# Patient Record
Sex: Male | Born: 2000 | Hispanic: Yes | Marital: Single | Smoking: Current every day smoker
Health system: Southern US, Community
[De-identification: ages and names within clinical notes are randomized; demographics above are authoritative.]

## PROBLEM LIST (undated history)

## (undated) DIAGNOSIS — F32A Depression, unspecified: Secondary | ICD-10-CM

---
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
## 2021-05-31 ENCOUNTER — Emergency Department
Admission: EM | Admit: 2021-05-31 | Discharge: 2021-05-31 | Disposition: A | Discharge status: Home / Self Care | Payer: Medicaid Other | Attending: Emergency Medicine | Admitting: Emergency Medicine

## 2021-05-31 ENCOUNTER — Other Ambulatory Visit: Payer: Self-pay

## 2021-05-31 DIAGNOSIS — F1910 Other psychoactive substance abuse, uncomplicated: Secondary | ICD-10-CM

## 2021-05-31 DIAGNOSIS — F1994 Other psychoactive substance use, unspecified with psychoactive substance-induced mood disorder: Secondary | ICD-10-CM | POA: Insufficient documentation

## 2021-05-31 DIAGNOSIS — R45851 Suicidal ideations: Secondary | ICD-10-CM | POA: Diagnosis not present

## 2021-05-31 DIAGNOSIS — Z20822 Contact with and (suspected) exposure to covid-19: Secondary | ICD-10-CM | POA: Insufficient documentation

## 2021-05-31 LAB — CBC
HCT: 45.7 % (ref 39.0–52.0)
Hemoglobin: 15.7 g/dL (ref 13.0–17.0)
MCH: 29.9 pg (ref 26.0–34.0)
MCHC: 34.4 g/dL (ref 30.0–36.0)
MCV: 87 fL (ref 80.0–100.0)
Platelets: 194 10*3/uL (ref 150–400)
RBC: 5.25 MIL/uL (ref 4.22–5.81)
RDW: 13.2 % (ref 11.5–15.5)
WBC: 6.7 10*3/uL (ref 4.0–10.5)
nRBC: 0 % (ref 0.0–0.2)

## 2021-05-31 LAB — URINALYSIS, COMPLETE (UACMP) WITH MICROSCOPIC
Bacteria, UA: NONE SEEN
Bilirubin Urine: NEGATIVE
Glucose, UA: NEGATIVE mg/dL
Hgb urine dipstick: NEGATIVE
Ketones, ur: NEGATIVE mg/dL
Leukocytes,Ua: NEGATIVE
Nitrite: NEGATIVE
Protein, ur: 30 mg/dL — AB
Specific Gravity, Urine: 1.019 (ref 1.005–1.030)
Squamous Epithelial / HPF: NONE SEEN (ref 0–5)
pH: 9 — ABNORMAL HIGH (ref 5.0–8.0)

## 2021-05-31 LAB — RESP PANEL BY RT-PCR (FLU A&B, COVID) ARPGX2
Influenza A by PCR: NEGATIVE
Influenza B by PCR: NEGATIVE
SARS Coronavirus 2 by RT PCR: NEGATIVE

## 2021-05-31 LAB — ACETAMINOPHEN LEVEL: Acetaminophen (Tylenol), Serum: <10 ug/mL — ABNORMAL LOW (ref 10–30)

## 2021-05-31 LAB — COMPREHENSIVE METABOLIC PANEL
ALT: 18 U/L (ref 0–44)
AST: 16 U/L (ref 15–41)
Albumin: 4.6 g/dL (ref 3.5–5.0)
Alkaline Phosphatase: 95 U/L (ref 38–126)
Anion gap: 11 (ref 5–15)
BUN: 13 mg/dL (ref 6–20)
CO2: 28 mmol/L (ref 22–32)
Calcium: 9.3 mg/dL (ref 8.9–10.3)
Chloride: 101 mmol/L (ref 98–111)
Creatinine, Ser: 0.83 mg/dL (ref 0.61–1.24)
GFR, Estimated: >60 mL/min (ref >60)
Glucose, Bld: 78 mg/dL (ref 70–99)
Potassium: 4.1 mmol/L (ref 3.5–5.1)
Sodium: 140 mmol/L (ref 135–145)
Total Bilirubin: 0.7 mg/dL (ref 0.3–1.2)
Total Protein: 7.4 g/dL (ref 6.5–8.1)

## 2021-05-31 LAB — ETHANOL: Alcohol, Ethyl (B): <10 mg/dL (ref <10)

## 2021-05-31 LAB — URINE DRUG SCREEN, QUALITATIVE (ARMC ONLY)
Amphetamines, Ur Screen: NOT DETECTED
Barbiturates, Ur Screen: NOT DETECTED
Benzodiazepine, Ur Scrn: NOT DETECTED
Cannabinoid 50 Ng, Ur ~~LOC~~: POSITIVE — AB
Cocaine Metabolite,Ur ~~LOC~~: NOT DETECTED
MDMA (Ecstasy)Ur Screen: NOT DETECTED
Methadone Scn, Ur: NOT DETECTED
Opiate, Ur Screen: NOT DETECTED
Phencyclidine (PCP) Ur S: NOT DETECTED
Tricyclic, Ur Screen: NOT DETECTED

## 2021-05-31 LAB — RAPID HIV SCREEN (HIV 1/2 AB+AG)
HIV 1/2 Antibodies: NONREACTIVE
HIV-1 P24 Antigen - HIV24: NONREACTIVE
Interpretation (HIV Ag Ab): NONREACTIVE

## 2021-05-31 LAB — SALICYLATE LEVEL: Salicylate Lvl: <7 mg/dL — ABNORMAL LOW (ref 7.0–30.0)

## 2021-05-31 NOTE — BH Assessment (Signed)
Comprehensive Clinical Assessment (CCA) Note  05/31/2021 Edward Shelton [id number] Recommendations for Services/Supports/Treatments: Pending psych consult/disposition.   Edward Shelton Edward Shelton is a 21 year old, English speaking, Hispanic male with no known psych hx. Pt presented to [facility] ED voluntarily to get medications for suspected bipolar symptoms. Pt explained that he'd presented to the ED due to worsening thoughts of SI. Pt reported that he'd considered jumping from a building prior to arrival which caused him great concern and catalyzed his decision to seek help. Pt described the thoughts as increasingly intrusive. On assessment, pt. admits to abusing Ketamine, acid, shrooms, molly, and MDMA as a form of self-medicating. Pt reported that he uses 1 gram of Ketamine approximately 1-2x peer week. Pt explained that his last use was 3 days ago 05/28/21, and that he'd been extremely depressed since his last use. Pt reported that he drinks alcohol and cannabis occasionally, explaining that he has a take it or leave it attitude. Pt identified having tremors when he does not use substances. Pt explained that he has a supportive family and girlfriend. Pt has stable housing, explaining that he rents a room from a friend. Pt had good insight into his substance abuse issues and expressed concerns about the ways in which ketamine abuse may have affected his bladder. Pt was cooperative throughout the assessment. Pt had clear and logical speech. Pt was oriented x4. Pt had an anxious mood and affect. The pt. did not appear to be responding to internal/external stimuli. The patient denied current SI, HI or AV/H.   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 Self  Referral name: No data  recorded Referral phone number: No data recorded  Whom do you see for routine medical problems? No data recorded Practice/Facility Name: No data recorded Practice/Facility Phone Number: No data recorded Name of Contact: No data recorded Contact Number: No data recorded Contact Fax Number: No data recorded Prescriber Name: No data recorded Prescriber Address (if known): No data recorded  What Is the Reason for Your Visit/Call Today? Pt states he thinks he is bipolar and is here to get "medication". Pt states he has been suicidal and has been using a "drug to cope that I think is messing up my bladder". Pt states has been taking ketamine.  How Long Has This Been Causing You Problems? > than 6 months  What Do You Feel Would Help You the Most Today? Treatment for Depression or other mood problem; Medication(s)   Have You Recently Been in Any Inpatient Treatment (Hospital/Detox/Crisis Center/28-Day Program)? No data recorded Name/Location of Program/Hospital:No data recorded How Long Were You There? No data recorded When Were You Discharged? No data recorded  Have You Ever Received Services From [facility] Before? No data recorded Who Do You See at [facility]? No data recorded  Have You Recently Had Any Thoughts About Hurting Yourself? Yes  Are You Planning to Commit Suicide/Harm Yourself At This time? No   Have you Recently Had Thoughts About Joice? No  Explanation: No data recorded  Have You Used Any Alcohol or Drugs in the Past 24 Hours? No  How Long Ago Did You Use Drugs or Alcohol? No data recorded What Did You Use and How Much? No data recorded  Do You Currently Have a Therapist/Psychiatrist? No  Name  of Therapist/Psychiatrist: No data recorded  Have You Been Recently Discharged From Any Office Practice or Programs? No  Explanation of Discharge From Practice/Program: No data recorded    CCA Screening Triage Referral Assessment Type of Contact:  Face-to-Face  Is this Initial or Reassessment? No data recorded Date Telepsych consult ordered in [facility]:  No data recorded Time Telepsych consult ordered in [facility]:  No data recorded  Patient Reported Information Reviewed? No data recorded Patient Left Without Being Seen? No data recorded Reason for Not Completing Assessment: No data recorded  Collateral Involvement: None provided   Does Patient Have a [place]? No data recorded Name and Contact of Legal Guardian: No data recorded If Minor and Not Living with Parent(s), Who has Custody? n/a  Is CPS involved or ever been involved? Never  Is APS involved or ever been involved? Never   Patient Determined To Be At Risk for Harm To Self or Others Based on Review of Patient Reported Information or Presenting Complaint? No  Method: No data recorded Availability of Means: No data recorded Intent: No data recorded Notification Required: No data recorded Additional Information for Danger to Others Potential: No data recorded Additional Comments for Danger to Others Potential: No data recorded Are There Guns or Other Weapons in Your Home? No data recorded Types of Guns/Weapons: No data recorded Are These Weapons Safely Secured?                            No data recorded Who Could Verify You Are Able To Have These Secured: No data recorded Do You Have any Outstanding Charges, Pending Court Dates, Parole/Probation? No data recorded Contacted To Inform of Risk of Harm To Self or Others: No data recorded  Location of Assessment: [facility] ED   Does Patient Present under Involuntary Commitment? No  IVC Papers Initial File Date: No data recorded  [place] of Residence: [place]   Patient Currently Receiving the Following Services: Not Receiving Services   Determination of Need: Emergent (2 hours)   Options For Referral: ED Referral; Therapeutic Triage Services     CCA Biopsychosocial Intake/Chief Complaint:  No  data recorded Current Symptoms/Problems: No data recorded  Patient Reported Schizophrenia/Schizoaffective Diagnosis in Past: No   Strengths: Pt has good insight; pt is willing to participate in treatment; pt has stable housing.  Preferences: No data recorded Abilities: No data recorded  Type of Services Patient Feels are Needed: No data recorded  Initial Clinical Notes/Concerns: No data recorded  Mental Health Symptoms Depression:   Tearfulness; Hopelessness; Sleep (too much or little)   Duration of Depressive symptoms:  Less than two weeks   Mania:   N/A   Anxiety:    Worrying; Tension   Psychosis:   None   Duration of Psychotic symptoms: No data recorded  Trauma:   Emotional numbing   Obsessions:   None   Compulsions:   "Driven" to perform behaviors/acts; Repeated behaviors/mental acts; Good insight; Intended to reduce stress or prevent another outcome; Disrupts with routine/functioning   Inattention:   None   Hyperactivity/Impulsivity:   None   Oppositional/Defiant Behaviors:   None   Emotional Irregularity:   Mood lability   Other Mood/Personality Symptoms:  No data recorded   Mental Status Exam Appearance and self-care  Stature:   Small   Weight:   Average weight   Clothing:   Casual   Grooming:   Normal   Cosmetic use:  None   Posture/gait:   Normal   Motor activity:   Not Remarkable   Sensorium  Attention:   Normal   Concentration:   Normal   Orientation:   Situation; Place; Person; Object   Recall/memory:   Normal   Affect and Mood  Affect:   Anxious   Mood:   Dysphoric   Relating  Eye contact:   Normal   Facial expression:   Responsive   Attitude toward examiner:   Cooperative   Thought and Language  Speech flow:  Clear and Coherent   Thought content:   Appropriate to Mood and Circumstances   Preoccupation:   None   Hallucinations:   None   Organization:  No data recorded  [employer] of Knowledge:   Average   Intelligence:   Average   Abstraction:   Normal   Judgement:   Fair   Reality Testing:   Adequate   Insight:   Good   Decision Making:   Impulsive   Social Functioning  Social Maturity:   Impulsive   Social Judgement:   Heedless   Stress  Stressors:   Other (Comment) (Substance abuse)   Coping Ability:   Exhausted   Skill Deficits:   Decision making   Supports:   Family; Support needed     Religion: Religion/Spirituality Are You A Religious Person?:  (Not assessed) How Might This Affect Treatment?: Not assessed  Leisure/Recreation: Leisure / Recreation Do You Have Hobbies?: No  Exercise/Diet: Exercise/Diet Do You Exercise?: No Have You Gained or Lost A Significant Amount of Weight in the Past Six Months?: No Do You Follow a Special Diet?: No Do You Have Any Trouble Sleeping?: No   CCA Employment/Education Employment/Work Situation: Employment / Work Situation Employment Situation: Employed Work Stressors: None Patient's Job has Been Impacted by Current Illness: No Has Patient ever Been in Passenger transport manager?: No  Education: Education Is Patient Currently Attending School?: No Last Grade Completed: 12 Did You Nutritional therapist?: No Did You Have An Individualized Education Program (IIEP): No Did You Have Any Difficulty At [employer]?: No Patient's Education Has Been Impacted by Current Illness: No   CCA Family/Childhood History Family and Relationship History: Family history Marital status: Single Does patient have children?: No  Childhood History:  Childhood History By whom was/is the patient raised?: Both parents Did patient suffer any verbal/emotional/physical/sexual abuse as a child?: No Did patient suffer from severe childhood neglect?: No Has patient ever been sexually abused/assaulted/raped as an adolescent or adult?: No Was the patient ever a victim of a crime or a disaster?: No Witnessed  domestic violence?: No Has patient been affected by domestic violence as an adult?: No  Child/Adolescent Assessment:     CCA Substance Use Alcohol/Drug Use: Alcohol / Drug Use Pain Medications: See MAR Prescriptions: See MAR Over the Counter: See MAR History of alcohol / drug use?: Yes Longest period of sobriety (when/how long): Unknown Negative Consequences of Use: Personal relationships Withdrawal Symptoms: Tremors Substance #1 Name of Substance 1: Ketamine 1 - Age of First Use: 19 1 - Amount (size/oz): 1 gram 1 - Frequency: 1-2x per week 1 - Duration: UTA 1 - Last Use / Amount: 05/28/21 Substance #2 Name of Substance 2: Acid 2 - Age of First Use: 16 1/2 2 - Amount (size/oz): Unknown 2 - Frequency: Depends Substance #3 Name of Substance 3: Molly 3 - Age of First Use: UTA 3 - Amount (size/oz): UTA 3 - Frequency: Unknown 3 -  Duration: Unknown 3 - Last Use / Amount: Unknown Substance #4 Name of Substance 4: MDMA 4 - Age of First Use: Unknown 4 - Amount (size/oz): "I abused the fuck out of those" 4 - Frequency: Unknown 4 - Duration: Unknown 4 - Last Use / Amount: Unknown 4 - Method of Aquiring: Unknown 4 - Route of Substance Use: Unknown                 ASAM's:  Six Dimensions of Multidimensional Assessment  Dimension 1:  Acute Intoxication and/or Withdrawal Potential:   Dimension 1:  Description of individual's past and current experiences of substance use and withdrawal: Pt has a hx of polysubstance abuse  Dimension 2:  Biomedical Conditions and Complications:   Dimension 2:  Description of patient's biomedical conditions and  complications: Pt reported physical complications with his bladder due to Ketamine use  Dimension 3:  Emotional, Behavioral, or Cognitive Conditions and Complications:  Dimension 3:  Description of emotional, behavioral, or cognitive conditions and complications: Pt reported emotional lability due to substance abuse  Dimension 4:   Readiness to Change:     Dimension 5:  Relapse, Continued use, or Continued Problem Potential:     Dimension 6:  Recovery/Living Environment:     ASAM Severity Score: ASAM's Severity Rating Score: 20  ASAM Recommended Level of Treatment: ASAM Recommended Level of Treatment: Level III Residential Treatment   Substance use Disorder (SUD) Substance Use Disorder (SUD)  Checklist Symptoms of Substance Use: Continued use despite persistent or recurrent social, interpersonal problems, caused or exacerbated by use, Continued use despite having a persistent/recurrent physical/psychological problem caused/exacerbated by use, Evidence of tolerance, Evidence of withdrawal (Comment), Large amounts of time spent to obtain, use or recover from the substance(s), Persistent desire or unsuccessful efforts to cut down or control use  Recommendations for Services/Supports/Treatments: Recommendations for Services/Supports/Treatments Recommendations For Services/Supports/Treatments: Inpatient Hospitalization  DSM5 Diagnoses: There are no problems to display for this patient.   [place]

## 2021-05-31 NOTE — ED Notes (Signed)
Lab is contacted at this time for add on orders, states that they can complete all.

## 2021-05-31 NOTE — ED Notes (Signed)
Pt reports that he is having intrusive SI, no plan when asked. Reports today he was walking beside a building and thought of going to top and jumping but was able to stop thought. Pt reports this occurring in past and dx of bipolar. Pt is cooperative and seeking help, reports not taking any meds in past that were prescribed. Pt self medicates with many meds including ketamine and mushrooms.

## 2021-05-31 NOTE — Discharge Instructions (Signed)
Please seek medical attention and help for any thoughts about wanting to harm yourself, harm others, any concerning change in behavior, severe depression, inappropriate drug use or any other new or concerning symptoms. ° °

## 2021-05-31 NOTE — ED Triage Notes (Signed)
Pt states he thinks he is bipolar and is here to get "medication". Pt states he has been suicidal and has been using a "drug to cope that I think is messing up my bladder". Pt states has been taking ketamine.

## 2021-05-31 NOTE — ED Notes (Signed)
Patient transferred from Triage to room 19 H  after dressing out and screening for contraband. Report received from RN including situation, background, assessment and recommendations. Pt oriented to [employer] including Q15 minute rounds as well as Psychologist, counselling for their protection. Patient is alert and oriented, warm and dry in no acute distress. Patient denies HI, and AVH. Pt. Encouraged to let this nurse know if needs arise.

## 2021-05-31 NOTE — ED Notes (Signed)
Pt dressed out the following items placed in one of one labeled bag: black sneakers, phone, grey shorts, wallet, keys, black socks, red bracelet, black jacket, yellow metal chain necklace, black t shirt, black boxers. Ed Veterinary surgeon took belonging to quad with pt.

## 2021-05-31 NOTE — ED Provider Notes (Signed)
[facility] Provider Note    Event Date/Time   First MD Initiated Contact with Patient 05/31/21 2155     (approximate)   History   Psychiatric Evaluation and Suicidal   HPI  Edward Shelton is a 21 y.o. male  who  presents to the emergency department today because of concern for suicidal ideation.  Patient states he has been having suicidal thoughts for the past roughly 3 days.  He has not acted on any of these thoughts.  He states that he was told he had bipolar was supposed to be put on medication roughly a year ago from primary care although he never did that.  York Spaniel he has chosen to self medicate with recreational drugs.  States last used about 5 days ago.  Denies any recent medical complaints.   Physical Exam   Triage Vital Signs: ED Triage Vitals  Enc Vitals Group     BP 05/31/21 2055 129/83     Pulse Rate 05/31/21 2055 78     Resp 05/31/21 2055 16     Temp 05/31/21 2055 99 F (37.2 C)     Temp Source 05/31/21 2055 Oral     SpO2 05/31/21 2055 100 %     Weight 05/31/21 2055 120 lb (54.4 kg)     Height 05/31/21 2055 5\' 4"  (1.626 m)     Head Circumference --      Peak Flow --      Pain Score 05/31/21 2127 0     Pain Loc --      Pain Edu? --      Excl. in GC? --     Most recent vital signs: Vitals:   05/31/21 2055  BP: 129/83  Pulse: 78  Resp: 16  Temp: 99 F (37.2 C)  SpO2: 100%    General: Awake, no distress.  CV:  Good peripheral perfusion.  Resp:  Normal effort.  Abd:  No distention.  Psych:  Calm. Good insight. 3   ED Results / Procedures / Treatments   Labs (all labs ordered are listed, but only abnormal results are displayed) Labs Reviewed  URINE DRUG SCREEN, QUALITATIVE ([facility] ONLY) - Abnormal; Notable for the following components:      Result Value   Cannabinoid 50 Ng, Ur [place] POSITIVE (*)    All other components within normal limits  RESP PANEL BY RT-PCR (FLU A&B, COVID) ARPGX2  COMPREHENSIVE METABOLIC PANEL   CBC  ETHANOL  SALICYLATE LEVEL  ACETAMINOPHEN LEVEL     EKG  None   RADIOLOGY None   PROCEDURES:  Critical Care performed: No  Procedures   MEDICATIONS ORDERED IN ED: Medications - No data to display   IMPRESSION / MDM / ASSESSMENT AND PLAN / ED COURSE  I reviewed the triage vital signs and the nursing notes.                              Differential diagnosis includes, but is not limited to, psychiatric illness, drug induced mood disorder.  Patient presented to the emergency department today because of concerns for suicidal ideation. patient has good insight.  Blood work without any concerning abnormalities.  Will have psych and TTS evaluate.  The patient has been placed in psychiatric observation due to the need to provide a safe environment for the patient while obtaining psychiatric consultation and evaluation, as well as ongoing medical and medication management to treat the  patient's condition.  The patient has not been placed under full IVC at this time.    FINAL CLINICAL IMPRESSION(S) / ED DIAGNOSES   Final diagnoses:  Suicidal thoughts     Note:  This document was prepared using Dragon voice recognition software and may include unintentional dictation errors.    [name], MD 05/31/21 [id number]

## 2021-06-01 LAB — CHLAMYDIA/NGC RT PCR (ARMC ONLY)
Chlamydia Tr: NOT DETECTED
N gonorrhoeae: NOT DETECTED

## 2021-06-03 ENCOUNTER — Other Ambulatory Visit: Payer: Self-pay

## 2021-06-03 ENCOUNTER — Emergency Department (EMERGENCY_DEPARTMENT_HOSPITAL)
Admission: EM | Admit: 2021-06-03 | Discharge: 2021-06-03 | Disposition: A | Discharge status: Other Acute Inpatient Hospital | Payer: Medicaid Other | Source: Home / Self Care | Attending: Emergency Medicine | Admitting: Emergency Medicine

## 2021-06-03 ENCOUNTER — Inpatient Hospital Stay
Admission: AD | Admit: 2021-06-03 | Discharge: 2021-06-05 | DRG: 897 | Disposition: A | Discharge status: Home / Self Care | Payer: Medicaid Other | Source: Intra-hospital | Attending: Psychiatry | Admitting: Psychiatry

## 2021-06-03 DIAGNOSIS — Y900 Blood alcohol level of less than 20 mg/100 ml: Secondary | ICD-10-CM | POA: Insufficient documentation

## 2021-06-03 DIAGNOSIS — F1910 Other psychoactive substance abuse, uncomplicated: Secondary | ICD-10-CM | POA: Diagnosis not present

## 2021-06-03 DIAGNOSIS — F1914 Other psychoactive substance abuse with psychoactive substance-induced mood disorder: Principal | ICD-10-CM | POA: Diagnosis present

## 2021-06-03 DIAGNOSIS — F1210 Cannabis abuse, uncomplicated: Secondary | ICD-10-CM | POA: Diagnosis present

## 2021-06-03 DIAGNOSIS — R45851 Suicidal ideations: Secondary | ICD-10-CM | POA: Diagnosis present

## 2021-06-03 DIAGNOSIS — F331 Major depressive disorder, recurrent, moderate: Secondary | ICD-10-CM | POA: Insufficient documentation

## 2021-06-03 DIAGNOSIS — F1919 Other psychoactive substance abuse with unspecified psychoactive substance-induced disorder: Secondary | ICD-10-CM | POA: Insufficient documentation

## 2021-06-03 DIAGNOSIS — F39 Unspecified mood [affective] disorder: Secondary | ICD-10-CM | POA: Diagnosis present

## 2021-06-03 DIAGNOSIS — Z20822 Contact with and (suspected) exposure to covid-19: Secondary | ICD-10-CM | POA: Diagnosis present

## 2021-06-03 DIAGNOSIS — F1610 Hallucinogen abuse, uncomplicated: Secondary | ICD-10-CM | POA: Diagnosis present

## 2021-06-03 DIAGNOSIS — F32A Depression, unspecified: Secondary | ICD-10-CM | POA: Diagnosis present

## 2021-06-03 DIAGNOSIS — F322 Major depressive disorder, single episode, severe without psychotic features: Secondary | ICD-10-CM | POA: Diagnosis not present

## 2021-06-03 DIAGNOSIS — F1994 Other psychoactive substance use, unspecified with psychoactive substance-induced mood disorder: Secondary | ICD-10-CM

## 2021-06-03 DIAGNOSIS — F419 Anxiety disorder, unspecified: Secondary | ICD-10-CM | POA: Diagnosis present

## 2021-06-03 LAB — RESP PANEL BY RT-PCR (FLU A&B, COVID) ARPGX2
Influenza A by PCR: NEGATIVE
Influenza B by PCR: NEGATIVE
SARS Coronavirus 2 by RT PCR: NEGATIVE

## 2021-06-03 LAB — URINE DRUG SCREEN, QUALITATIVE (ARMC ONLY)
Amphetamines, Ur Screen: NOT DETECTED
Barbiturates, Ur Screen: NOT DETECTED
Benzodiazepine, Ur Scrn: NOT DETECTED
Cannabinoid 50 Ng, Ur ~~LOC~~: POSITIVE — AB
Cocaine Metabolite,Ur ~~LOC~~: NOT DETECTED
MDMA (Ecstasy)Ur Screen: NOT DETECTED
Methadone Scn, Ur: NOT DETECTED
Opiate, Ur Screen: NOT DETECTED
Phencyclidine (PCP) Ur S: NOT DETECTED
Tricyclic, Ur Screen: NOT DETECTED

## 2021-06-03 LAB — COMPREHENSIVE METABOLIC PANEL
ALT: 18 U/L (ref 0–44)
AST: 20 U/L (ref 15–41)
Albumin: 5.1 g/dL — ABNORMAL HIGH (ref 3.5–5.0)
Alkaline Phosphatase: 103 U/L (ref 38–126)
Anion gap: 7 (ref 5–15)
BUN: 10 mg/dL (ref 6–20)
CO2: 25 mmol/L (ref 22–32)
Calcium: 9.6 mg/dL (ref 8.9–10.3)
Chloride: 104 mmol/L (ref 98–111)
Creatinine, Ser: 0.77 mg/dL (ref 0.61–1.24)
GFR, Estimated: >60 mL/min (ref >60)
Glucose, Bld: 79 mg/dL (ref 70–99)
Potassium: 3.7 mmol/L (ref 3.5–5.1)
Sodium: 136 mmol/L (ref 135–145)
Total Bilirubin: 1.5 mg/dL — ABNORMAL HIGH (ref 0.3–1.2)
Total Protein: 8.4 g/dL — ABNORMAL HIGH (ref 6.5–8.1)

## 2021-06-03 LAB — CBC
HCT: 47.3 % (ref 39.0–52.0)
Hemoglobin: 16.7 g/dL (ref 13.0–17.0)
MCH: 30.4 pg (ref 26.0–34.0)
MCHC: 35.3 g/dL (ref 30.0–36.0)
MCV: 86 fL (ref 80.0–100.0)
Platelets: 212 10*3/uL (ref 150–400)
RBC: 5.5 MIL/uL (ref 4.22–5.81)
RDW: 13 % (ref 11.5–15.5)
WBC: 8.6 10*3/uL (ref 4.0–10.5)
nRBC: 0 % (ref 0.0–0.2)

## 2021-06-03 LAB — ACETAMINOPHEN LEVEL: Acetaminophen (Tylenol), Serum: <10 ug/mL — ABNORMAL LOW (ref 10–30)

## 2021-06-03 LAB — ETHANOL: Alcohol, Ethyl (B): <10 mg/dL (ref <10)

## 2021-06-03 LAB — SALICYLATE LEVEL: Salicylate Lvl: <7 mg/dL — ABNORMAL LOW (ref 7.0–30.0)

## 2021-06-03 MED ORDER — MAGNESIUM HYDROXIDE 400 MG/5ML PO SUSP: 30.0000 mL | Freq: Every day | ORAL | Status: DC | PRN

## 2021-06-03 MED ORDER — ALUM & MAG HYDROXIDE-SIMETH 200-200-20 MG/5ML PO SUSP: 30.0000 mL | ORAL | Status: DC | PRN

## 2021-06-03 MED ORDER — ACETAMINOPHEN 325 MG PO TABS
650.0000 mg | ORAL_TABLET | Freq: Four times a day (QID) | ORAL | Status: DC | PRN
Start: 2021-06-03 — End: 2021-06-05

## 2021-06-03 MED ORDER — TRAZODONE HCL 50 MG PO TABS: 50.0000 mg | ORAL_TABLET | Freq: Every evening | ORAL | Status: DC | PRN

## 2021-06-03 NOTE — ED Notes (Addendum)
Pt. Transferred to BHU , room# 1 from Triage .Patient was screened by security before entering the unit. Report to include Situation, Background, Assessment and Recommendations from ED RN . Pt. Oriented to unit including Q15 minute rounds as well as the security cameras for their protection. Patient is alert and oriented, warm and dry in no acute distress.   ?

## 2021-06-03 NOTE — BH Assessment (Signed)
Comprehensive Clinical Assessment (CCA) Note  06/03/2021 Edward Shelton [id number]  Chief Complaint: Patient is a 21 year old male presenting to [facility] ED voluntarily due to depression. Per triage note Pt states he is having suicidal thoughts with no plan, states "I feel like Im bipolar' states he has strange thoughts, "how does that work" another time he had thoughts of bashing his head into something but did not act on it, states he is just have strange thought, states he was just here for same and followed up with [facility] but they couldn't see him until Friday and didn't feel like he could wait that long. During assessment patient appears alert and oriented x4, calm and cooperative, mood appears depressed. Patient reports having suicidal thoughts "It's just a thought I don't want to do it." Patient reports continues substance use and reports that he feels like he doesn't have a problem with his use. 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 Patient also reports continues use of hallucinogens such as Mushrooms and Ketamine. Patient UDS is positive for Cannabinoids. 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 Self  Referral name: No data recorded Referral phone number: No data recorded  Whom do you see for routine medical problems? No data recorded Practice/Facility Name: No data recorded Practice/Facility Phone Number: No data recorded Name of Contact: No data  recorded Contact Number: No data recorded Contact Fax Number: No data recorded Prescriber Name: No data recorded Prescriber Address (if known): No data recorded  What Is the Reason for Your Visit/Call Today? Patient presents voluntarily due to depression  How Long Has This Been Causing You Problems? > than 6 months  What Do You Feel Would Help You the Most Today? Treatment for Depression or other mood problem   Have You Recently Been in Any Inpatient Treatment (Hospital/Detox/Crisis Center/28-Day Program)? No data recorded Name/Location of Program/Hospital:No data recorded How Long Were You There? No data recorded When Were You Discharged? No data recorded  Have You Ever Received Services From [facility] Before? No data recorded Who Do You See at [facility]? No data recorded  Have You Recently Had Any Thoughts About Hurting Yourself? Yes  Are You Planning to Commit Suicide/Harm Yourself At This time? No   Have you Recently Had Thoughts About Hurting Someone [name]? No  Explanation: No data recorded  Have You Used Any Alcohol or Drugs in the Past 24 Hours? Yes  How Long Ago Did You Use Drugs or Alcohol? No data recorded What Did You Use and How Much? "Mushrooms"   Do You Currently Have a Therapist/Psychiatrist? No  Name of Therapist/Psychiatrist: No data recorded  Have You Been Recently Discharged From Any Office Practice or Programs? No  Explanation of Discharge From Practice/Program: No data recorded    CCA Screening Triage Referral Assessment Type of Contact: Face-to-Face  Is this Initial or Reassessment? No data recorded Date Telepsych consult ordered in [facility]:  No data recorded Time Telepsych consult ordered in [facility]:  No  data recorded  Patient Reported Information Reviewed? No data recorded Patient Left Without Being Seen? No data recorded Reason for Not Completing Assessment: No data recorded  Collateral Involvement: None provided   Does Patient Have a  Court Appointed Legal Guardian? No data recorded Name and Contact of Legal Guardian: No data recorded If Minor and Not Living with Parent(s), Who has Custody? n/a  Is CPS involved or ever been involved? Never  Is APS involved or ever been involved? Never   Patient Determined To Be At Risk for Harm To Self or Others Based on Review of Patient Reported Information or Presenting Complaint? No  Method: No data recorded Availability of Means: No data recorded Intent: No data recorded Notification Required: No data recorded Additional Information for Danger to Others Potential: No data recorded Additional Comments for Danger to Others Potential: No data recorded Are There Guns or Other Weapons in Your Home? No data recorded Types of Guns/Weapons: No data recorded Are These Weapons Safely Secured?                            No data recorded Who Could Verify You Are Able To Have These Secured: No data recorded Do You Have any Outstanding Charges, Pending Court Dates, Parole/Probation? No data recorded Contacted To Inform of Risk of Harm To Self or Others: No data recorded  Location of Assessment: [facility] ED   Does Patient Present under Involuntary Commitment? No  IVC Papers Initial File Date: No data recorded  [place] of Residence: [place]   Patient Currently Receiving the Following Services: Not Receiving Services   Determination of Need: Emergent (2 hours)   Options For Referral: ED Referral; Therapeutic Triage Services     CCA Biopsychosocial Intake/Chief Complaint:  No data recorded Current Symptoms/Problems: No data recorded  Patient Reported Schizophrenia/Schizoaffective Diagnosis in Past: No   Strengths: Pt has good insight; pt is willing to participate in treatment; pt has stable housing.  Preferences: No data recorded Abilities: No data recorded  Type of Services Patient Feels are Needed: No data recorded  Initial Clinical Notes/Concerns: No data  recorded  Mental Health Symptoms Depression:   Tearfulness; Hopelessness; Sleep (too much or little); Change in energy/activity   Duration of Depressive symptoms:  Greater than two weeks   Mania:   N/A   Anxiety:    Worrying; Tension   Psychosis:   None   Duration of Psychotic symptoms: No data recorded  Trauma:   Emotional numbing   Obsessions:   None   Compulsions:   Good insight; Intended to reduce stress or prevent another outcome   Inattention:   None   Hyperactivity/Impulsivity:   None   Oppositional/Defiant Behaviors:   None   Emotional Irregularity:   Chronic feelings of emptiness   Other Mood/Personality Symptoms:  No data recorded   Mental Status Exam Appearance and self-care  Stature:   Small   Weight:   Average weight   Clothing:   Casual   Grooming:   Normal   Cosmetic use:   None   Posture/gait:   Normal   Motor activity:   Not Remarkable   Sensorium  Attention:   Normal   Concentration:   Normal   Orientation:   Situation; Place; Person; Object   Recall/memory:   Normal   Affect and Mood  Affect:   Depressed   Mood:   Depressed   Relating  Eye contact:   Normal  Facial expression:   Responsive   Attitude toward examiner:   Cooperative   Thought and Language  Speech flow:  Clear and Coherent   Thought content:   Appropriate to Mood and Circumstances   Preoccupation:   None   Hallucinations:   None   Organization:  No data recorded  [employer] of Knowledge:   Average   Intelligence:   Average   Abstraction:   Normal   Judgement:   Fair   Reality Testing:   Adequate   Insight:   Good   Decision Making:   Normal   Social Functioning  Social Maturity:   Isolates   Social Judgement:   Normal   Stress  Stressors:   Other (Comment) (Substance abuse)   Coping Ability:   Exhausted   Skill Deficits:   None   Supports:   Family; Support needed      Religion: Religion/Spirituality Are You A Religious Person?:  (Not assessed) How Might This Affect Treatment?: Not assessed  Leisure/Recreation: Leisure / Recreation Do You Have Hobbies?: No  Exercise/Diet: Exercise/Diet Do You Exercise?: No Have You Gained or Lost A Significant Amount of Weight in the Past Six Months?: No Do You Follow a Special Diet?: No Do You Have Any Trouble Sleeping?: No   CCA Employment/Education Employment/Work Situation: Employment / Work Situation Employment Situation: Employed Work Stressors: None Patient's Job has Been Impacted by Current Illness: No Has Patient ever Been in Equities trader?: No  Education: Education Last Grade Completed: 12 Did You Product manager?: No Did You Have An Individualized Education Program (IIEP): No Did You Have Any Difficulty At [employer]?: No   CCA Family/Childhood History Family and Relationship History: Family history Marital status: Single Does patient have children?: No  Childhood History:  Childhood History By whom was/is the patient raised?: Both parents Did patient suffer any verbal/emotional/physical/sexual abuse as a child?: No Has patient ever been sexually abused/assaulted/raped as an adolescent or adult?: No Witnessed domestic violence?: No Has patient been affected by domestic violence as an adult?: No  Child/Adolescent Assessment:     CCA Substance Use Alcohol/Drug Use: Alcohol / Drug Use Pain Medications: See MAR Prescriptions: See MAR Over the Counter: See MAR History of alcohol / drug use?: Yes Longest period of sobriety (when/how long): Unknown Negative Consequences of Use: Personal relationships Withdrawal Symptoms: Tremors Substance #1 Name of Substance 1: Ketamine 1 - Age of First Use: 19 1 - Amount (size/oz): 1 gram 1 - Frequency: 1-2x a week 1 - Duration: UTA 1 - Last Use / Amount: 06/03/21 Substance #2 Name of Substance 2: Acid 2 - Age of First Use: 16 1/2 2 -  Amount (size/oz): Unknown 2 - Frequency: Depends Substance #3 Name of Substance 3: Molly 3 - Age of First Use: UTA 3 - Amount (size/oz): UTA 3 - Frequency: Unknown 3 - Last Use / Amount: Unknown Substance #4 Name of Substance 4: MDMA 4 - Age of First Use: Unknown 4 - Amount (size/oz): "I abused the fuck out of those" 4 - Frequency: Unknown 4 - Duration: Unknown 4 - Last Use / Amount: Unknown                 ASAM's:  Six Dimensions of Multidimensional Assessment  Dimension 1:  Acute Intoxication and/or Withdrawal Potential:   Dimension 1:  Description of individual's past and current experiences of substance use and withdrawal: Pt has a hx of polysubstance abuse  Dimension 2:  Biomedical Conditions and  Complications:   Dimension 2:  Description of patient's biomedical conditions and  complications: Pt reported physical complications with his bladder due to Ketamine use  Dimension 3:  Emotional, Behavioral, or Cognitive Conditions and Complications:  Dimension 3:  Description of emotional, behavioral, or cognitive conditions and complications: Pt reported emotional lability due to substance abuse  Dimension 4:  Readiness to Change:     Dimension 5:  Relapse, Continued use, or Continued Problem Potential:     Dimension 6:  Recovery/Living Environment:     ASAM Severity Score: ASAM's Severity Rating Score: 20  ASAM Recommended Level of Treatment: ASAM Recommended Level of Treatment: Level III Residential Treatment   Substance use Disorder (SUD) Substance Use Disorder (SUD)  Checklist Symptoms of Substance Use: Continued use despite persistent or recurrent social, interpersonal problems, caused or exacerbated by use, Continued use despite having a persistent/recurrent physical/psychological problem caused/exacerbated by use, Evidence of tolerance, Evidence of withdrawal (Comment), Large amounts of time spent to obtain, use or recover from the substance(s), Persistent desire or  unsuccessful efforts to cut down or control use  Recommendations for Services/Supports/Treatments: Recommendations for Services/Supports/Treatments Recommendations For Services/Supports/Treatments: Inpatient Hospitalization  DSM5 Diagnoses: There are no problems to display for this patient.   Patient Centered Plan: Patient is on the following Treatment Plan(s):  Depression   Referrals to Alternative Service(s): Referred to Alternative Service(s):   Place:   Date:   Time:    Referred to Alternative Service(s):   Place:   Date:   Time:    Referred to Alternative Service(s):   Place:   Date:   Time:    Referred to Alternative Service(s):   Place:   Date:   Time:      @BHCOLLABOFCARE @  Owens CorningJamila A Caidan Hubbert, LCAS-A

## 2021-06-03 NOTE — ED Triage Notes (Signed)
Pt states he is having suicidal thoughts with no plan, states "I feel like Im bipolar' states he has strange thoughts, "how does that work" another time he had thoughts of bashing his head into something but did not act on it, states he is just have strange thought, states he was just here for same and followed up with [facility] but they couldn't see him until Friday and didn't feel like he could wait that long ?

## 2021-06-03 NOTE — ED Notes (Signed)
Report given to [facility] in the [facility].  ?

## 2021-06-03 NOTE — Consult Note (Cosign Needed)
[facility]to[facility] Consult   Reason for Consult:  Referring Physician:  Patient Identification: Edward Shelton MRN:  [id number] Principal Diagnosis: <principal problem not specified> Diagnosis:  Active Problems:   Depression   Polysubstance abuse (HCC)   Total Time spent with patient: 1 hour  Subjective: "I felt I need to come in today to get help." Edward Shelton is a 21 y.o. male patient [facility] to [facility] ED via POV voluntarily due to depression. Per the triage nurse's note, the patient states he is having suicidal thoughts with no plan, states "I feel like Im bipolar' states he has strange thoughts, "how does that work" another time he had thoughts of bashing his head into something but did not act on it, states he is just have strange thought, states he was just here for same and followed up with [facility] but they couldn't see him until Friday and didn't feel like he could wait that long. The patient was seen a couple of nights ago and voiced, "Pt explained that he'd presented to the ED due to worsening thoughts of SI. Pt reported that he'd considered jumping from a building prior to arrival which caused him great concern and catalyzed his decision to seek help. Pt described the thoughts as increasingly intrusive. On assessment, pt. admits to abusing Ketamine, acid, shrooms, molly, and MDMA as a form of self-medicating. Pt reported that he uses 1 gram of Ketamine approximately 1-2x peer week." This provider saw the patient face-to-face; the chart was reviewed and consulted with Dr. ED providers and Dr. Marisa SeverinSiadecki on 06/03/2021 due to the patient's care. It was discussed with the EDP that the patient does meet the criteria to be admitted to the psychiatric inpatient unit.  On evaluation, the patient is alert and oriented x4, calm, depressed but cooperative, and mood-congruent with affect.  The patient does not appear to be responding to internal or external stimuli. Neither is the  patient presenting with any delusional thinking. The patient denies auditory or visual hallucinations. The patient admits to passive suicidal ideation but denies homicidal or self-harm ideations. The patient is not presenting with any psychotic or paranoid behaviors.   HPI: Per Dr. Marisa SeverinSiadecki, Edward Shelton is a 21 y.o. male with a history of substance abuse who presents with suicidal ideation and anxiety.  The patient is a somewhat disorganized historian but states that he was seen in the ED for similar complaints a few days ago.  [facility] and went for the intake today, but states that he would not be able to be seen until the end of the week, and stated that he cannot wait this long.  He states he is not on any medications for mental health.  He states that he has been using shrooms but denies other active drug use or alcohol.  He denies any specific suicidal plan or attempts.  Past Psychiatric History:   Risk to Self:   Risk to Others:   Prior Inpatient Therapy:   Prior Outpatient Therapy:    Past Medical History: History reviewed. No pertinent past medical history. History reviewed. No pertinent surgical history. Family History:  Family History  Problem Relation Age of Onset   Healthy Mother    Healthy Father    Family Psychiatric  History: History reviewed. No pertinent family psychiatric history Social History:  Social History   Substance and Sexual Activity  Alcohol Use Yes     Social History   Substance and Sexual Activity  Drug Use Yes   Types: Marijuana   Comment: shrooms, MDMA, ketamine    Social History   Socioeconomic History   Marital status: Single    Spouse name: Not on file   Number of children: Not on file   Years of education: Not on file   Highest education level: Not on file  Occupational History   Not on file  Tobacco Use   Smoking status: Never   Smokeless tobacco: Never  Vaping Use   Vaping Use: Some days  Substance and Sexual  Activity   Alcohol use: Yes   Drug use: Yes    Types: Marijuana    Comment: shrooms, MDMA, ketamine   Sexual activity: Yes  Other Topics Concern   Not on file  Social History Narrative   Not on file   Social Determinants of Health   Financial Resource Strain: Not on file  Food Insecurity: Not on file  Transportation Needs: Not on file  Physical Activity: Not on file  Stress: Not on file  Social Connections: Not on file   Additional Social History:    Allergies:  No Known Allergies  Labs:  Results for orders placed or performed during the hospital encounter of 06/03/21 (from the past 48 hour(s))  Comprehensive metabolic panel     Status: Abnormal   Collection Time: 06/03/21  4:42 PM  Result Value Ref Range   Sodium 136 135 - 145 mmol/L   Potassium 3.7 3.5 - 5.1 mmol/L   Chloride 104 98 - 111 mmol/L   CO2 25 22 - 32 mmol/L   Glucose, Bld 79 70 - 99 mg/dL    Comment: Glucose reference range applies only to samples taken after fasting for at least 8 hours.   BUN 10 6 - 20 mg/dL   Creatinine, Ser 1.610.77 0.61 - 1.24 mg/dL   Calcium 9.6 8.9 - 09.610.3 mg/dL   Total Protein 8.4 (H) 6.5 - 8.1 g/dL   Albumin 5.1 (H) 3.5 - 5.0 g/dL   AST 20 15 - 41 U/L   ALT 18 0 - 44 U/L   Alkaline Phosphatase 103 38 - 126 U/L   Total Bilirubin 1.5 (H) 0.3 - 1.2 mg/dL   GFR, Estimated >04>60 >54>60 mL/min    Comment: (NOTE) Calculated using the CKD-EPI Creatinine Equation (2021)    Anion gap 7 5 - 15    Comment: Performed at [facility], [address] [place], [place] [address]  Ethanol     Status: None   Collection Time: 06/03/21  4:42 PM  Result Value Ref Range   Alcohol, Ethyl (B) <10 <10 mg/dL    Comment: (NOTE) Lowest detectable limit for serum alcohol is 10 mg/dL.  For medical purposes only. Performed at [facility], [address] [place], [place] [address]   Salicylate level     Status: Abnormal   Collection Time: 06/03/21  4:42 PM  Result Value Ref Range    Salicylate Lvl <7.0 (L) 7.0 - 30.0 mg/dL    Comment: Performed at [facility], [address] [place], [place] [address]  Acetaminophen level     Status: Abnormal   Collection Time: 06/03/21  4:42 PM  Result Value Ref Range   Acetaminophen (Tylenol), Serum <10 (L) 10 - 30 ug/mL    Comment: (NOTE) Therapeutic concentrations vary significantly. A range of 10-30 ug/mL  may be an effective concentration for many patients. However, some  are best treated at concentrations outside of this range.  Acetaminophen concentrations >150 ug/mL at 4 hours after ingestion  and >50 ug/mL at 12 hours after ingestion are often associated with  toxic reactions.  Performed at [facility], [address] [place], [place] [address]   cbc     Status: None   Collection Time: 06/03/21  4:42 PM  Result Value Ref Range   WBC 8.6 4.0 - 10.5 K/uL   RBC 5.50 4.22 - 5.81 MIL/uL   Hemoglobin 16.7 13.0 - 17.0 g/dL   HCT 33.5 45.6 - 25.6 %   MCV 86.0 80.0 - 100.0 fL   MCH 30.4 26.0 - 34.0 pg   MCHC 35.3 30.0 - 36.0 g/dL   RDW 38.9 37.3 - 42.8 %   Platelets 212 150 - 400 K/uL   nRBC 0.0 0.0 - 0.2 %    Comment: Performed at [facility], [address] [place], [place] [address]  Urine Drug Screen, Qualitative     Status: Abnormal   Collection Time: 06/03/21  4:42 PM  Result Value Ref Range   Tricyclic, Ur Screen NONE DETECTED NONE DETECTED   Amphetamines, Ur Screen NONE DETECTED NONE DETECTED   MDMA (Ecstasy)Ur Screen NONE DETECTED NONE DETECTED   Cocaine Metabolite,Ur [place] NONE DETECTED NONE DETECTED   Opiate, Ur Screen NONE DETECTED NONE DETECTED   Phencyclidine (PCP) Ur S NONE DETECTED NONE DETECTED   Cannabinoid 50 Ng, Ur [place] POSITIVE (A) NONE DETECTED   Barbiturates, Ur Screen NONE DETECTED NONE DETECTED   Benzodiazepine, Ur Scrn NONE DETECTED NONE DETECTED   Methadone Scn, Ur NONE DETECTED NONE DETECTED    Comment: (NOTE) Tricyclics + metabolites, urine    Cutoff 1000  ng/mL Amphetamines + metabolites, urine  Cutoff 1000 ng/mL MDMA (Ecstasy), urine              Cutoff 500 ng/mL Cocaine Metabolite, urine          Cutoff 300 ng/mL Opiate + metabolites, urine        Cutoff 300 ng/mL Phencyclidine (PCP), urine         Cutoff 25 ng/mL Cannabinoid, urine                 Cutoff 50 ng/mL Barbiturates + metabolites, urine  Cutoff 200 ng/mL Benzodiazepine, urine              Cutoff 200 ng/mL Methadone, urine                   Cutoff 300 ng/mL  The urine drug screen provides only a preliminary, unconfirmed analytical test result and should not be used for non-medical purposes. Clinical consideration and professional judgment should be applied to any positive drug screen result due to possible interfering substances. A more specific alternate chemical method must be used in order to obtain a confirmed analytical result. Gas chromatography / mass spectrometry (GC/MS) is the preferred confirm atory method. Performed at [facility], [address] [place], [place] [address]   Resp Panel by RT-PCR (Flu A&B, Covid) Nasopharyngeal Swab     Status: None   Collection Time: 06/03/21  4:42 PM   Specimen: Nasopharyngeal Swab; Nasopharyngeal(NP) swabs in vial transport medium  Result Value Ref Range   SARS Coronavirus 2 by RT PCR NEGATIVE NEGATIVE    Comment: (NOTE) SARS-CoV-2 target nucleic acids are NOT DETECTED.  The SARS-CoV-2 RNA is generally detectable in upper respiratory specimens during the acute phase of infection. The lowest concentration of SARS-CoV-2 viral copies this assay can  detect is 138 copies/mL. A negative result does not preclude SARS-Cov-2 infection and should not be used as the sole basis for treatment or other patient management decisions. A negative result may occur with  improper specimen collection/handling, submission of specimen other than nasopharyngeal swab, presence of viral mutation(s) within the areas targeted by this  assay, and inadequate number of viral copies(<138 copies/mL). A negative result must be combined with clinical observations, patient history, and epidemiological information. The expected result is Negative.  Fact Sheet for Patients:  BloggerCourse.com  Fact Sheet for Healthcare Providers:  SeriousBroker.it  This test is no t yet approved or cleared by the [place] FDA and  has been authorized for detection and/or diagnosis of SARS-CoV-2 by FDA under an Emergency Use Authorization (EUA). This EUA will remain  in effect (meaning this test can be used) for the duration of the COVID-19 declaration under Section 564(b)(1) of the Act, 21 U.S.C.section 360bbb-3(b)(1), unless the authorization is terminated  or revoked sooner.       Influenza A by PCR NEGATIVE NEGATIVE   Influenza B by PCR NEGATIVE NEGATIVE    Comment: (NOTE) The Xpert Xpress SARS-CoV-2/FLU/RSV plus assay is intended as an aid in the diagnosis of influenza from Nasopharyngeal swab specimens and should not be used as a sole basis for treatment. Nasal washings and aspirates are unacceptable for Xpert Xpress SARS-CoV-2/FLU/RSV testing.  Fact Sheet for Patients: BloggerCourse.com  Fact Sheet for Healthcare Providers: SeriousBroker.it  This test is not yet approved or cleared by the [place] FDA and has been authorized for detection and/or diagnosis of SARS-CoV-2 by FDA under an Emergency Use Authorization (EUA). This EUA will remain in effect (meaning this test can be used) for the duration of the COVID-19 declaration under Section 564(b)(1) of the Act, 21 U.S.C. section 360bbb-3(b)(1), unless the authorization is terminated or revoked.  Performed at [facility], [address] [place], [place] [address]     No current facility-administered medications for this encounter.   Current Outpatient  Medications  Medication Sig Dispense Refill   Loratadine (CLARITIN PO) Take by mouth. (Patient not taking: Reported on 05/31/2021)      Musculoskeletal: Strength & Muscle Tone: within normal limits Gait & Station: normal Patient leans: N/A  Psychiatric Specialty Exam:  Presentation  General Appearance: Appropriate for Environment  Eye Contact:Good  Speech:Clear and Coherent  Speech Volume:Normal  Handedness:Right   Mood and Affect  Mood:Depressed  Affect:Appropriate   Thought Process  Thought Processes:Coherent  Descriptions of Associations:Intact  Orientation:Full (Time, Place and Person)  Thought Content:Logical  History of Schizophrenia/Schizoaffective disorder:No  Duration of Psychotic Symptoms:No data recorded Hallucinations:Hallucinations: None  Ideas of Reference:None  Suicidal Thoughts:Suicidal Thoughts: Yes, Passive SI Passive Intent and/or Plan: With Intent; With Plan  Homicidal Thoughts:Homicidal Thoughts: No   Sensorium  Memory:Immediate Good; Recent Good; Remote Good  Judgment:Poor  Insight:Poor   Executive Functions  Concentration:Fair  Attention Span:Fair  Recall:Fair  Fund of Knowledge:Fair  Language:Fair   Psychomotor Activity  Psychomotor Activity:Psychomotor Activity: Normal   Assets  Assets:Desire for Improvement; Resilience; Social Support   Sleep  Sleep:Sleep: Good Number of Hours of Sleep: 8   Physical Exam: Physical Exam Vitals and nursing note reviewed.  Constitutional:      Appearance: Normal appearance.  HENT:     Head: Normocephalic and atraumatic.     Right Ear: External ear normal.     Left Ear: External ear normal.     Nose: Nose normal.  Mouth/Throat:     Mouth: Mucous membranes are moist.  Cardiovascular:     Rate and Rhythm: Normal rate.     Pulses: Normal pulses.  Pulmonary:     Effort: Pulmonary effort is normal.  Musculoskeletal:        General: Normal range of motion.      Cervical back: Normal range of motion and neck supple.  Neurological:     General: No focal deficit present.     Mental Status: He is alert and oriented to person, place, and time. Mental status is at baseline.  Psychiatric:        Attention and Perception: Attention and perception normal.        Mood and Affect: Mood is depressed. Affect is blunt.        Speech: Speech normal.        Behavior: Behavior is withdrawn. Behavior is cooperative.        Thought Content: Thought content includes suicidal ideation.        Cognition and Memory: Cognition and memory normal.        Judgment: Judgment normal.   Review of Systems  Psychiatric/Behavioral:  Positive for depression, substance abuse and suicidal ideas. The patient is nervous/anxious.   All other systems reviewed and are negative. Blood pressure 132/90, pulse 73, temperature 97.9 F (36.6 C), temperature source Oral, resp. rate 17, height 5\' 4"  (1.626 m), weight 54.4 kg, SpO2 98 %. Body mass index is 20.6 kg/m.  Treatment Plan Summary: Plan Patient does meet criteria for psychiatric inpatient admission  Disposition: Recommend psychiatric Inpatient admission when medically cleared. Supportive therapy provided about ongoing stressors.  , NP 06/03/2021 10:36 PM

## 2021-06-03 NOTE — ED Provider Notes (Signed)
? ?[facility] ?Provider Note ? ? ? None  ?  (approximate) ? ? ?History  ? ?Psychiatric Evaluation ? ? ?HPI ? ?Edward Shelton Edward Shelton is a 21 y.o. male with a history of substance abuse who presents with suicidal ideation and anxiety.  The patient is a somewhat disorganized historian but states that he was seen in the ED for similar complaints a few days ago.  He was referred to [facility] and went for the intake today, but states that he would not be able to be seen until the end of the week, and stated that he cannot wait this long.  He states he is not on any medications for mental health.  He states that he has been using shrooms but denies other active drug use or alcohol.  He denies any specific suicidal plan or attempts. ? ? ?Physical Exam  ? ?Triage Vital Signs: ?ED Triage Vitals [06/03/21 1634]  ?Enc Vitals Group  ?   BP 132/90  ?   Pulse Rate 73  ?   Resp 17  ?   Temp 97.9 ?F (36.6 ?C)  ?   Temp Source Oral  ?   SpO2 98 %  ?   Weight 120 lb (54.4 kg)  ?   Height 5\' 4"  (1.626 m)  ?   Head Circumference   ?   Peak Flow   ?   Pain Score 0  ?   Pain Loc   ?   Pain Edu?   ?   Excl. in [place]?   ? ? ?Most recent vital signs: ?Vitals:  ? 06/03/21 1634  ?BP: 132/90  ?Pulse: 73  ?Resp: 17  ?Temp: 97.9 ?F (36.6 ?C)  ?SpO2: 98%  ? ? ? ?General: Awake, slightly anxious appearing. ?CV:  Good peripheral perfusion.  ?Resp:  Normal effort.  ?Abd:  No distention.  ?Other:  Calm and cooperative, but with somewhat pressured speech, slightly tangential and disorganized. ? ? ?ED Results / Procedures / Treatments  ? ?Labs ?(all labs ordered are listed, but only abnormal results are displayed) ?Labs Reviewed  ?COMPREHENSIVE METABOLIC PANEL - Abnormal; Notable for the following components:  ?    Result Value  ? Total Protein 8.4 (*)   ? Albumin 5.1 (*)   ? Total Bilirubin 1.5 (*)   ? All other components within normal limits  ?SALICYLATE LEVEL - Abnormal; Notable for the following components:  ? Salicylate Lvl [id number] (*)    ? All other components within normal limits  ?ACETAMINOPHEN LEVEL - Abnormal; Notable for the following components:  ? Acetaminophen (Tylenol), Serum <10 (*)   ? All other components within normal limits  ?URINE DRUG SCREEN, QUALITATIVE ([facility] ONLY) - Abnormal; Notable for the following components:  ? Cannabinoid 50 Ng, Ur [place] POSITIVE (*)   ? All other components within normal limits  ?RESP PANEL BY RT-PCR (FLU A&B, COVID) ARPGX2  ?ETHANOL  ?CBC  ? ? ? ?EKG ? ? ? ?RADIOLOGY ? ? ? ?PROCEDURES: ? ?Critical Care performed: No ? ?Procedures ? ? ?MEDICATIONS ORDERED IN ED: ?Medications - No data to display ? ? ?IMPRESSION / MDM / ASSESSMENT AND PLAN / ED COURSE  ?I reviewed the triage vital signs and the nursing notes. ? ?21 year old male with a history of substance abuse presents with suicidal ideation intermittently intermittent suicidal ideation and anxiety after being seen in the ED for the same few days ago.  He was referred to [facility] but states he cannot wait  to get help and thinks he needs to be on medication. ? ?On exam his vital signs are normal and he is comfortable appearing but somewhat anxious.  He is alert and oriented but somewhat disorganized and pressured in his speech, and also somewhat vague about his symptoms.  However, he is able to contract for safety and presents voluntarily in order to get help.  He denies active SI at this time. ? ?Differential includes depression, adjustment disorder, substance induced mood disorder, or possible substance-induced psychosis. ? ?At this time the patient does not demonstrate an imminent danger to self or others so have not placed him under involuntary commitment.  I have ordered psychiatry and TTS consults and lab work-up for medical screening. ? ?----------------------------------------- ?10:41 PM on 06/03/2021 ?----------------------------------------- ? ?I consulted NP Grandville [name] from psychiatry who has evaluated the patient; based on her discussion she recommends  inpatient psychiatric admission ? ? ?FINAL CLINICAL IMPRESSION(S) / ED DIAGNOSES  ? ?Final diagnoses:  ?Suicidal ideation  ? ? ? ?Rx / DC Orders  ? ?ED Discharge Orders   ? ? None  ? ?  ? ? ? ?Note:  This document was prepared using Dragon voice recognition software and may include unintentional dictation errors.  ?  Arta Silence, MD ?06/03/21 2242 ? ?

## 2021-06-03 NOTE — ED Notes (Signed)
Staff offered snack tray, pt declined.  Pt requested/given a cup of soda & water.  Cont to monitor ?

## 2021-06-03 NOTE — BH Assessment (Signed)
Patient is to be admitted to [facility] by Psychiatric Nurse Practitioner Gillermo Murdoch.  ?Attending Physician will be Dr.  Toni Amend .   ?Patient has been assigned to room 324, by [facility] Charge Nurse King Salmon.   ?Intake Paper Work has been signed and placed on patient chart.  ?ER staff is aware of the admission: ?[facility] ER Secretary   ?Dr. Marisa Severin, ER MD  ?Selena Batten Patient's Nurse  ?[facility] Patient Access.  ?

## 2021-06-03 NOTE — ED Notes (Signed)
Black shoes, cell phone, black shirt, black sweatpants, grey hoodie, gold necklace, red bracelet, boxers ?

## 2021-06-04 DIAGNOSIS — F1910 Other psychoactive substance abuse, uncomplicated: Secondary | ICD-10-CM | POA: Diagnosis not present

## 2021-06-04 DIAGNOSIS — F1994 Other psychoactive substance use, unspecified with psychoactive substance-induced mood disorder: Secondary | ICD-10-CM

## 2021-06-04 NOTE — Plan of Care (Signed)
Patient new to the unit tonight, hasn't had time to progress  Problem: Education: Goal: Knowledge of [place] [facility] Education information/materials will improve Outcome: Not Progressing Goal: Emotional status will improve Outcome: Not Progressing Goal: Mental status will improve Outcome: Not Progressing Goal: Verbalization of understanding the information provided will improve Outcome: Not Progressing   Problem: Safety: Goal: Periods of time without injury will increase Outcome: Not Progressing   Problem: Education: Goal: Ability to make informed decisions regarding treatment will improve Outcome: Not Progressing   Problem: Medication: Goal: Compliance with prescribed medication regimen will improve Outcome: Not Progressing   

## 2021-06-04 NOTE — Progress Notes (Signed)
[facility] LCSW Progress Note ?  ?06/04/2021    4:35 PM ? ?Teja Arreola Labias  ? ?[id number]  ? ?Type of Contact and Topic:  Release of Information ? ?Mathews Robinsons, patient, provided written consent for CSW team to coordinate aftercare at this time. CSW to have further conversations with patient regarding care in the community.  ? ?Patient has appointment w/ [facility].   ? ?Signed:  ?Durenda Hurt, MSW, LCSWA, LCAS ?06/04/2021 4:35 PM ?

## 2021-06-04 NOTE — Progress Notes (Signed)
Patient calm and cooperative during assessment, denying SI/HI/AVH. Pt endorses anxiety and depression. Patient is hopeful he will D/C tomorrow. Pt given education, support, and encouragement to be active in his treatment plan. Pt didn't have any medications scheduled tonight and hasn't requested anything PRN. Pt being monitored Q 15 minutes for safety per unit protocol. Pt remains safe on the unit.  ?

## 2021-06-04 NOTE — Tx Team (Signed)
Initial Treatment Plan ?06/04/2021 ?12:33 AM ?Edward Shelton ?[id number] ? ? ? ?PATIENT STRESSORS: ?Medication change or noncompliance   ?Substance abuse   ? ? ?PATIENT STRENGTHS: ?Ability for insight  ?Motivation for treatment/growth  ? ? ?PATIENT IDENTIFIED PROBLEMS: ?Depression  ?Suicidal Ideation  ?Anxiety  ?  ?  ?  ?  ?  ?  ?  ? ?DISCHARGE CRITERIA:  ?Improved stabilization in mood, thinking, and/or behavior ?Verbal commitment to aftercare and medication compliance ? ?PRELIMINARY DISCHARGE PLAN: ?Outpatient therapy ?Return to previous living arrangement ? ?PATIENT/FAMILY INVOLVEMENT: ?This treatment plan has been presented to and reviewed with the patient, Edward Shelton. The patient has been given the opportunity to ask questions and make suggestions. ? ?Mallie Darting, RN ?06/04/2021, 12:33 AM ?

## 2021-06-04 NOTE — Progress Notes (Signed)
D: Pt awake and alert this shift. Present on the milieu and interacting appropriately with peers. Eating all meals and attending groups. Denies SI/HI/AVH. Rates anxiety and depression 7/10. Does not request prn for anxiety. Informs RN he will be okay. Encouraged to use coping skills. Eats all meals and attends groups.  ? ? ?A: Pt support emotionally and encouragement to verbalize needs. Pt given medication per protocol and standing orders. Q60m safety checks implemented and continued.  ? ?R: Pt remains safe on the unit. Will continue to monitor.  ?

## 2021-06-04 NOTE — H&P (Signed)
Psychiatric Admission Assessment Adult  Patient Identification: Edward Shelton MRN:  [id number] Date of Evaluation:  06/04/2021 Chief Complaint:  MDD (major depressive disorder), recurrent episode, moderate ([place]) [F33.1] Principal Diagnosis: Polysubstance abuse ([place]) Diagnosis:  Principal Problem:   Polysubstance abuse ([place]) Active Problems:   Substance induced mood disorder ([place])  History of Present Illness: Patient seen and chart reviewed.  This is a 21 year old who presented voluntarily to the emergency room with complaints that for several days he had had suicidal thoughts.  Patient states that this had been going on for about 4 days.  A couple times every day he found himself having some intrusive thoughts about killing himself.  He says that he had absolutely no intention of acting on it and feels like he has "a great life" and does not actually feel unhappy.  Patient says he has felt "depressed" for a couple of years.  When asked to describe what this is he says that he has crying spells at times when he tries to describe his emotions.  He does not describe constant sadness or change in mental function.  States that his energy level is good.  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  Patient's affect presents as smiling and upbeat.  He denies any hallucinations.  Most remarkable about his history is that he describes a long pattern of substance abuse primarily of psychedelics.  States that for a very long time he took LSD essentially every day.  He said after a while he switched to taking mushrooms almost every day.  Then he switched back to LSD.  He describes his "drug of choice" as being ketamine.  Says he has not used any ketamine in a couple of months because it was starting to cause bladder side effects for him.  He says he last used mushrooms 2 days ago.  Drug screen positive for cannabis but he minimizes that  saying he almost never actually uses cannabis products and rarely drinks.  Patient is renting an apartment from a distant relative living independently supporting himself.  Graduated from high school.  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  As mentioned he has an extensive history of abuse of psychedelic drugs.  No history of any treatment.  No history ever of any suicide attempts.  Never been prescribed any medication.  States he has never seen anyone for any mental health treatment in the past.  Is the patient at risk to self? No.  Has the patient been a risk to self in the past 6 months? No.  Has the patient been a risk to self within the distant past? No.  Is the patient a risk to others? No.  Has the patient been a risk to others in the past 6 months? No.  Has the patient been a risk to others within the distant past? No.   Prior Inpatient Therapy:   Prior Outpatient Therapy:    Alcohol  Screening: 1. How often do you have a drink containing alcohol?: Never 2. How many drinks containing alcohol do you have on a typical day when you are drinking?: 1 or 2 3. How often do you have six or more drinks on one occasion?: Never AUDIT-C Score: 0 4. How often during the last year have you found that you were not able to stop drinking once you had started?: Never 5. How often during the last year have you failed to do what was normally expected from you because of drinking?:  Never 6. How often during the last year have you needed a first drink in the morning to get yourself going after a heavy drinking session?: Never 7. How often during the last year have you had a feeling of guilt of remorse after drinking?: Never 8. How often during the last year have you been unable to remember what happened the night before because you had been drinking?: Never 9. Have you or someone else been injured as a result of your drinking?: No 10. Has a relative or friend or a doctor or another health worker been concerned about your drinking or suggested you cut down?: No Alcohol Use Disorder Identification Test Final Score (AUDIT): 0 Substance Abuse History in the last 12 months:  Yes.   Consequences of Substance Abuse: Unclear.  The patient presents his substance abuse problems as being inconsequential. Previous Psychotropic Medications: No  Psychological Evaluations: No  Past Medical History: History reviewed. No pertinent past medical history. History reviewed. No pertinent surgical history. Family History:  Family History  Problem Relation Age of Onset   Healthy Mother    Healthy Father    Family Psychiatric  History: Denies knowing of any Tobacco Screening:   Social History:  Social History   Substance and Sexual Activity  Alcohol Use Yes     Social History   Substance and Sexual Activity  Drug Use Yes   Types: Marijuana   Comment: shrooms, MDMA, ketamine    Additional Social History:                           Allergies:  No Known Allergies Lab Results:  Results for orders placed or performed during the hospital encounter of 06/03/21 (from the past 48 hour(s))  Comprehensive metabolic panel     Status: Abnormal   Collection Time: 06/03/21  4:42 PM  Result Value Ref Range   Sodium 136 135 - 145 mmol/L   Potassium 3.7 3.5 - 5.1 mmol/L   Chloride 104 98 - 111 mmol/L   CO2 25 22 - 32 mmol/L   Glucose, Bld 79 70 - 99 mg/dL    Comment: Glucose  reference range applies only to samples taken after fasting for at least 8 hours.   BUN 10 6 - 20 mg/dL   Creatinine, Ser 0.77 0.61 - 1.24 mg/dL   Calcium 9.6 8.9 - 10.3 mg/dL   Total Protein 8.4 (H) 6.5 - 8.1 g/dL   Albumin 5.1 (H) 3.5 - 5.0 g/dL   AST 20 15 - 41 U/L   ALT 18 0 - 44 U/L   Alkaline Phosphatase 103 38 - 126 U/L   Total Bilirubin 1.5 (H) 0.3 - 1.2 mg/dL   GFR, Estimated >60 >60 mL/min    Comment: (NOTE) Calculated using the CKD-EPI Creatinine Equation (2021)    Anion gap 7 5 - 15    Comment: Performed at [facility], [place]  [address], [place], [place] [address]  Ethanol     Status: None   Collection Time: 06/03/21  4:42 PM  Result Value Ref Range   Alcohol, Ethyl (B) <10 <10 mg/dL    Comment: (NOTE) Lowest detectable limit for serum alcohol is 10 mg/dL.  For medical purposes only. Performed at [facility], [address] [place], [place] [id number]   Salicylate level     Status: Abnormal   Collection Time: 06/03/21  4:42 PM  Result Value Ref Range   Salicylate Lvl [id number] (L) 7.0 - 30.0 mg/dL    Comment: Performed at [facility], [address] [place], [place] [address]  Acetaminophen level     Status: Abnormal   Collection Time: 06/03/21  4:42 PM  Result Value Ref Range   Acetaminophen (Tylenol), Serum <10 (L) 10 - 30 ug/mL    Comment: (NOTE) Therapeutic concentrations vary significantly. A range of 10-30 ug/mL  may be an effective concentration for many patients. However, some  are best treated at concentrations outside of this range. Acetaminophen concentrations >150 ug/mL at 4 hours after ingestion  and >50 ug/mL at 12 hours after ingestion are often associated with  toxic reactions.  Performed at [facility], [address] [place], [place] [address]   cbc     Status: None   Collection Time: 06/03/21  4:42 PM  Result Value Ref Range   WBC 8.6 4.0 - 10.5 K/uL   RBC 5.50 4.22 - 5.81 MIL/uL   Hemoglobin  16.7 13.0 - 17.0 g/dL   HCT 47.3 39.0 - 52.0 %   MCV 86.0 80.0 - 100.0 fL   MCH 30.4 26.0 - 34.0 pg   MCHC 35.3 30.0 - 36.0 g/dL   RDW 13.0 11.5 - 15.5 %   Platelets 212 150 - 400 K/uL   nRBC 0.0 0.0 - 0.2 %    Comment: Performed at [facility], [address], [place], [place] [address]  Urine Drug Screen, Qualitative     Status: Abnormal   Collection Time: 06/03/21  4:42 PM  Result Value Ref Range   Tricyclic, Ur Screen NONE DETECTED NONE DETECTED   Amphetamines, Ur Screen NONE DETECTED NONE DETECTED   MDMA (Ecstasy)Ur Screen NONE DETECTED NONE DETECTED   Cocaine Metabolite,Ur [place] NONE DETECTED NONE DETECTED   Opiate, Ur Screen NONE DETECTED NONE DETECTED   Phencyclidine (PCP) Ur S NONE DETECTED NONE DETECTED   Cannabinoid 50 Ng, Ur [place] POSITIVE (A) NONE DETECTED   Barbiturates, Ur Screen NONE DETECTED NONE DETECTED   Benzodiazepine, Ur Scrn NONE DETECTED NONE DETECTED   Methadone Scn, Ur NONE DETECTED NONE DETECTED    Comment: (NOTE) Tricyclics + metabolites, urine    Cutoff 1000 ng/mL Amphetamines + metabolites, urine  Cutoff 1000 ng/mL MDMA (Ecstasy), urine              Cutoff 500 ng/mL Cocaine Metabolite, urine          Cutoff 300 ng/mL Opiate + metabolites, urine        Cutoff 300 ng/mL Phencyclidine (PCP), urine         Cutoff 25 ng/mL Cannabinoid, urine                 Cutoff 50 ng/mL Barbiturates + metabolites, urine  Cutoff 200 ng/mL Benzodiazepine, urine              Cutoff 200 ng/mL Methadone, urine  Cutoff 300 ng/mL  The urine drug screen provides only a preliminary, unconfirmed analytical test result and should not be used for non-medical purposes. Clinical consideration and professional judgment should be applied to any positive drug screen result due to possible interfering substances. A more specific alternate chemical method must be used in order to obtain a confirmed analytical result. Gas chromatography / mass spectrometry (GC/MS)  is the preferred confirm atory method. Performed at [facility], [address] [place], [place] [address]   Resp Panel by RT-PCR (Flu A&B, Covid) Nasopharyngeal Swab     Status: None   Collection Time: 06/03/21  4:42 PM   Specimen: Nasopharyngeal Swab; Nasopharyngeal(NP) swabs in vial transport medium  Result Value Ref Range   SARS Coronavirus 2 by RT PCR NEGATIVE NEGATIVE    Comment: (NOTE) SARS-CoV-2 target nucleic acids are NOT DETECTED.  The SARS-CoV-2 RNA is generally detectable in upper respiratory specimens during the acute phase of infection. The lowest concentration of SARS-CoV-2 viral copies this assay can detect is 138 copies/mL. A negative result does not preclude SARS-Cov-2 infection and should not be used as the sole basis for treatment or other patient management decisions. A negative result may occur with  improper specimen collection/handling, submission of specimen other than nasopharyngeal swab, presence of viral mutation(s) within the areas targeted by this assay, and inadequate number of viral copies(<138 copies/mL). A negative result must be combined with clinical observations, patient history, and epidemiological information. The expected result is Negative.  Fact Sheet for Patients:  EntrepreneurPulse.com.au  Fact Sheet for Healthcare Providers:  IncredibleEmployment.be  This test is no t yet approved or cleared by the [place] FDA and  has been authorized for detection and/or diagnosis of SARS-CoV-2 by FDA under an Emergency Use Authorization (EUA). This EUA will remain  in effect (meaning this test can be used) for the duration of the COVID-19 declaration under Section 564(b)(1) of the Act, 21 U.S.C.section 360bbb-3(b)(1), unless the authorization is terminated  or revoked sooner.       Influenza A by PCR NEGATIVE NEGATIVE   Influenza B by PCR NEGATIVE NEGATIVE    Comment: (NOTE) The Xpert  Xpress SARS-CoV-2/FLU/RSV plus assay is intended as an aid in the diagnosis of influenza from Nasopharyngeal swab specimens and should not be used as a sole basis for treatment. Nasal washings and aspirates are unacceptable for Xpert Xpress SARS-CoV-2/FLU/RSV testing.  Fact Sheet for Patients: EntrepreneurPulse.com.au  Fact Sheet for Healthcare Providers: IncredibleEmployment.be  This test is not yet approved or cleared by the [place] FDA and has been authorized for detection and/or diagnosis of SARS-CoV-2 by FDA under an Emergency Use Authorization (EUA). This EUA will remain in effect (meaning this test can be used) for the duration of the COVID-19 declaration under Section 564(b)(1) of the Act, 21 U.S.C. section 360bbb-3(b)(1), unless the authorization is terminated or revoked.  Performed at [facility], [address] [place], [place] [address]     Blood Alcohol level:  Lab Results  Component Value Date   [facility] <10 06/03/2021   ETH <10 [id number]    Metabolic Disorder Labs:  No results found for: HGBA1C, MPG No results found for: PROLACTIN No results found for: CHOL, TRIG, HDL, CHOLHDL, VLDL, LDLCALC  Current Medications: Current Facility-Administered Medications  Medication Dose Route Frequency Provider Last Rate Last Admin   acetaminophen (TYLENOL) tablet 650 mg  650 mg Oral Q6H PRN Caroline Sauger, NP       alum & mag hydroxide-simeth (  MAALOX/MYLANTA) 200-200-20 MG/5ML suspension 30 mL  30 mL Oral Q4H PRN Caroline Sauger, NP       magnesium hydroxide (MILK OF MAGNESIA) suspension 30 mL  30 mL Oral Daily PRN Caroline Sauger, NP       traZODone (DESYREL) tablet 50 mg  50 mg Oral QHS PRN Caroline Sauger, NP       PTA Medications: Medications Prior to Admission  Medication Sig Dispense Refill Last Dose   Loratadine (CLARITIN PO) Take by mouth. (Patient not taking: Reported on 05/31/2021)        Musculoskeletal: Strength & Muscle Tone: within normal limits Gait & Station: normal Patient leans: N/A            Psychiatric Specialty Exam:  Presentation  General Appearance: Appropriate for Environment  Eye Contact:Good  Speech:Clear and Coherent  Speech Volume:Normal  Handedness:Right   Mood and Affect  Mood:Depressed  Affect:Appropriate   Thought Process  Thought Processes:Coherent  Duration of Psychotic Symptoms: No data recorded Past Diagnosis of Schizophrenia or Psychoactive disorder: No  Descriptions of Associations:Intact  Orientation:Full (Time, Place and Person)  Thought Content:Logical  Hallucinations:Hallucinations: None  Ideas of Reference:None  Suicidal Thoughts:Suicidal Thoughts: Yes, Passive SI Passive Intent and/or Plan: With Intent; With Plan  Homicidal Thoughts:Homicidal Thoughts: No   Sensorium  Memory:Immediate Good; Recent Good; Remote Good  Judgment:Poor  Insight:Poor   Executive Functions  Concentration:Fair  Attention Span:Fair  [place]   Psychomotor Activity  Psychomotor Activity:Psychomotor Activity: Normal   Assets  Assets:Desire for Improvement; Resilience; Social Support   Sleep  Sleep:Sleep: Good Number of Hours of Sleep: 8    Physical Exam: Physical Exam Vitals and nursing note reviewed.  Constitutional:      Appearance: Normal appearance.  HENT:     Head: Normocephalic and atraumatic.     Mouth/Throat:     Pharynx: Oropharynx is clear.  Eyes:     Pupils: Pupils are equal, round, and reactive to light.  Cardiovascular:     Rate and Rhythm: Normal rate and regular rhythm.  Pulmonary:     Effort: Pulmonary effort is normal.     Breath sounds: Normal breath sounds.  Abdominal:     General: Abdomen is flat.     Palpations: Abdomen is soft.  Musculoskeletal:        General: Normal range of motion.  Skin:    General: Skin is  warm and dry.  Neurological:     General: No focal deficit present.     Mental Status: He is alert. Mental status is at baseline.  Psychiatric:        Attention and Perception: Attention normal.        Mood and Affect: Mood normal.        Speech: Speech normal.        Behavior: Behavior normal.        Thought Content: Thought content normal.        Cognition and Memory: Cognition normal.   Review of Systems  Constitutional: Negative.   HENT: Negative.    Eyes: Negative.   Respiratory: Negative.    Cardiovascular: Negative.   Gastrointestinal: Negative.   Musculoskeletal: Negative.   Skin: Negative.   Neurological: Negative.   Psychiatric/Behavioral: Negative.    Blood pressure 132/81, pulse (!) 55, temperature (!) 97.4 F (36.3 C), temperature source Oral, resp. rate 17, height 5\' 4"  (1.626 m), weight 54.4 kg, SpO2 100 %. Body mass index is 20.6 kg/m.  Treatment  Plan Summary: Plan neatly dressed and groomed young man.  Multiple positive things in life he enjoys.  No anhedonia no consistently depressed mood.  The suicidal thoughts he describes were intrusive thoughts but without any intention of acting on them and he is very clear that he loves his life and does not want to act on them.  The most remarkable thing about his history is his heavy drug abuse.  At this time from the evaluation with him I cannot come up with a clear diagnosis.  He does not meet criteria for major depression or for bipolar disorder.  Patient has been encouraged strongly to discontinue drug use.  Also to follow up with [facility] where he had planned to be seen earlier in the week.  No clear indication I see for a specific medication.  Patient will have treatment team evaluation and assessment and then likely discharge to outpatient treatment soon.  Observation Level/Precautions:  15 minute checks  Laboratory:  UDS  Psychotherapy:    Medications:    Consultations:    Discharge Concerns:    Estimated LOS:  Other:      Physician Treatment Plan for Primary Diagnosis: Polysubstance abuse ([place]) Long Term Goal(s): Improvement in symptoms so as ready for discharge  Short Term Goals: Ability to disclose and discuss suicidal ideas and Ability to demonstrate self-control will improve  Physician Treatment Plan for Secondary Diagnosis: Principal Problem:   Polysubstance abuse ([place]) Active Problems:   Substance induced mood disorder ([place])  Long Term Goal(s): Improvement in symptoms so as ready for discharge  Short Term Goals: Ability to identify triggers associated with substance abuse/mental health issues will improve  I certify that inpatient services furnished can reasonably be expected to improve the patient's condition.    Alethia Berthold, MD 3/2/202312:53 PM

## 2021-06-04 NOTE — [facility] Suicide Risk Assessment (Signed)
[facility] Admission Suicide Risk Assessment ? ? ?Nursing information obtained from:  Patient ?Demographic factors:  Male ?Current Mental Status:  Self-harm thoughts ?Loss Factors:  NA ?Historical Factors:  NA ?Risk Reduction Factors:  NA ? ?Total Time spent with patient: 1 hour ?Principal Problem: Polysubstance abuse ([facility]) ?Diagnosis:  Principal Problem: ?  Polysubstance abuse ([facility]) ?Active Problems: ?  Substance induced mood disorder ([facility]) ? ?Subjective Data: 21 year old who presented to the emergency room complaining of mood symptoms and recent suicidal thoughts.  Currently denies feeling sad and denies any suicidal thoughts and denies any psychotic symptoms.  Extensive history of complex drug abuse. ? ?Continued Clinical Symptoms:  ?Alcohol Use Disorder Identification Test Final Score (AUDIT): 0 ?The "Alcohol Use Disorders Identification Test", Guidelines for Use in Primary Care, Second Edition.  World Science writer [facility]). ?Score between 0-7:  no or low risk or alcohol related problems. ?Score between 8-15:  moderate risk of alcohol related problems. ?Score between 16-19:  high risk of alcohol related problems. ?Score 20 or above:  warrants further diagnostic evaluation for alcohol dependence and treatment. ? ? ?CLINICAL FACTORS:  ? Alcohol/Substance Abuse/Dependencies ? ? ?Musculoskeletal: ?Strength & Muscle Tone: within normal limits ?Gait & Station: normal ?Patient leans: N/A ? ?Psychiatric Specialty Exam: ? ?Presentation  ?General Appearance: Appropriate for Environment ? ?Eye Contact:Good ? ?Speech:Clear and Coherent ? ?Speech Volume:Normal ? ?Handedness:Right ? ? ?Mood and Affect  ?Mood:Depressed ? ?Affect:Appropriate ? ? ?Thought Process  ?Thought Processes:Coherent ? ?Descriptions of Associations:Intact ? ?Orientation:Full (Time, Place and Person) ? ?Thought Content:Logical ? ?History of Schizophrenia/Schizoaffective disorder:No ? ?Duration of Psychotic Symptoms:No data  recorded ?Hallucinations:Hallucinations: None ? ?Ideas of Reference:None ? ?Suicidal Thoughts:Suicidal Thoughts: Yes, Passive ?SI Passive Intent and/or Plan: With Intent; With Plan ? ?Homicidal Thoughts:Homicidal Thoughts: No ? ? ?Sensorium  ?Memory:Immediate Good; Recent Good; Remote Good ? ?Judgment:Poor ? ?Insight:Poor ? ? ?Executive Functions  ?Concentration:Fair ? ?Attention Span:Fair ? ?Recall:Fair ? ?Fund of Knowledge:Fair ? ?Language:Fair ? ? ?Psychomotor Activity  ?Psychomotor Activity:Psychomotor Activity: Normal ? ? ?Assets  ?Assets:Desire for Improvement; Resilience; Social Support ? ? ?Sleep  ?Sleep:Sleep: Good ?Number of Hours of Sleep: 8 ? ? ? ?Physical Exam: ?Physical Exam ?Vitals and nursing note reviewed.  ?Constitutional:   ?   Appearance: Normal appearance.  ?HENT:  ?   Head: Normocephalic and atraumatic.  ?   Mouth/Throat:  ?   Pharynx: Oropharynx is clear.  ?Eyes:  ?   Pupils: Pupils are equal, round, and reactive to light.  ?Cardiovascular:  ?   Rate and Rhythm: Normal rate and regular rhythm.  ?Pulmonary:  ?   Effort: Pulmonary effort is normal.  ?   Breath sounds: Normal breath sounds.  ?Abdominal:  ?   General: Abdomen is flat.  ?   Palpations: Abdomen is soft.  ?Musculoskeletal:     ?   General: Normal range of motion.  ?Skin: ?   General: Skin is warm and dry.  ?Neurological:  ?   General: No focal deficit present.  ?   Mental Status: He is alert. Mental status is at baseline.  ?Psychiatric:     ?   Attention and Perception: Attention normal.     ?   Mood and Affect: Mood normal.     ?   Speech: Speech normal.     ?   Behavior: Behavior is cooperative.     ?   Thought Content: Thought content normal.     ?   Cognition and Memory: Cognition normal.     ?  Judgment: Judgment is impulsive.  ? ?Review of Systems  ?Constitutional: Negative.   ?HENT: Negative.    ?Eyes: Negative.   ?Respiratory: Negative.    ?Cardiovascular: Negative.   ?Gastrointestinal: Negative.   ?Musculoskeletal:  Negative.   ?Skin: Negative.   ?Neurological: Negative.   ?Psychiatric/Behavioral: Negative.    ?Blood pressure 132/81, pulse (!) 55, temperature (!) 97.4 ?F (36.3 ?C), temperature source Oral, resp. rate 17, height 5\' 4"  (1.626 m), weight 54.4 kg, SpO2 100 %. Body mass index is 20.6 kg/m?. ? ? ?COGNITIVE FEATURES THAT CONTRIBUTE TO RISK:  ?None   ? ?SUICIDE RISK:  ? Minimal: No identifiable suicidal ideation.  Patients presenting with no risk factors but with morbid ruminations; may be classified as minimal risk based on the severity of the depressive symptoms ? ?PLAN OF CARE: Patient is very clear in saying multiple times that he would never try to harm himself and he has never done anything to harm himself in the past.  Currently not having any suicidal ideation.  His description of his mood is somewhat hard to follow.  Continue 15-minute checks for now.  Engage in individual and group assessment.  Reassess dangerousness prior to discharge ? ?I certify that inpatient services furnished can reasonably be expected to improve the patient's condition.  ? ? , MD ?06/04/2021, 12:16 PM ? ?

## 2021-06-04 NOTE — Plan of Care (Signed)
  Problem: Education: Goal: Knowledge of [place] [facility] Education information/materials will improve Outcome: Progressing Goal: Emotional status will improve Outcome: Progressing Goal: Mental status will improve Outcome: Progressing Goal: Verbalization of understanding the information provided will improve Outcome: Progressing   Problem: Safety: Goal: Periods of time without injury will increase Outcome: Progressing   Problem: Education: Goal: Ability to make informed decisions regarding treatment will improve Outcome: Progressing   Problem: Medication: Goal: Compliance with prescribed medication regimen will improve Outcome: Progressing   

## 2021-06-04 NOTE — Group Note (Signed)
[facility] LCSW Group Therapy Note ? ? ?Group Date: 06/04/2021 ?Start Time: 1300 ?End Time: 1400 ? ? ?Type of Therapy/Topic:  Group Therapy:  Balance in Life ? ?Participation Level:  Active  ? ?Description of Group:   ? This group will address the concept of balance and how it feels and looks when one is unbalanced. Patients will be encouraged to process areas in their lives that are out of balance, and identify reasons for remaining unbalanced. Facilitators will guide patients utilizing problem- solving interventions to address and correct the stressor making their life unbalanced. Understanding and applying boundaries will be explored and addressed for obtaining  and maintaining a balanced life. Patients will be encouraged to explore ways to assertively make their unbalanced needs known to significant others in their lives, using other group members and facilitator for support and feedback. ? ?Therapeutic Goals: ?Patient will identify two or more emotions or situations they have that consume much of in their lives. ?Patient will identify signs/triggers that life has become out of balance:  ?Patient will identify two ways to set boundaries in order to achieve balance in their lives:  ?Patient will demonstrate ability to communicate their needs through discussion and/or role plays ? ?Summary of Patient Progress: ?Patient was present for the entirety of the group session. Patient was an active listener and participated in the topic of discussion, provided helpful advice to others, and added nuance to topic of conversation. Patient shared that he thinks that sticking to a schedule will help him find balance in life.  ? ? ? ?Therapeutic Modalities:   ?Cognitive Behavioral Therapy ?Solution-Focused Therapy ?Assertiveness Training ? ? ?Corky Crafts, LCSWA ?

## 2021-06-04 NOTE — Progress Notes (Signed)
Recreation Therapy Notes ? ? ?  ?Date: 06/04/2021 ?  ?Time: 10:25 am  ?  ?Location: Craft room  ?  ?Behavioral response: Appropriate ?  ?Intervention Topic:  Relaxation   ?  ?Discussion/Intervention:  ?Group content today was focused on relaxation. The group defined relaxation and identified healthy ways to relax. Individuals expressed how much time they spend relaxing. Patients expressed how much their life would be if they did not make time for themselves to relax. The group stated ways they could improve their relaxation techniques in the future.  Individuals participated in the intervention ?Time to Relax? where they had a chance to experience different relaxation techniques.  ?  ?Clinical Observations/Feedback: ?Patient came to group and stated that he watches television to relax. He expressed that relaxation is important because stress builds up. Individual was social with peers and staff while participating in the intervention.   ?Edward Shelton LRT/CTRS  ? ? ? ? ? ? ? ?Edward Shelton ?06/04/2021 1:01 PM ?

## 2021-06-04 NOTE — [facility] Counselor (Signed)
Adult Comprehensive Assessment ? ?Patient ID: Edward Shelton, male   DOB: Jan 20, 2001, 21 y.o.   MRN: [id number] ? ?Information Source: ?Information source: Patient ? ?Current Stressors:  ?Patient states their primary concerns and needs for treatment are:: States he has been having SI for the past 4 days, is no longer self medicating his depression w/ Ketamine. ?Patient states their goals for this hospitilization and ongoing recovery are:: Patient would like to get medication managment and start on conventional antidepressant. ?Educational / Learning stressors: none repported ?Employment / Job issues: none reported ?Family Relationships: none reported ?Financial / Lack of resources (include bankruptcy): none reported ?Housing / Lack of housing: none reported ?Physical health (include injuries & life threatening diseases): Endorses bladder issues related to longterm Ketamine use and pain in his lower back. ?Social relationships: none reported ?Substance abuse: See SUD section for more details. ?Bereavement / Loss: none reported ? ?Living/Environment/Situation:  ?Living Arrangements: Other relatives ?Living conditions (as described by patient or guardian): States living conditions are WNL. ?Who else lives in the home?: Lives with "cousin's brother in law" ?How long has patient lived in current situation?: Since June 2021 ?What is atmosphere in current home: Comfortable ? ?Family History:  ?Marital status: Single ?Are you sexually active?: Yes ?What is your sexual orientation?: Heterosexual ?Does patient have children?: No ? ?Childhood History:  ?By whom was/is the patient raised?: Mother/father and step-parent ?Additional childhood history information: No contact w/ bioogical father. ?Description of patient's relationship with caregiver when they were a child: States he was a "trouble maker" which strained the relationship with his mother, further states that his sister was a Surveyor, minerals and got into  [place]" ?Patient's description of current relationship with people who raised him/her: States his relationship is now "good" as he has matured from his youth. ?Does patient have siblings?: Yes ?Number of Siblings: 3 (sisters) ?Did patient suffer any verbal/emotional/physical/sexual abuse as a child?: No ?Did patient suffer from severe childhood neglect?: No ?Has patient ever been sexually abused/assaulted/raped as an adolescent or adult?: No ?Was the patient ever a victim of a crime or a disaster?: No ?Witnessed domestic violence?: No ?Has patient been affected by domestic violence as an adult?: No ? ?Education:  ?Highest grade of school patient has completed: HS Diploma ?Currently a student?: No ?Learning disability?: No ? 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 ?Financial Resources:   ?Financial resources: Income from employment, Medicaid ?Does patient have a representative payee or guardian?: No ? ?Alcohol/Substance Abuse:   ?What has been your use of drugs/alcohol within the last 12 months?: States that he has been sober from ketamine since [place], used shrooms a couple of weeks ago, occasional alcohol use. ?If attempted suicide, did drugs/alcohol play a role in this?:  (n/a) ?Alcohol/Substance Abuse Treatment Hx: Denies past history ?Has alcohol/substance abuse ever caused legal problems?: No ? ?Social Support System:   ?Patient's Community Support System: Good ?Describe Community Support System: Patient states his family <mother and sisters> are supportive of her mental health. ?Type of faith/religion: "I read the Quran" ?How does patient's faith help to cope with current illness?: Patient requests to see the chaplin. ? ?Leisure/Recreation:   ?Do You Have Hobbies?: Yes ?Leisure  and Hobbies: soccer ? ?Strengths/Needs:   ?Patient states these barriers may affect/interfere with their treatment:  none reported ?Patient states these barriers may affect their return to the community: none reported ?Other important information patient would like considered in planning for their treatment: none reported ? ?Discharge Plan:   ?Currently receiving community mental health services: No (patient states he is interested in [facility] outpatient mental health) ?Does patient have access to transportation?: Yes ?Does patient have financial barriers related to discharge medications?: No (Medicaid) ?Will patient be returning to same living situation after discharge?: Yes ? ?Summary/Recommendations:   ?Summary and Recommendations (to be completed by the evaluator): 21 y/o single male w/ dx of MDD, recurrent severe from [place] w/ Medicaid; admitted due to ongoing suicidal ideation. During assessment, patient states he was self-medicating with ketamine and has not used any since December due to issues emptying bladder. Patient is interested in starting conventional antidepressants. Endorses stress related to physical health (bladder issues and "kidney pain"); denies all other stress. Patient states that he is unsure why he feels depressed stating that he "has everything." Denies childhood and adult trauma. 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 No known past psychiatric hospitalizations. States that he has been sober from ketamine since December, used shrooms a couple of weeks ago, occasional alcohol use. Patient presents as euthymic congruent w/ context, calm and cooperative; currently denies SI/HI/AVH; no evidence of memory or concentration impairment; speech WNL; appearance is WNL; insight is poor; judgment is fair. Therapeutic recommendations include crisis stabilization, medication management, group therapy, and case management. ? ?Durenda Hurt. 06/04/2021 ?

## 2021-06-04 NOTE — [facility] Suicide Risk Assessment (Signed)
[facility] INPATIENT:  Family/Significant Other Suicide Prevention Education ? ?Suicide Prevention Education:  ?Patient Refusal for Family/Significant Other Suicide Prevention Education: The patient Edward Shelton has refused to provide written consent for family/significant other to be provided Family/Significant Other Suicide Prevention Education during admission and/or prior to discharge.  Physician notified. ? ?CSW completed SPE with patient. Discussed potential triggers leading to suicidal ideation in addition to coping skills one might use in order to delay and distract self from self harming behaviors. CSW encouraged patient to utilize emergency services if they felt unable to maintain their safety. SPE flyer provided to patient at this time.  ? ?Corky Crafts ?06/04/2021, 4:35 PM ?

## 2021-06-04 NOTE — Progress Notes (Signed)
Patient admitted from [facility] ED, report received from Maudie Mercury, [place]. Pt calm and cooperative during assessment. Pt denies SI/HI/AVH. Pt stated he just had a thought about killing himself, but he wouldn't actually do it. Pt presents flat. Pt endorses depression and anxiety. Pt admits to drug use. Pt given education, support and encouragement. Pt oriented to the unit and his room, provided snack. Pt being monitored Q 15 minutes for safety per unit protocol. Pt remains safe on the unit.  ?

## 2021-06-05 DIAGNOSIS — F1910 Other psychoactive substance abuse, uncomplicated: Secondary | ICD-10-CM | POA: Diagnosis not present

## 2021-06-05 NOTE — Progress Notes (Signed)
D: Pt alert and awake this shift. Calm and cooperative. Denies SI/HI/AVH. Rates depression 3/10 and anxiety 0/10.  ? ?A: Labs and vital signs monitored. Pt provided support and encouragement and encouraged to verbalize needs.Patient currently not on medication scheduled medication. PRN medication available per request. Q43m safety checks implemented and continued.  ? ?R: Pt safe on the unit. Will continue to monitor.  ?

## 2021-06-05 NOTE — Progress Notes (Signed)
?  [facility] Adult Case Management Discharge Plan : ? ?Will you be returning to the same living situation after discharge:  Yes,  Patient to return to place of residence w/ mother and sisters.  ?At discharge, do you have transportation home?: Yes,  Patient's family to assist with transportation.  ?Do you have the ability to pay for your medications: Yes,  Medicaid ? ?Release of information consent forms completed and in the chart;  Patient's signature needed at discharge. ? ?Patient to Follow up at: ? Follow-up Information   ? ? [employer] Go on 06/08/2021.   ?Why: Please meet Lorella Nimrod, peer support bridge, at [facility] on Monday at 0730. ?Contact information: ?[address] ?[place] [address] ?[phone] ? ? ?  ?  ? ?  ?  ? ?  ? ? ?Next level of care provider has access to [facility] Link:no ? ?Safety Planning and Suicide Prevention discussed: Yes,  SPE completed with patient, declined consent for CSW to reach collateral.  ?  ?Has patient been referred to the Quitline?: Yes, faxed on 06/05/2021 ? ?Patient has been referred for addiction treatment: Pt. refused referral Patient declined SUD treatment, endorses ketamine, psychedelic, and THC use.  ? ?Glenis Smoker, LCSW ?06/05/2021, 10:41 AM ?

## 2021-06-05 NOTE — Progress Notes (Signed)
D: Pt alert and oriented. Pt denies experiencing any pain, SI/HI, or AVH at this time. Pt reports he will be able to keep himself safe when they return home. Pt has completed a suicide safety plan and was given a survey to fill out. ? ?A: Pt received discharge education/information. Pt belongings were returned and signed for at this time.  ? ?R: Pt  verbalized understanding of discharge and medication education/information. ? ?Pt escorted to front lobby and picked up by family.    ?

## 2021-06-05 NOTE — BH IP Treatment Plan (Signed)
Interdisciplinary Treatment and Diagnostic Plan Update ? ?06/05/2021 ?Time of Session: 9:30 AM ?Edward Shelton ?MRN: [id number] ? ?Principal Diagnosis: Polysubstance abuse (HCC) ? ?Secondary Diagnoses: Principal Problem: ?  Polysubstance abuse (HCC) ?Active Problems: ?  Substance induced mood disorder (HCC) ? ? ?Current Medications:  ?Current Facility-Administered Medications  ?Medication Dose Route Frequency Provider Last Rate Last Admin  ? acetaminophen (TYLENOL) tablet 650 mg  650 mg Oral Q6H PRN Gillermo Murdoch, NP      ? alum & mag hydroxide-simeth (MAALOX/MYLANTA) 200-200-20 MG/5ML suspension 30 mL  30 mL Oral Q4H PRN Gillermo Murdoch, NP      ? magnesium hydroxide (MILK OF MAGNESIA) suspension 30 mL  30 mL Oral Daily PRN Gillermo Murdoch, NP      ? traZODone (DESYREL) tablet 50 mg  50 mg Oral QHS PRN Gillermo Murdoch, NP      ? ?PTA Medications: ?Medications Prior to Admission  ?Medication Sig Dispense Refill Last Dose  ? Loratadine (CLARITIN PO) Take by mouth. (Patient not taking: Reported on 05/31/2021)     ? ? ?Patient Stressors: Medication change or noncompliance   ?Substance abuse   ? ?Patient Strengths: Ability for insight  ?Motivation for treatment/growth  ? ?Treatment Modalities: Medication Management, Group therapy, Case management,  ?1 to 1 session with clinician, Psychoeducation, Recreational therapy. ? ? ?Physician Treatment Plan for Primary Diagnosis: Polysubstance abuse (HCC) ?Long Term Goal(s): Improvement in symptoms so as ready for discharge  ? ?Short Term Goals: Ability to identify triggers associated with substance abuse/mental health issues will improve ?Ability to disclose and discuss suicidal ideas ?Ability to demonstrate self-control will improve ? ?Medication Management: Evaluate patient's response, side effects, and tolerance of medication regimen. ? ?Therapeutic Interventions: 1 to 1 sessions, Unit Group sessions and Medication administration. ? ?Evaluation of  Outcomes: Adequate for Discharge ? ?Physician Treatment Plan for Secondary Diagnosis: Principal Problem: ?  Polysubstance abuse (HCC) ?Active Problems: ?  Substance induced mood disorder (HCC) ? ?Long Term Goal(s): Improvement in symptoms so as ready for discharge  ? ?Short Term Goals: Ability to identify triggers associated with substance abuse/mental health issues will improve ?Ability to disclose and discuss suicidal ideas ?Ability to demonstrate self-control will improve    ? ?Medication Management: Evaluate patient's response, side effects, and tolerance of medication regimen. ? ?Therapeutic Interventions: 1 to 1 sessions, Unit Group sessions and Medication administration. ? ?Evaluation of Outcomes: Adequate for Discharge ? ? ?RN Treatment Plan for Primary Diagnosis: Polysubstance abuse (HCC) ?Long Term Goal(s): Knowledge of disease and therapeutic regimen to maintain health will improve ? ?Short Term Goals: Ability to remain free from injury will improve, Ability to verbalize frustration and anger appropriately will improve, Ability to demonstrate self-control, Ability to participate in decision making will improve, Ability to verbalize feelings will improve, Ability to disclose and discuss suicidal ideas, Ability to identify and develop effective coping behaviors will improve, and Compliance with prescribed medications will improve ? ?Medication Management: RN will administer medications as ordered by provider, will assess and evaluate patient's response and provide education to patient for prescribed medication. RN will report any adverse and/or side effects to prescribing provider. ? ?Therapeutic Interventions: 1 on 1 counseling sessions, Psychoeducation, Medication administration, Evaluate responses to treatment, Monitor vital signs and CBGs as ordered, Perform/monitor CIWA, COWS, AIMS and Fall Risk screenings as ordered, Perform wound care treatments as ordered. ? ?Evaluation of Outcomes: Adequate for  Discharge ? ? ?LCSW Treatment Plan for Primary Diagnosis: Polysubstance abuse (HCC) ?Long Term Goal(s): Safe transition to  appropriate next level of care at discharge, Engage patient in therapeutic group addressing interpersonal concerns. ? ?Short Term Goals: Engage patient in aftercare planning with referrals and resources, Increase social support, Increase ability to appropriately verbalize feelings, Increase emotional regulation, Facilitate acceptance of mental health diagnosis and concerns, and Increase skills for wellness and recovery ? ?Therapeutic Interventions: Assess for all discharge needs, 1 to 1 time with Child psychotherapist, Explore available resources and support systems, Assess for adequacy in community support network, Educate family and significant other(s) on suicide prevention, Complete Psychosocial Assessment, Interpersonal group therapy. ? ?Evaluation of Outcomes: Adequate for Discharge ? ? ?Progress in Treatment: ?Attending groups: Yes. ?Participating in groups: Yes. ?Taking medication as prescribed: No. ?Toleration medication: No. ?Family/Significant other contact made: No, will contact:  when given permission. ?Patient understands diagnosis: Yes. ?Discussing patient identified problems/goals with staff: Yes. ?Medical problems stabilized or resolved: Yes. ?Denies suicidal/homicidal ideation: Yes. ?Issues/concerns per patient self-inventory: No. ?Other: none. ? ?New problem(s) identified: No, Describe:  none. ? ?New Short Term/Long Term Goal(s):  medication management for mood stabilization; elimination of SI thoughts; development of comprehensive mental wellness plan. ? ?Patient Goals: "To get on the meds I need."  ? ?Discharge Plan or Barriers: Pt to discharge home with outpatient treatment through [facility]. ? ?Reason for Continuation of Hospitalization: Depression ?Suicidal ideation ? ?Estimated Length of Stay: 1-7 days ? ? ?Scribe for Treatment Team: ?Glenis Smoker, LCSW ?06/05/2021 ?10:27 AM ?

## 2021-06-05 NOTE — Plan of Care (Signed)
?  Problem: Education: ?Goal: Knowledge of [facility] Education information/materials will improve ?Outcome: Adequate for Discharge ?Goal: Emotional status will improve ?Outcome: Adequate for Discharge ?Goal: Mental status will improve ?Outcome: Adequate for Discharge ?Goal: Verbalization of understanding the information provided will improve ?Outcome: Adequate for Discharge ?  ?Problem: Safety: ?Goal: Periods of time without injury will increase ?Outcome: Adequate for Discharge ?  ?Problem: Education: ?Goal: Ability to make informed decisions regarding treatment will improve ?Outcome: Adequate for Discharge ?  ?Problem: Medication: ?Goal: Compliance with prescribed medication regimen will improve ?Outcome: Adequate for Discharge ?  ?Problem: Medication: ?Goal: Compliance with prescribed medication regimen will improve ?Outcome: Adequate for Discharge ?  ?

## 2021-06-05 NOTE — [facility] Suicide Risk Assessment (Signed)
[facility] Discharge Suicide Risk Assessment ? ? ?Principal Problem: Polysubstance abuse (HCC) ?Discharge Diagnoses: Principal Problem: ?  Polysubstance abuse (HCC) ?Active Problems: ?  Substance induced mood disorder (HCC) ? ? ?Total Time spent with patient: 30 minutes ? ?Musculoskeletal: ?Strength & Muscle Tone: within normal limits ?Gait & Station: normal ?Patient leans: N/A ? ?Psychiatric Specialty Exam ? ?Presentation  ?General Appearance: Appropriate for Environment ? ?Eye Contact:Good ? ?Speech:Clear and Coherent ? ?Speech Volume:Normal ? ?Handedness:Right ? ? ?Mood and Affect  ?Mood:Depressed ? ?Duration of Depression Symptoms: Greater than two weeks ? ?Affect:Appropriate ? ? ?Thought Process  ?Thought Processes:Coherent ? ?Descriptions of Associations:Intact ? ?Orientation:Full (Time, Place and Person) ? ?Thought Content:Logical ? ?History of Schizophrenia/Schizoaffective disorder:No ? ?Duration of Psychotic Symptoms:No data recorded ?Hallucinations:No data recorded ?Ideas of Reference:None ? ?Suicidal Thoughts:No data recorded ?Homicidal Thoughts:No data recorded ? ?Sensorium  ?Memory:Immediate Good; Recent Good; Remote Good ? ?Judgment:Poor ? ?Insight:Poor ? ? ?Executive Functions  ?Concentration:Fair ? ?Attention Span:Fair ? ?Recall:Fair ? ?Fund of Knowledge:Fair ? ?Language:Fair ? ? ?Psychomotor Activity  ?Psychomotor Activity:No data recorded ? ?Assets  ?Assets:Desire for Improvement; Resilience; Social Support ? ? ?Sleep  ?Sleep:No data recorded ? ?Physical Exam: ?Physical Exam ?Vitals and nursing note reviewed.  ?Constitutional:   ?   Appearance: Normal appearance.  ?HENT:  ?   Head: Normocephalic and atraumatic.  ?   Mouth/Throat:  ?   Pharynx: Oropharynx is clear.  ?Eyes:  ?   Pupils: Pupils are equal, round, and reactive to light.  ?Cardiovascular:  ?   Rate and Rhythm: Normal rate and regular rhythm.  ?Pulmonary:  ?   Effort: Pulmonary effort is normal.  ?   Breath sounds: Normal breath sounds.   ?Abdominal:  ?   General: Abdomen is flat.  ?   Palpations: Abdomen is soft.  ?Musculoskeletal:     ?   General: Normal range of motion.  ?Skin: ?   General: Skin is warm and dry.  ?Neurological:  ?   General: No focal deficit present.  ?   Mental Status: He is alert. Mental status is at baseline.  ?Psychiatric:     ?   Mood and Affect: Mood normal.     ?   Thought Content: Thought content normal.  ? ?Review of Systems  ?Constitutional: Negative.   ?HENT: Negative.    ?Eyes: Negative.   ?Respiratory: Negative.    ?Cardiovascular: Negative.   ?Gastrointestinal: Negative.   ?Musculoskeletal: Negative.   ?Skin: Negative.   ?Neurological: Negative.   ?Psychiatric/Behavioral: Negative.    ?Blood pressure 124/77, pulse 79, temperature 98.2 ?F (36.8 ?C), temperature source Oral, resp. rate 18, height 5\' 4"  (1.626 m), weight 54.4 kg, SpO2 100 %. Body mass index is 20.6 kg/m?. ? ?Mental Status Per Nursing Assessment::   ?On Admission:  Self-harm thoughts ? ?Demographic Factors:  ?Male and Adolescent or young adult ? ?Loss Factors: ?NA ? ?Historical Factors: ?Impulsivity ? ?Risk Reduction Factors:   ?Sense of responsibility to family, Positive social support, and Positive coping skills or problem solving skills ? ?Continued Clinical Symptoms:  ?Depression:   Impulsivity ? ?Cognitive Features That Contribute To Risk:  ?None   ? ?Suicide Risk:  ?Minimal: No identifiable suicidal ideation.  Patients presenting with no risk factors but with morbid ruminations; may be classified as minimal risk based on the severity of the depressive symptoms ? ? Follow-up Information   ? ? [id number] Go on 06/08/2021.   ?Why: Please meet 08/08/2021, peer support bridge, at  [facility] on Monday at 0730. ?Contact information: ?[address] ?[place] [address] ?[phone] ? ? ?  ?  ? ?  ?  ? ?  ? ? ?Plan Of Care/Follow-up recommendations:  ?Patient denies suicidal ideation.  Mood and affect are upbeat.  Patient is agreeable to follow  up with [facility]. ? ?Mordecai Rasmussen, MD ?06/05/2021, 10:35 AM ?

## 2021-06-05 NOTE — Progress Notes (Signed)
Recreation Therapy Notes ? ? ?Date: 06/05/2021  ? ?Time: 10:30 am   ? ?Location: Craft room   ? ?Behavioral response: Appropriate ? ?Intervention Topic:  Communication  ? ?Discussion/Intervention:  ?Group content today was focused on communication. The group defined communication and ways to communicate with others. Individuals stated reason why communication is important and some reasons to communicate with others. Patients expressed if they thought they were good at communicating with others and ways they could improve their communication skills. The group identified important parts of communication and some experiences they have had in the past with communication. The group participated in the intervention where they had a chance to test out their communication skills and identify ways to improve their communication techniques.  ? ?Clinical Observations/Feedback: ?Patient came to group and expressed that he communicates by using social media. He stated that he could improve his social skills by getting a job and socializing more. Participant expressed that he could improve his communication skills by getting a job and socializing more. Individual was social with peers and staff while participating in the intervention.   ?Edward Shelton LRT/CTRS  ? ? ? ? ? ? ? ?Edward Shelton ?06/05/2021 12:26 PM ?

## 2021-06-05 NOTE — Group Note (Signed)
[facility] LCSW Group Therapy Note ? ? ?Group Date: 06/05/2021 ?Start Time: 1300 ?End Time: 1400 ? ?Type of Therapy and Topic:  Group Therapy:  Feelings around Relapse and Recovery ? ?Participation Level:  Did Not Attend  ? ? ?Description of Group:   ? Patients in this group will discuss emotions they experience before and after a relapse. They will process how experiencing these feelings, or avoidance of experiencing them, relates to having a relapse. Facilitator will guide patients to explore emotions they have related to recovery. Patients will be encouraged to process which emotions are more powerful. They will be guided to discuss the emotional reaction significant others in their lives may have to patients? relapse or recovery. Patients will be assisted in exploring ways to respond to the emotions of others without this contributing to a relapse. ? ?Therapeutic Goals: ?Patient will identify two or more emotions that lead to relapse for them:  ?Patient will identify two emotions that result when they relapse:  ?Patient will identify two emotions related to recovery:  ?Patient will demonstrate ability to communicate their needs through discussion and/or role plays. ? ? ?Summary of Patient Progress: ?X ? ? ?Therapeutic Modalities:   ?Cognitive Behavioral Therapy ?Solution-Focused Therapy ?Assertiveness Training ?Relapse Prevention Therapy ? ? ?Ayden Apodaca R Vernis Eid, LCSW ?

## 2021-06-05 NOTE — Discharge Summary (Signed)
Physician Discharge Summary Note ? ?Patient:  Edward Shelton is an 21 y.o., male ?MRN:  [id number] ?DOB:  Oct 03, 2000 ?Patient phone:  [phone] (home)  ?Patient address:   ?[address] ?[facility] [facility] [place] [address],  ?Total Time spent with patient: 30 minutes ? ?Date of Admission:  06/03/2021 ?Date of Discharge: 06/05/2021 ? ?Reason for Admission: Admitted after presenting voluntarily to the emergency room with report of some recent suicidal thoughts and longstanding issues with mood and anxiety ? ?Principal Problem: Polysubstance abuse ([place]) ?Discharge Diagnoses: Principal Problem: ?  Polysubstance abuse ([place]) ?Active Problems: ?  Substance induced mood disorder ([place]) ? ? ?Past Psychiatric History: None reported ? ?Past Medical History: History reviewed. No pertinent past medical history. History reviewed. No pertinent surgical history. ?Family History:  ?Family History  ?Problem Relation Age of Onset  ? Healthy Mother   ? Healthy Father   ? ?Family Psychiatric  History: None reported ?Social History:  ?Social History  ? ?Substance and Sexual Activity  ?Alcohol Use Yes  ?   ?Social History  ? ?Substance and Sexual Activity  ?Drug Use Yes  ? Types: Marijuana  ? Comment: shrooms, MDMA, ketamine  ?  ?Social History  ? ?Socioeconomic History  ? Marital status: Single  ?  Spouse name: Not on file  ? Number of children: Not on file  ? Years of education: Not on file  ? Highest education level: Not on file  ?Occupational History  ? Not on file  ?Tobacco Use  ? Smoking status: Never  ? Smokeless tobacco: Never  ?Vaping Use  ? Vaping Use: Some days  ?Substance and Sexual Activity  ? Alcohol use: Yes  ? Drug use: Yes  ?  Types: Marijuana  ?  Comment: shrooms, MDMA, ketamine  ? Sexual activity: Yes  ?Other Topics Concern  ? Not on file  ?Social History Narrative  ? Not on file  ? ?Social Determinants of Health  ? ?Financial Resource Strain: Not on file  ?Food Insecurity: Not on file  ?Transportation Needs: Not on file   ?Physical Activity: Not on file  ?Stress: Not on file  ?Social Connections: Not on file  ? ? ?Hospital Course: Patient admitted to psychiatric hospital.  15-minute checks continued.  Patient did not display any dangerous aggressive or suicidal behavior.  He was cooperative with full evaluation by the treatment team.  Participated appropriately in evaluation in groups.  Based on my evaluation of the patient it was unclear to me whether medications were indicated and out of concern for not wanting to cause worsening problems he was not started on any medications.  He appears to be at no risk of self-harm right now.  He is strongly encouraged to avoid getting back into drug abuse which seems to have been a major part of his problem and to engage in appropriate outpatient treatment at discharge.  He has met with the representative from [facility] and is agreeing to follow-up this upcoming week. ? ?Physical Findings: ?AIMS:  , ,  ,  ,    ?CIWA:    ?COWS:    ? ?Musculoskeletal: ?Strength & Muscle Tone: within normal limits ?Gait & Station: normal ?Patient leans: N/A ? ? ?Psychiatric Specialty Exam: ? ?Presentation  ?General Appearance: Appropriate for Environment ? ?Eye Contact:Good ? ?Speech:Clear and Coherent ? ?Speech Volume:Normal ? ?Handedness:Right ? ? ?Mood and Affect  ?Mood:Depressed ? ?Affect:Appropriate ? ? ?Thought Process  ?Thought Processes:Coherent ? ?Descriptions of Associations:Intact ? ?Orientation:Full (Time, Place and Person) ? ?Thought Content:Logical ? ?  History of Schizophrenia/Schizoaffective disorder:No ? ?Duration of Psychotic Symptoms:No data recorded ?Hallucinations:No data recorded ?Ideas of Reference:None ? ?Suicidal Thoughts:No data recorded ?Homicidal Thoughts:No data recorded ? ?Sensorium  ?Memory:Immediate Good; Recent Good; Remote Good ? ?Judgment:Poor ? ?Insight:Poor ? ? ?Executive Functions  ?Concentration:Fair ? ?Attention Span:Fair ? ?Recall:Fair ? ?[place] ? ?Language:Fair ? ? ?Psychomotor Activity  ?Psychomotor Activity:No data recorded ? ?Assets  ?Assets:Desire for Improvement; Resilience; Social Support ? ? ?Sleep  ?Sleep:No data recorded ? ? ?Physical Exam: ?Physical Exam ?Vitals and nursing note reviewed.  ?Constitutional:   ?   Appearance: Normal appearance.  ?HENT:  ?   Head: Normocephalic and atraumatic.  ?   Mouth/Throat:  ?   Pharynx: Oropharynx is clear.  ?Eyes:  ?   Pupils: Pupils are equal, round, and reactive to light.  ?Cardiovascular:  ?   Rate and Rhythm: Normal rate and regular rhythm.  ?Pulmonary:  ?   Effort: Pulmonary effort is normal.  ?   Breath sounds: Normal breath sounds.  ?Abdominal:  ?   General: Abdomen is flat.  ?   Palpations: Abdomen is soft.  ?Musculoskeletal:     ?   General: Normal range of motion.  ?Skin: ?   General: Skin is warm and dry.  ?Neurological:  ?   General: No focal deficit present.  ?   Mental Status: He is alert. Mental status is at baseline.  ?Psychiatric:     ?   Mood and Affect: Mood normal.     ?   Thought Content: Thought content normal.  ? ?Review of Systems  ?Constitutional: Negative.   ?HENT: Negative.    ?Eyes: Negative.   ?Respiratory: Negative.    ?Cardiovascular: Negative.   ?Gastrointestinal: Negative.   ?Musculoskeletal: Negative.   ?Skin: Negative.   ?Neurological: Negative.   ?Psychiatric/Behavioral: Negative.    ?Blood pressure 124/77, pulse 79, temperature 98.2 ?F (36.8 ?C), temperature source Oral, resp. rate 18, height $RemoveBe'5\' 4"'HPcPwtwai$  (1.626 m), weight 54.4 kg, SpO2 100 %. Body mass index is 20.6 kg/m?. ? ? ?Social History  ? ?Tobacco Use  ?Smoking Status Never  ?Smokeless Tobacco Never  ? ?Tobacco Cessation:  N/A, patient does not currently use tobacco products ? ? ?Blood Alcohol level:  ?Lab Results  ?Component Value Date  ? ETH <10 06/03/2021  ? ETH <10 05/31/2021  ? ? ?Metabolic Disorder Labs:  ?No results found for: HGBA1C, MPG ?No results found for: PROLACTIN ?No results found for: CHOL,  TRIG, HDL, CHOLHDL, VLDL, LDLCALC ? ?See Psychiatric Specialty Exam and Suicide Risk Assessment completed by Attending Physician prior to discharge. ? ?Discharge destination:  Home ? ?Is patient on multiple antipsychotic therapies at discharge:  No   ?Has Patient had three or more failed trials of antipsychotic monotherapy by history:  No ? ?Recommended Plan for Multiple Antipsychotic Therapies: ?NA ? ?Discharge Instructions   ? ? Diet - low sodium heart healthy   Complete by: As directed ?  ? Increase activity slowly   Complete by: As directed ?  ? ?  ? ?Allergies as of 06/05/2021   ?No Known Allergies ?  ? ?  ?Medication List  ?  ? ?STOP taking these medications   ? ?CLARITIN PO ?  ? ?  ? ? Follow-up Information   ? ? [place] on 06/08/2021.   ?Why: Please meet Lanae Boast, peer support bridge, at [facility] on Monday at 0730. ?Contact information: ?[address] ?[place] [address] ?[phone] ? ? ?  ?  ? ?  ?  ? ?  ? ? ?  Follow-up recommendations: Follow up with [facility].  Do not get back into drug abuse ? ?Comments: No prescriptions required ? ?Signed: ?Alethia Berthold, MD ?06/05/2021, 10:38 AM ? ? ? ? ? ? ? ?

## 2021-08-31 ENCOUNTER — Emergency Department
Admission: EM | Admit: 2021-08-31 | Discharge: 2021-09-01 | Disposition: A | Discharge status: Other Acute Inpatient Hospital | Payer: Medicaid Other | Attending: Emergency Medicine | Admitting: Emergency Medicine

## 2021-08-31 ENCOUNTER — Other Ambulatory Visit: Payer: Self-pay

## 2021-08-31 ENCOUNTER — Encounter: Payer: Self-pay | Admitting: *Deleted

## 2021-08-31 DIAGNOSIS — F1994 Other psychoactive substance use, unspecified with psychoactive substance-induced mood disorder: Secondary | ICD-10-CM | POA: Diagnosis present

## 2021-08-31 DIAGNOSIS — F1910 Other psychoactive substance abuse, uncomplicated: Secondary | ICD-10-CM | POA: Diagnosis present

## 2021-08-31 DIAGNOSIS — F1914 Other psychoactive substance abuse with psychoactive substance-induced mood disorder: Secondary | ICD-10-CM | POA: Insufficient documentation

## 2021-08-31 DIAGNOSIS — Z20822 Contact with and (suspected) exposure to covid-19: Secondary | ICD-10-CM | POA: Insufficient documentation

## 2021-08-31 DIAGNOSIS — Z046 Encounter for general psychiatric examination, requested by authority: Secondary | ICD-10-CM | POA: Diagnosis present

## 2021-08-31 DIAGNOSIS — R45851 Suicidal ideations: Secondary | ICD-10-CM

## 2021-08-31 DIAGNOSIS — F1019 Alcohol abuse with unspecified alcohol-induced disorder: Secondary | ICD-10-CM | POA: Diagnosis not present

## 2021-08-31 LAB — CBC
HCT: 46.8 % (ref 39.0–52.0)
Hemoglobin: 16.2 g/dL (ref 13.0–17.0)
MCH: 29.6 pg (ref 26.0–34.0)
MCHC: 34.6 g/dL (ref 30.0–36.0)
MCV: 85.4 fL (ref 80.0–100.0)
Platelets: 222 10*3/uL (ref 150–400)
RBC: 5.48 MIL/uL (ref 4.22–5.81)
RDW: 13.2 % (ref 11.5–15.5)
WBC: 9.2 10*3/uL (ref 4.0–10.5)
nRBC: 0 % (ref 0.0–0.2)

## 2021-08-31 LAB — URINE DRUG SCREEN, QUALITATIVE (ARMC ONLY)
Amphetamines, Ur Screen: NOT DETECTED
Barbiturates, Ur Screen: NOT DETECTED
Benzodiazepine, Ur Scrn: NOT DETECTED
Cannabinoid 50 Ng, Ur ~~LOC~~: POSITIVE — AB
Cocaine Metabolite,Ur ~~LOC~~: NOT DETECTED
MDMA (Ecstasy)Ur Screen: NOT DETECTED
Methadone Scn, Ur: NOT DETECTED
Opiate, Ur Screen: NOT DETECTED
Phencyclidine (PCP) Ur S: NOT DETECTED
Tricyclic, Ur Screen: NOT DETECTED

## 2021-08-31 LAB — RESP PANEL BY RT-PCR (FLU A&B, COVID) ARPGX2
Influenza A by PCR: NEGATIVE
Influenza B by PCR: NEGATIVE
SARS Coronavirus 2 by RT PCR: NEGATIVE

## 2021-08-31 LAB — ACETAMINOPHEN LEVEL: Acetaminophen (Tylenol), Serum: <10 ug/mL — ABNORMAL LOW (ref 10–30)

## 2021-08-31 LAB — COMPREHENSIVE METABOLIC PANEL
ALT: 15 U/L (ref 0–44)
AST: 19 U/L (ref 15–41)
Albumin: 5 g/dL (ref 3.5–5.0)
Alkaline Phosphatase: 78 U/L (ref 38–126)
Anion gap: 8 (ref 5–15)
BUN: 14 mg/dL (ref 6–20)
CO2: 26 mmol/L (ref 22–32)
Calcium: 9.7 mg/dL (ref 8.9–10.3)
Chloride: 106 mmol/L (ref 98–111)
Creatinine, Ser: 0.81 mg/dL (ref 0.61–1.24)
GFR, Estimated: >60 mL/min (ref >60)
Glucose, Bld: 91 mg/dL (ref 70–99)
Potassium: 3.6 mmol/L (ref 3.5–5.1)
Sodium: 140 mmol/L (ref 135–145)
Total Bilirubin: 1.3 mg/dL — ABNORMAL HIGH (ref 0.3–1.2)
Total Protein: 7.8 g/dL (ref 6.5–8.1)

## 2021-08-31 LAB — SALICYLATE LEVEL: Salicylate Lvl: <7 mg/dL — ABNORMAL LOW (ref 7.0–30.0)

## 2021-08-31 LAB — ETHANOL: Alcohol, Ethyl (B): <10 mg/dL (ref <10)

## 2021-08-31 NOTE — ED Provider Notes (Signed)
[facility] Provider Note    Event Date/Time   First MD Initiated Contact with Patient 08/31/21 1957     (approximate)   History   Psychiatric Evaluation   HPI  Edward Shelton is a 21 y.o. male with a history of substance abuse, suicidality who presents with complaints of suicidal ideation.  Patient reports he is continue to feel suicidal since being admitted in March of this year.  He reports self cutting, primarily to the left arm,      Physical Exam   Triage Vital Signs: ED Triage Vitals  Enc Vitals Group     BP 08/31/21 1930 (!) 144/86     Pulse Rate 08/31/21 1930 74     Resp 08/31/21 1930 18     Temp 08/31/21 1930 97.7 F (36.5 C)     Temp Source 08/31/21 1930 Oral     SpO2 08/31/21 1930 98 %     Weight 08/31/21 1931 54.4 kg (120 lb)     Height 08/31/21 1931 1.6 m (5\' 3" )     Head Circumference --      Peak Flow --      Pain Score 08/31/21 1931 0     Pain Loc --      Pain Edu? --      Excl. in GC? --     Most recent vital signs: Vitals:   08/31/21 1930  BP: (!) 144/86  Pulse: 74  Resp: 18  Temp: 97.7 F (36.5 C)  SpO2: 98%     General: Awake, no distress.  CV:  Good peripheral perfusion.  Resp:  Normal effort.  Abd:  No distention.  Other:  Shallow lacerations to the left arm in various stages of healing, no infection   ED Results / Procedures / Treatments   Labs (all labs ordered are listed, but only abnormal results are displayed) Labs Reviewed  COMPREHENSIVE METABOLIC PANEL - Abnormal; Notable for the following components:      Result Value   Total Bilirubin 1.3 (*)    All other components within normal limits  SALICYLATE LEVEL - Abnormal; Notable for the following components:   Salicylate Lvl <7.0 (*)    All other components within normal limits  ACETAMINOPHEN LEVEL - Abnormal; Notable for the following components:   Acetaminophen (Tylenol), Serum <10 (*)    All other components within normal limits   RESP PANEL BY RT-PCR (FLU A&B, COVID) ARPGX2  ETHANOL  CBC  URINE DRUG SCREEN, QUALITATIVE ([facility] ONLY)     EKG     RADIOLOGY     PROCEDURES:  Critical Care performed:   Procedures   MEDICATIONS ORDERED IN ED: Medications - No data to display   IMPRESSION / MDM / ASSESSMENT AND PLAN / ED COURSE  I reviewed the triage vital signs and the nursing notes. Patient's presentation is most consistent with acute presentation with potential threat to life or bodily function.  Patient presents with suicidal ideation, he is voluntary  Multiple shallow lacerations to the left arm, none require suturing or repair  Lab work is reviewed and is unremarkable, CMP CBC acetaminophen and salicylate are all normal  Patient is cleared for psychiatric consultation  The patient has been placed in psychiatric observation due to the need to provide a safe environment for the patient while obtaining psychiatric consultation and evaluation, as well as ongoing medical and medication management to treat the patient's condition.  The patient has not been placed under full  IVC at this time.       FINAL CLINICAL IMPRESSION(S) / ED DIAGNOSES   Final diagnoses:  Suicidal ideation     Rx / DC Orders   ED Discharge Orders     None        Note:  This document was prepared using Dragon voice recognition software and may include unintentional dictation errors.   Jene Every, MD 08/31/21 2037

## 2021-08-31 NOTE — ED Notes (Signed)
Black book bag Midwife Black sweat pants [employer] stud Equities trader boxers  Wallace Cullens footie socks

## 2021-08-31 NOTE — ED Notes (Signed)
Pt reports SI with no specific plan. States that he has thoughts come and go. Is cutting arm so that he does not do drugs. States that he has felt SI since he left the hospital in March. Has cut self, superficial on left forearm - states 3 times in last week.

## 2021-08-31 NOTE — ED Notes (Signed)
Pt. To BHU from ED ambulatory without difficulty, to room  BHU 5. Report from [facility]. Pt. Is alert and oriented, warm and dry in no distress. Pt. Denies SI, HI, and AVH. Pt. Calm and cooperative. Pt states he will need to charge his ankle monitor in the morning.  Writer advised I would pas info along to first shift. Pt. Made aware of security cameras and Q15 minute rounds. Pt. Encouraged to let Nursing staff know of any concerns or needs.

## 2021-08-31 NOTE — ED Triage Notes (Addendum)
Pt reports SI since he left here in March 2023.  Pt has recent cuts marks to left forearm. Superficial abrasions to arm.   Denies drugs or etoh use   States he used an eyebrow cutter. Pt tearful .   Pt wearing ankle monitor on right ankle

## 2021-08-31 NOTE — ED Notes (Signed)
Patient transferred from Triage to room 24H after dressing out and screening for contraband. Report received from [place], [place] including situation, background, assessment and recommendations. Pt oriented to [employer] including Q15 minute rounds as well as Psychologist, counselling for their protection. Patient is alert and oriented, warm and dry in no acute distress. Patient denies HI and AVH. Pt. Encouraged to let this nurse know if needs arise.

## 2021-08-31 NOTE — BH Assessment (Addendum)
Comprehensive Clinical Assessment (CCA) Note  08/31/2021 Edward KarvonenSteven Lopez Shelton Edward Shelton [id number] Recommendations for Services/Supports/Treatments: Consulted with Madaline BrilliantJackie T., NP, who determined pt. meets inpatient psychiatric criteria. Notified Dr. Sidney AceMcHugh and Thayer Ohmhris, RN of disposition recommendation.   Edward KarvonenSteven Lopez Shelton Edward Shelton is a 21 year old, English speaking, Biracial Hispanic male with a hx of polysubstance abuse and substance induced mood disorder. Per pt.'s first note: Pt reports SI since he left here in March 2023.  Pt has recent cuts marks to left forearm. Superficial abrasions to arm. Upon assessment pt. explained that his depression and intrusive thoughts have worsened since being discharged in March. Pt reported that he does not feel safe going back home, due to having thoughts of SI and being surrounded by friends that use. Pt expressed that his emotions feel "broken every day" and admitted to daily crying spells. Pt admitted to NSSIB with his last cutting episode being 3 days ago. Pt unable to identify specific stressors. Pt's protective factors include stable housing, self-employment, being physically healthy. Pt reported that he is not connected to any services at this time. Pt had an anxious affect and a depressed mood. Pt was cooperative throughout the interview. Pt was guarded about his cannabis use, on one hand describing it as "not his drug of choice" and on the other defending its benefits. Pt reported that he has quit all other substances. Pt reported that he was put on an ankle monitor for refusing to leave a friend's property after getting drunk on tequila. Pt denied being a regular drinker. Pt had clear speech and was oriented x4. Pt denied current HI/AV/H. Pt's BAL is unremarkable; UDS is +for cannabis.     Chief Complaint:  Chief Complaint  Patient presents with   Psychiatric Evaluation   Visit Diagnosis: Substance induced mood disorder    CCA Screening, Triage and Referral  (STR)  Patient Reported Information How did you hear about us? Self  Referral name: No data recorded Referral phone number: No data recorded  Whom do you see for routine medical problems? No data recorded Practice/Facility Name: No data recorded Practice/Facility Phone Number: No data recorded Name of Contact: No data recorded Contact Number: No data recorded Contact Fax Number: No data recorded Prescriber Name: No data recorded Prescriber Address (if known): No data recorded  What Is the Reason for Your Visit/Call Today? Pt reports SI since he left here in March 2023.  Pt has recent cuts marks to left forearm. Superficial abrasions to arm.   Denies drugs or etoh use   States he used an eyebrow cutter. Pt tearful .   Pt wearing ankle monitor on right ankle  How Long Has This Been Causing You Problems? > than 6 months  What Do You Feel Would Help You the Most Today? Treatment for Depression or other mood problem; Medication(s)   Have You Recently Been in Any Inpatient Treatment (Hospital/Detox/Crisis Center/28-Day Program)? No data recorded Name/Location of Program/Hospital:No data recorded How Long Were You There? No data recorded When Were You Discharged? No data recorded  [facility] You [facility]? No data recorded Who [facility]? No data recorded  Have You Recently Had Any Thoughts About Hurting Yourself? Yes  Are You Planning to Commit Suicide/Harm Yourself At This time? No   Have you Recently Had Thoughts About Hurting Someone Karolee Ohslse? No  Explanation: No data recorded  Have You Used Any Alcohol or Drugs in the Past 24 Hours? Yes  How Long Ago Did  You Use Drugs or Alcohol? No data recorded What Did You Use and How Much? Cannabis   Do You Currently Have a Therapist/Psychiatrist? No  Name of Therapist/Psychiatrist: No data recorded  Have You Been Recently Discharged From Any Office Practice or Programs? No  Explanation  of Discharge From Practice/Program: No data recorded    CCA Screening Triage Referral Assessment Type of Contact: Face-to-Face  Is this Initial or Reassessment? No data recorded Date Telepsych consult ordered in [facility]:  No data recorded Time Telepsych consult ordered in [facility]:  No data recorded  Patient Reported Information Reviewed? No data recorded Patient Left Without Being Seen? No data recorded Reason for Not Completing Assessment: No data recorded  Collateral Involvement: None provided   Does Patient Have a Court Appointed Legal Guardian? No data recorded Name and Contact of Legal Guardian: No data recorded If Minor and Not Living with Parent(s), Who has Custody? n/a  Is CPS involved or ever been involved? Never  Is APS involved or ever been involved? Never   Patient Determined To Be At Risk for Harm To Self or Others Based on Review of Patient Reported Information or Presenting Complaint? No  Method: No data recorded Availability of Means: No data recorded Intent: No data recorded Notification Required: No data recorded Additional Information for Danger to Others Potential: No data recorded Additional Comments for Danger to Others Potential: No data recorded Are There Guns or Other Weapons in Your Home? No data recorded Types of Guns/Weapons: No data recorded Are These Weapons Safely Secured?                            No data recorded Who Could Verify You Are Able To Have These Secured: No data recorded Do You Have any Outstanding Charges, Pending Court Dates, Parole/Probation? No data recorded Contacted To Inform of Risk of Harm To Self or Others: No data recorded  [facility] of Assessment: [facility] ED   Does Patient Present under Involuntary Commitment? No  IVC Papers Initial File Date: No data recorded  [place] of Residence: [place]   Patient Currently Receiving the Following Services: Not Receiving Services   Determination of Need: Emergent (2  hours)   Options For Referral: Inpatient Hospitalization; ED Referral     CCA Biopsychosocial Intake/Chief Complaint:  No data recorded Current Symptoms/Problems: No data recorded  Patient Reported Schizophrenia/Schizoaffective Diagnosis in Past: No   Strengths: Pt has good insight; pt is willing to participate in treatment; pt has stable housing; pt is employed.  Preferences: No data recorded Abilities: No data recorded  Type of Services Patient Feels are Needed: No data recorded  Initial Clinical Notes/Concerns: No data recorded  Mental Health Symptoms Depression:   Tearfulness; Hopelessness; Sleep (too much or little); Change in energy/activity   Duration of Depressive symptoms:  Greater than two weeks   Mania:   N/A   Anxiety:    Worrying; Tension   Psychosis:   None   Duration of Psychotic symptoms: No data recorded  Trauma:   None   Obsessions:   Cause anxiety; Disrupts routine/functioning; Attempts to suppress/neutralize; Recurrent & persistent thoughts/impulses/images   Compulsions:   Intended to reduce stress or prevent another outcome; "Driven" to perform behaviors/acts; Repeated behaviors/mental acts; Poor Insight   Inattention:   None   Hyperactivity/Impulsivity:   None   Oppositional/Defiant Behaviors:   None   Emotional Irregularity:   Mood lability; Potentially harmful impulsivity; Recurrent suicidal behaviors/gestures/threats  Other Mood/Personality Symptoms:  No data recorded   Mental Status Exam Appearance and self-care  Stature:   Small   Weight:   Average weight   Clothing:   -- (Scrubs)   Grooming:   Normal   Cosmetic use:   None   Posture/gait:   Normal   Motor activity:   Not Remarkable   Sensorium  Attention:   Normal   Concentration:   Normal   Orientation:   Situation; Place; Person; Object   Recall/memory:   Normal   Affect and Mood  Affect:   Flat   Mood:   Depressed   Relating   Eye contact:   Fleeting   Facial expression:   Responsive   Attitude toward examiner:   Cooperative   Thought and Language  Speech flow:  Clear and Coherent   Thought content:   Appropriate to Mood and Circumstances   Preoccupation:   None   Hallucinations:   None   Organization:  No data recorded  [employer] of Knowledge:   Average   Intelligence:   Average   Abstraction:   Normal   Judgement:   Fair   Reality Testing:   Adequate   Insight:   Uses connections; Flashes of insight   Decision Making:   Impulsive   Social Functioning  Social Maturity:   Impulsive   Social Judgement:   Impropriety   Stress  Stressors:   Other (Comment) (Depression)   Coping Ability:   Exhausted   Skill Deficits:   Decision making   Supports:   Family; Support needed     Religion: Religion/Spirituality Are You A Religious Person?:  (Not assessed) How Might This Affect Treatment?: Not assessed  Leisure/Recreation: Leisure / Recreation Do You Have Hobbies?: Yes Leisure and Hobbies: soccer, music  Exercise/Diet: Exercise/Diet Do You Exercise?: Yes What Type of Exercise Do You Do?: Run/Walk, Weight Training How Many Times a Week Do You Exercise?: Daily Have You Gained or Lost A Significant Amount of Weight in the Past Six Months?: No Do You Follow a Special Diet?: No Do You Have Any Trouble Sleeping?: No   CCA Employment/Education Employment/Work Situation: Employment / Work Situation Employment Situation: Employed Work Stressors: None Patient's Job has Been Impacted by Current Illness: No Has Patient ever Been in Equities trader?: No  Education: Education Is Patient Currently Attending School?: No Last Grade Completed: 12 Did You Product manager?: No Did You Have An Individualized Education Program (IIEP): No Did You Have Any Difficulty At [employer]?: No Patient's Education Has Been Impacted by Current Illness: No   CCA  Family/Childhood History Family and Relationship History: Family history Marital status: Single Does patient have children?: No  Childhood History:  Childhood History By whom was/is the patient raised?: Mother/father and step-parent Did patient suffer any verbal/emotional/physical/sexual abuse as a child?: No Did patient suffer from severe childhood neglect?: No Has patient ever been sexually abused/assaulted/raped as an adolescent or adult?: No Was the patient ever a victim of a crime or a disaster?: No Witnessed domestic violence?: No Has patient been affected by domestic violence as an adult?: No  Child/Adolescent Assessment:     CCA Substance Use Alcohol/Drug Use: Alcohol / Drug Use Pain Medications: See MAR Prescriptions: See MAR Over the Counter: See MAR History of alcohol / drug use?: Yes Longest period of sobriety (when/how long): Unknown Negative Consequences of Use: Personal relationships, Legal Withdrawal Symptoms: Tremors  ASAM's:  Six Dimensions of Multidimensional Assessment  Dimension 1:  Acute Intoxication and/or Withdrawal Potential:   Dimension 1:  Description of individual's past and current experiences of substance use and withdrawal: Pt has a hx of polysubstance abuse  Dimension 2:  Biomedical Conditions and Complications:   Dimension 2:  Description of patient's biomedical conditions and  complications: 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  Dimension 6:  Recovery/Living Environment:  Dimension 6:  Recovery/Iiving environment criteria description: Pt reported that he lives with friends who use.  ASAM Severity Score: ASAM's Severity Rating Score: 20  ASAM Recommended Level of Treatment: ASAM Recommended Level of Treatment: Level III Residential Treatment   Substance use Disorder (SUD) Substance Use Disorder (SUD)  Checklist Symptoms of Substance Use: Continued use despite persistent or recurrent social, interpersonal problems, caused or exacerbated by use, Continued use despite having a persistent/recurrent physical/psychological problem caused/exacerbated by use, Evidence of tolerance, Evidence of withdrawal (Comment), Large amounts of time spent to obtain, use or recover from the substance(s), Persistent desire or unsuccessful efforts to cut down or control use  Recommendations for Services/Supports/Treatments: Recommendations for Services/Supports/Treatments Recommendations For Services/Supports/Treatments: Inpatient Hospitalization  DSM5 Diagnoses: Patient Active Problem List   Diagnosis Date Noted   Substance induced mood disorder (HCC) 06/04/2021   Polysubstance abuse (HCC) 06/03/2021    Dorina Ribaudo R [place], LCAS

## 2021-09-01 DIAGNOSIS — F1994 Other psychoactive substance use, unspecified with psychoactive substance-induced mood disorder: Secondary | ICD-10-CM | POA: Diagnosis not present

## 2021-09-01 NOTE — ED Notes (Signed)
Call [facility] to call report  [phone]. No answer this is the 5th attempt this a.m.

## 2021-09-01 NOTE — BH Assessment (Signed)
Patient has been accepted to [facility].  Patient assigned to [facility]. Accepting physician is Dr. Otho Perl.  Call report to [phone].  Representative was [employer].   ER Staff is aware of it:  Bonita Quin, ER Secretary  Dr. Delton Prairie, ER MD  Selena Batten, Patient's Nurse     Patient's bed available anytime after 9 AM 09/01/21.

## 2021-09-01 NOTE — Consult Note (Signed)
[facility] Consult   Reason for Consult: Psychiatric Evaluation Referring Physician: Dr. Corky Downs Patient Identification: Edward Shelton MRN:  [id number] Principal Diagnosis: <principal problem not specified> Diagnosis:  Active Problems:   Polysubstance abuse ([place])   Substance induced mood disorder ([place])   Total Time spent with patient: 1 hour  Subjective: "I am scared to be by myself. I am not sure what I will do."  Edward Shelton is a 21 y.o. male patient presented to [facility] ED voluntarily. Per the triage nurse note, Pt reports SI since he left here in March 2023.  Pt has recent cuts marks to left forearm. Superficial abrasions to arm. Denies drugs or etoh use   States he used an eyebrow cutter. Pt tearful .   Pt wearing ankle monitor on right ankle.   The patient shared that he does not feel safe returning home; he admitted to having thoughts of SI and being surrounded by friends who use it. The patient expressed that his emotions feel "broken every day" and admitted to daily crying spells. The patient shared that SIB with his last cutting episode was three days ago. The patient is unable to identify specific stressors. The patient's protective factors include stable housing, self-employment, and physical health. The patient reported that he is not connected to any services now. The patient had an anxious affect and a depressed mood.  This provider saw The patient face-to-face; the chart was reviewed, and consulted with Dr. Corky Downs on 08/31/2021 due to the patient's care. It was discussed with the EDP that the patient does meet the criteria to be admitted to the psychiatric inpatient unit.  On evaluation, the patient is alert and oriented x 4, calm, cooperative, and mood-congruent with affect. The patient does not appear to be responding to internal or external stimuli. Neither is the patient presenting with any delusional thinking. The patient denies auditory or visual  hallucinations. The patient denies any active suicidal, homicidal, or self-harm ideations. The patient did share that he is unsure that if he is alone, he does not know what he could do to himself. The patient is not presenting with any psychotic or paranoid behaviors. During an encounter with the patient, he could answer questions appropriately.  HPI: Per Dr. Corky Downs, Edward Shelton is a 21 y.o. male with a history of substance abuse, suicidality who presents with complaints of suicidal ideation.  Patient reports he is continue to feel suicidal since being admitted in March of this year.  He reports self cutting, primarily to the left arm,   Past Psychiatric History:   Risk to Self:   Risk to Others:   Prior Inpatient Therapy:   Prior Outpatient Therapy:    Past Medical History: No past medical history on file. No past surgical history on file. Family History:  Family History  Problem Relation Age of Onset   Healthy Mother    Healthy Father    Family Psychiatric  History:  Social History:  Social History   Substance and Sexual Activity  Alcohol Use Yes     Social History   Substance and Sexual Activity  Drug Use Yes   Types: Marijuana   Comment: shrooms, MDMA, ketamine    Social History   Socioeconomic History   Marital status: Single    Spouse name: Not on file   Number of children: Not on file   Years of education: Not on file   Highest education level: Not on file  Occupational History  Not on file  Tobacco Use   Smoking status: Never   Smokeless tobacco: Never  Vaping Use   Vaping Use: Some days  Substance and Sexual Activity   Alcohol use: Yes   Drug use: Yes    Types: Marijuana    Comment: shrooms, MDMA, ketamine   Sexual activity: Yes  Other Topics Concern   Not on file  Social History Narrative   Not on file   Social Determinants of Health   Financial Resource Strain: Not on file  Food Insecurity: Not on file  Transportation Needs: Not on  file  Physical Activity: Not on file  Stress: Not on file  Social Connections: Not on file   Additional Social History:    Allergies:  No Known Allergies  Labs:  Results for orders placed or performed during the hospital encounter of 08/31/21 (from the past 48 hour(s))  Comprehensive metabolic panel     Status: Abnormal   Collection Time: 08/31/21  7:35 PM  Result Value Ref Range   Sodium 140 135 - 145 mmol/L   Potassium 3.6 3.5 - 5.1 mmol/L   Chloride 106 98 - 111 mmol/L   CO2 26 22 - 32 mmol/L   Glucose, Bld 91 70 - 99 mg/dL    Comment: Glucose reference range applies only to samples taken after fasting for at least 8 hours.   BUN 14 6 - 20 mg/dL   Creatinine, Ser 0.81 0.61 - 1.24 mg/dL   Calcium 9.7 8.9 - 10.3 mg/dL   Total Protein 7.8 6.5 - 8.1 g/dL   Albumin 5.0 3.5 - 5.0 g/dL   AST 19 15 - 41 U/L   ALT 15 0 - 44 U/L   Alkaline Phosphatase 78 38 - 126 U/L   Total Bilirubin 1.3 (H) 0.3 - 1.2 mg/dL   GFR, Estimated >60 >60 mL/min    Comment: (NOTE) Calculated using the CKD-EPI Creatinine Equation (2021)    Anion gap 8 5 - 15    Comment: Performed at [facility], [address] [place], [place] [address]  Ethanol     Status: None   Collection Time: 08/31/21  7:35 PM  Result Value Ref Range   Alcohol, Ethyl (B) <10 <10 mg/dL    Comment: (NOTE) Lowest detectable limit for serum alcohol is 10 mg/dL.  For medical purposes only. Performed at [facility], [address] [place], [place] [id number]   Salicylate level     Status: Abnormal   Collection Time: 08/31/21  7:35 PM  Result Value Ref Range   Salicylate Lvl Q000111Q (L) 7.0 - 30.0 mg/dL    Comment: Performed at [facility], [place], [place] [address]  Acetaminophen level     Status: Abnormal   Collection Time: 08/31/21  7:35 PM  Result Value Ref Range   Acetaminophen (Tylenol), Serum <10 (L) 10 - 30 ug/mL    Comment: (NOTE) Therapeutic concentrations vary  significantly. A range of 10-30 ug/mL  may be an effective concentration for many patients. However, some  are best treated at concentrations outside of this range. Acetaminophen concentrations >150 ug/mL at 4 hours after ingestion  and >50 ug/mL at 12 hours after ingestion are often associated with  toxic reactions.  Performed at [facility], [address] [place],  [address]   cbc     Status: None   Collection Time: 08/31/21  7:35 PM  Result Value Ref Range   WBC 9.2 4.0 -  10.5 K/uL    Comment: REPEATED TO VERIFY   RBC 5.48 4.22 - 5.81 MIL/uL   Hemoglobin 16.2 13.0 - 17.0 g/dL   HCT 46.8 39.0 - 52.0 %   MCV 85.4 80.0 - 100.0 fL   MCH 29.6 26.0 - 34.0 pg   MCHC 34.6 30.0 - 36.0 g/dL   RDW 13.2 11.5 - 15.5 %   Platelets 222 150 - 400 K/uL   nRBC 0.0 0.0 - 0.2 %    Comment: Performed at [facility], [address] [place], [place] [address]  Resp Panel by RT-PCR (Flu A&B, Covid) Anterior Nasal Swab     Status: None   Collection Time: 08/31/21  7:35 PM   Specimen: Anterior Nasal Swab  Result Value Ref Range   SARS Coronavirus 2 by RT PCR NEGATIVE NEGATIVE    Comment: (NOTE) SARS-CoV-2 target nucleic acids are NOT DETECTED.  The SARS-CoV-2 RNA is generally detectable in upper respiratory specimens during the acute phase of infection. The lowest concentration of SARS-CoV-2 viral copies this assay Shelton detect is 138 copies/mL. A negative result does not preclude SARS-Cov-2 infection and should not be used as the sole basis for treatment or other patient management decisions. A negative result may occur with  improper specimen collection/handling, submission of specimen other than nasopharyngeal swab, presence of viral mutation(s) within the areas targeted by this assay, and inadequate number of viral copies(<138 copies/mL). A negative result must be combined with clinical observations, patient history, and epidemiological information. The expected  result is Negative.  Fact Sheet for Patients:  EntrepreneurPulse.com.au  Fact Sheet for Healthcare Providers:  IncredibleEmployment.be  This test is no t yet approved or cleared by the [place] FDA and  has been authorized for detection and/or diagnosis of SARS-CoV-2 by FDA under an Emergency Use Authorization (EUA). This EUA will remain  in effect (meaning this test Shelton be used) for the duration of the COVID-19 declaration under Section 564(b)(1) of the Act, 21 U.S.C.section 360bbb-3(b)(1), unless the authorization is terminated  or revoked sooner.       Influenza A by PCR NEGATIVE NEGATIVE   Influenza B by PCR NEGATIVE NEGATIVE    Comment: (NOTE) The Xpert Xpress SARS-CoV-2/FLU/RSV plus assay is intended as an aid in the diagnosis of influenza from Nasopharyngeal swab specimens and should not be used as a sole basis for treatment. Nasal washings and aspirates are unacceptable for Xpert Xpress SARS-CoV-2/FLU/RSV testing.  Fact Sheet for Patients: EntrepreneurPulse.com.au  Fact Sheet for Healthcare Providers: IncredibleEmployment.be  This test is not yet approved or cleared by the [place] FDA and has been authorized for detection and/or diagnosis of SARS-CoV-2 by FDA under an Emergency Use Authorization (EUA). This EUA will remain in effect (meaning this test Shelton be used) for the duration of the COVID-19 declaration under Section 564(b)(1) of the Act, 21 U.S.C. section 360bbb-3(b)(1), unless the authorization is terminated or revoked.  Performed at [facility], [address] [place], [place] [address]   Urine Drug Screen, Qualitative     Status: Abnormal   Collection Time: 08/31/21  7:36 PM  Result Value Ref Range   Tricyclic, Ur Screen NONE DETECTED NONE DETECTED   Amphetamines, Ur Screen NONE DETECTED NONE DETECTED   MDMA (Ecstasy)Ur Screen NONE DETECTED NONE DETECTED    Cocaine Metabolite,Ur [place] NONE DETECTED NONE DETECTED   Opiate, Ur Screen NONE DETECTED NONE DETECTED   Phencyclidine (PCP) Ur S NONE DETECTED NONE DETECTED   Cannabinoid 50 Ng, Ur  [place] POSITIVE (A) NONE DETECTED   Barbiturates, Ur Screen NONE DETECTED NONE DETECTED   Benzodiazepine, Ur Scrn NONE DETECTED NONE DETECTED   Methadone Scn, Ur NONE DETECTED NONE DETECTED    Comment: (NOTE) Tricyclics + metabolites, urine    Cutoff 1000 ng/mL Amphetamines + metabolites, urine  Cutoff 1000 ng/mL MDMA (Ecstasy), urine              Cutoff 500 ng/mL Cocaine Metabolite, urine          Cutoff 300 ng/mL Opiate + metabolites, urine        Cutoff 300 ng/mL Phencyclidine (PCP), urine         Cutoff 25 ng/mL Cannabinoid, urine                 Cutoff 50 ng/mL Barbiturates + metabolites, urine  Cutoff 200 ng/mL Benzodiazepine, urine              Cutoff 200 ng/mL Methadone, urine                   Cutoff 300 ng/mL  The urine drug screen provides only a preliminary, unconfirmed analytical test result and should not be used for non-medical purposes. Clinical consideration and professional judgment should be applied to any positive drug screen result due to possible interfering substances. A more specific alternate chemical method must be used in order to obtain a confirmed analytical result. Gas chromatography / mass spectrometry (GC/MS) is the preferred confirm atory method. Performed at [facility], [address] [place], [place] [address]     No current facility-administered medications for this encounter.   No current outpatient medications on file.    Musculoskeletal: Strength & Muscle Tone: within normal limits Gait & Station: normal Patient leans: N/A  Psychiatric Specialty Exam:  Presentation  General Appearance: Appropriate for Environment  Eye Contact:Good  Speech:Clear and Coherent  Speech Volume:Normal  Handedness:Right   Mood and Affect   Mood:Depressed  Affect:Appropriate   Thought Process  Thought Processes:Coherent  Descriptions of Associations:Intact  Orientation:Full (Time, Place and Person)  Thought Content:Logical  History of Schizophrenia/Schizoaffective disorder:No  Duration of Psychotic Symptoms:No data recorded Hallucinations:Hallucinations: None  Ideas of Reference:None  Suicidal Thoughts:Suicidal Thoughts: Yes, Passive SI Passive Intent and/or Plan: Without Intent; Without Plan; With Means to [place]; With Access to Means  Homicidal Thoughts:Homicidal Thoughts: No   Sensorium  Memory:Immediate Good; Recent Good; Remote Good  Judgment:Poor  Insight:Poor   Executive Functions  Concentration:Fair  Attention Span:Fair  [place]   Psychomotor Activity  Psychomotor Activity:Psychomotor Activity: Normal   Assets  Assets:Communication Skills; Desire for Improvement; Resilience; Social Support   Sleep  Sleep:Sleep: Good Number of Hours of Sleep: 8   Physical Exam: Physical Exam Vitals and nursing note reviewed.  Constitutional:      Appearance: He is normal weight.  HENT:     Head: Normocephalic and atraumatic.     Right Ear: External ear normal.     Left Ear: External ear normal.     Nose: Nose normal.     Mouth/Throat:     Mouth: Mucous membranes are moist.  Cardiovascular:     Rate and Rhythm: Normal rate.     Pulses: Normal pulses.  Pulmonary:     Effort: Pulmonary effort is normal.  Musculoskeletal:        General: Normal range of motion.     Cervical back: Normal range of motion and neck supple.  Neurological:  General: No focal deficit present.     Mental Status: He is alert and oriented to person, place, and time.  Psychiatric:        Attention and Perception: Attention and perception normal.        Mood and Affect: Mood is anxious and depressed. Affect is blunt.        Speech: Speech normal.         Behavior: Behavior is withdrawn. Behavior is cooperative.        Thought Content: Thought content includes suicidal ideation.        Cognition and Memory: Cognition and memory normal.        Judgment: Judgment is impulsive and inappropriate.   Review of Systems  Psychiatric/Behavioral:  Positive for depression, substance abuse and suicidal ideas. The patient is nervous/anxious.   All other systems reviewed and are negative. Blood pressure (!) 144/86, pulse 74, temperature 97.7 F (36.5 C), temperature source Oral, resp. rate 18, height 5\' 3"  (1.6 m), weight 54.4 kg, SpO2 98 %. Body mass index is 21.26 kg/m.  Treatment Plan Summary: Medication management and Plan The patient is a safety risk to himself, others and requires psychiatric inpatient admission for stabilization and treatment.  Disposition: Recommend psychiatric Inpatient admission when medically cleared. Supportive therapy provided about ongoing stressors.  Caroline Sauger, NP 09/01/2021 12:49 AM

## 2021-09-01 NOTE — ED Notes (Signed)
Spoke with pt's pretrial supervisor Court Joy @ [phone].  Mr Edward Shelton requested that pt call upon arrival at [facility] to preform "reboot" on ankle monitor.  Pt informed

## 2021-09-01 NOTE — ED Notes (Signed)
Called shift lead RN Bess Harvest @ [phone] with no answer

## 2021-09-01 NOTE — ED Notes (Signed)
Attempted to call report to  [facility].

## 2021-09-01 NOTE — BH Assessment (Addendum)
Referral information for Psychiatric Hospitalization faxed to:  [facility] ([phone]) Per Hassie Bruce, no appropriate beds available at this time.  [facility] [phone]),   [facility] [phone]),    [facility] [phone], [phone], [phone] or [phone]),    [facility] [phone]),   [facility] [phone] -or- [phone]),    [facility] [phone])    [facility] [phone]).   [facility] [phone])

## 2021-09-01 NOTE — ED Notes (Signed)
Called shift lead RN Mani @ [phone] with no answer 

## 2021-09-01 NOTE — ED Notes (Signed)
Report received from Kimberly S , RN including SBAR. On initial round after report Pt is warm/dry, resting quietly in room without any s/s of distress.  Will continue to monitor throughout shift as ordered for any changes in behaviors and for continued safety.   

## 2021-09-16 ENCOUNTER — Encounter: Payer: Self-pay | Admitting: Emergency Medicine

## 2021-09-16 ENCOUNTER — Emergency Department
Admission: EM | Admit: 2021-09-16 | Discharge: 2021-09-17 | Disposition: A | Discharge status: Home / Self Care | Payer: Medicaid Other | Attending: Emergency Medicine | Admitting: Emergency Medicine

## 2021-09-16 DIAGNOSIS — Z20822 Contact with and (suspected) exposure to covid-19: Secondary | ICD-10-CM | POA: Diagnosis not present

## 2021-09-16 DIAGNOSIS — F1994 Other psychoactive substance use, unspecified with psychoactive substance-induced mood disorder: Secondary | ICD-10-CM | POA: Diagnosis present

## 2021-09-16 DIAGNOSIS — E876 Hypokalemia: Secondary | ICD-10-CM | POA: Insufficient documentation

## 2021-09-16 DIAGNOSIS — R45851 Suicidal ideations: Secondary | ICD-10-CM | POA: Insufficient documentation

## 2021-09-16 DIAGNOSIS — F1910 Other psychoactive substance abuse, uncomplicated: Secondary | ICD-10-CM | POA: Diagnosis present

## 2021-09-16 DIAGNOSIS — D72829 Elevated white blood cell count, unspecified: Secondary | ICD-10-CM | POA: Diagnosis not present

## 2021-09-16 DIAGNOSIS — F32A Depression, unspecified: Secondary | ICD-10-CM

## 2021-09-16 LAB — COMPREHENSIVE METABOLIC PANEL
ALT: 16 U/L (ref 0–44)
AST: 17 U/L (ref 15–41)
Albumin: 4.7 g/dL (ref 3.5–5.0)
Alkaline Phosphatase: 93 U/L (ref 38–126)
Anion gap: 11 (ref 5–15)
BUN: 14 mg/dL (ref 6–20)
CO2: 23 mmol/L (ref 22–32)
Calcium: 9.2 mg/dL (ref 8.9–10.3)
Chloride: 104 mmol/L (ref 98–111)
Creatinine, Ser: 0.92 mg/dL (ref 0.61–1.24)
GFR, Estimated: >60 mL/min (ref >60)
Glucose, Bld: 145 mg/dL — ABNORMAL HIGH (ref 70–99)
Potassium: 3.4 mmol/L — ABNORMAL LOW (ref 3.5–5.1)
Sodium: 138 mmol/L (ref 135–145)
Total Bilirubin: 1 mg/dL (ref 0.3–1.2)
Total Protein: 7.9 g/dL (ref 6.5–8.1)

## 2021-09-16 LAB — SALICYLATE LEVEL: Salicylate Lvl: <7 mg/dL — ABNORMAL LOW (ref 7.0–30.0)

## 2021-09-16 LAB — CBC
HCT: 45.2 % (ref 39.0–52.0)
Hemoglobin: 16 g/dL (ref 13.0–17.0)
MCH: 29.6 pg (ref 26.0–34.0)
MCHC: 35.4 g/dL (ref 30.0–36.0)
MCV: 83.7 fL (ref 80.0–100.0)
Platelets: 209 10*3/uL (ref 150–400)
RBC: 5.4 MIL/uL (ref 4.22–5.81)
RDW: 12.8 % (ref 11.5–15.5)
WBC: 11 10*3/uL — ABNORMAL HIGH (ref 4.0–10.5)
nRBC: 0 % (ref 0.0–0.2)

## 2021-09-16 LAB — ACETAMINOPHEN LEVEL: Acetaminophen (Tylenol), Serum: <10 ug/mL — ABNORMAL LOW (ref 10–30)

## 2021-09-16 LAB — RESP PANEL BY RT-PCR (FLU A&B, COVID) ARPGX2
Influenza A by PCR: NEGATIVE
Influenza B by PCR: NEGATIVE
SARS Coronavirus 2 by RT PCR: NEGATIVE

## 2021-09-16 LAB — ETHANOL: Alcohol, Ethyl (B): <10 mg/dL (ref <10)

## 2021-09-16 MED ORDER — ZIPRASIDONE MESYLATE 20 MG IM SOLR
20.0000 mg | Freq: Once | INTRAMUSCULAR | Status: AC
  Administered 2021-09-16: 20 mg via INTRAMUSCULAR
  Filled 2021-09-16: qty 20

## 2021-09-16 NOTE — ED Notes (Addendum)
Pt dressed out with this rn and tech Paige, into burgundy scrubs. Pt belongings placed in bag 1 Black shirt 1 Black sweatshirt 1 Black pants 1 pair of Gray shorts Black/white striped underwear 1 gray sock, 1 black sock Pair of black shoes 1 silver earring 1 Black mask [employer]  Airpods and airpod case 1 Black charger 1 black iphone 1 white charger 1 red lighter

## 2021-09-16 NOTE — ED Provider Notes (Signed)
[facility] Provider Note    Event Date/Time   First MD Initiated Contact with Patient 09/16/21 2158     (approximate)   History   Suicidal   HPI  Edward Shelton is a 21 y.o. male past medical history of polysubstance use disorder who presents with anxiety.  Patient is here voluntarily.  He told police that he wanted to kill himself.  Patient was recently here for suicidal ideation and went to old [place].  Currently staying at a house with several other people.  Patient is quite anxious and having difficulty talking to me on arrival.  He cannot really vocalize what is going on but he is hyperventilating and looks anxious having difficulty sitting still.    History reviewed. No pertinent past medical history.  Patient Active Problem List   Diagnosis Date Noted   Substance induced mood disorder (HCC) 06/04/2021   Polysubstance abuse (HCC) 06/03/2021     Physical Exam  Triage Vital Signs: ED Triage Vitals  Enc Vitals Group     BP      Pulse      Resp      Temp      Temp src      SpO2      Weight      Height      Head Circumference      Peak Flow      Pain Score      Pain Loc      Pain Edu?      Excl. in GC?     Most recent vital signs: Vitals:   09/16/21 2223  BP: 132/89  Pulse: (!) 120  Resp: (!) 22  Temp: 97.7 F (36.5 C)  SpO2: 99%     General: Awake, anxious appearing moving around on the bed CV:  Good peripheral perfusion.  Resp:  Normal effort.  Abd:  No distention.  Neuro:             Awake, Alert, Oriented x 3  Other:  Patient is anxious he is wearing a facemask to help him not hyperventilate the lays down in bed and then sits up and lays down again, difficulty vocalizing what he is feeling   ED Results / Procedures / Treatments  Labs (all labs ordered are listed, but only abnormal results are displayed) Labs Reviewed  COMPREHENSIVE METABOLIC PANEL - Abnormal; Notable for the following components:       Result Value   Potassium 3.4 (*)    Glucose, Bld 145 (*)    All other components within normal limits  SALICYLATE LEVEL - Abnormal; Notable for the following components:   Salicylate Lvl <7.0 (*)    All other components within normal limits  ACETAMINOPHEN LEVEL - Abnormal; Notable for the following components:   Acetaminophen (Tylenol), Serum <10 (*)    All other components within normal limits  CBC - Abnormal; Notable for the following components:   WBC 11.0 (*)    All other components within normal limits  RESP PANEL BY RT-PCR (FLU A&B, COVID) ARPGX2  ETHANOL  URINE DRUG SCREEN, QUALITATIVE ([facility] ONLY)     EKG     RADIOLOGY    PROCEDURES:  Critical Care performed: No  Procedures    MEDICATIONS ORDERED IN ED: Medications  ziprasidone (GEODON) injection 20 mg (20 mg Intramuscular Given 09/16/21 2214)     IMPRESSION / MDM / ASSESSMENT AND PLAN / ED COURSE  I reviewed the triage vital  signs and the nursing notes.                              Patient's presentation is most consistent with acute presentation with potential threat to life or bodily function.  Differential diagnosis includes, but is not limited to, anxiety attack, substance-induced mood disorder  Patient is a 21 year old male who presents with anxiety and suicidal ideation.  Patient quite anxious appearing on arrival he is having difficulty sitting still having trouble verbalizing and is hyperventilating.  Apparently he told PD that he was suicidal.  I see that he was just here and sent to [facility] [place] for similar.  Patient given IM Geodon and this helped calm him.  He was seen by psychiatry and they are recommending observation in the a.m.  Suspect substance-induced.  Labs are reassuring including negative Tylenol salicylate level has a mild leukocytosis mildly hypokalemic.  We will continue to monitor.  The patient has been placed in psychiatric observation due to the need to provide a safe environment  for the patient while obtaining psychiatric consultation and evaluation, as well as ongoing medical and medication management to treat the patient's condition.  The patient has not been placed under full IVC at this time.   FINAL CLINICAL IMPRESSION(S) / ED DIAGNOSES   Final diagnoses:  Suicidal ideation     Rx / DC Orders   ED Discharge Orders     None        Note:  This document was prepared using Dragon voice recognition software and may include unintentional dictation errors.   Georga Hacking, MD 09/16/21 2322

## 2021-09-17 DIAGNOSIS — F1994 Other psychoactive substance use, unspecified with psychoactive substance-induced mood disorder: Secondary | ICD-10-CM | POA: Diagnosis not present

## 2021-09-17 LAB — URINE DRUG SCREEN, QUALITATIVE (ARMC ONLY)
Amphetamines, Ur Screen: NOT DETECTED
Barbiturates, Ur Screen: NOT DETECTED
Benzodiazepine, Ur Scrn: NOT DETECTED
Cannabinoid 50 Ng, Ur ~~LOC~~: POSITIVE — AB
Cocaine Metabolite,Ur ~~LOC~~: NOT DETECTED
MDMA (Ecstasy)Ur Screen: NOT DETECTED
Methadone Scn, Ur: NOT DETECTED
Opiate, Ur Screen: NOT DETECTED
Phencyclidine (PCP) Ur S: NOT DETECTED
Tricyclic, Ur Screen: NOT DETECTED

## 2021-09-17 NOTE — ED Provider Notes (Signed)
Emergency Medicine Observation Re-evaluation Note  Edward Shelton is a 21 y.o. male, seen on rounds today.  Pt initially presented to the ED for complaints of Suicidal Currently, the patient is sleeping.  Physical Exam  BP 132/89 (BP Location: Right Arm)   Pulse (!) 120   Temp 97.7 F (36.5 C) (Oral)   Resp (!) 22   SpO2 99%  Physical Exam Gen: No acute distress  Resp: Normal rise and fall of chest Neuro: Moving all four extremities Psych: Resting currently, calm and cooperative when awake    ED Course / MDM  EKG:   I have reviewed the labs performed to date as well as medications administered while in observation.  Recent changes in the last 24 hours include no acute events overnight.  Plan  Current plan is for psychiatric reassessment in the morning for further disposition.  Edward Shelton is not under involuntary commitment.     Edward Shelton, Edward Maw, DO 09/17/21 [phone]

## 2021-09-17 NOTE — Consult Note (Signed)
[facility] Psych ED Progress Note  09/17/2021 9:06 AM Edward Shelton  MRN:  [id number]   Method of visit?: Face to Face   Subjective:  "I smoked weed and I do not know why I called the police. The weed caused it."   On re-assessment, patient is calm and cooperative. He slept well with no behavioral problems through the night. He reports that he is taking some medication from discharge from [facility] and been doing well. He reports having a job. He states that he did smoke weed last night and he started feeling really "weird and paranoid." He presents with some insight, stating that he needs to "quit the weed." However, he has failed to follow through with outpatient providers at all.  Patient denies suicidal or homicidal thoughts, paranoia, auditory or visual hallucinations. He does not appear to be responding to internal stimuli. Patient requesting discharge.   Principal Problem: Substance induced mood disorder (HCC) Diagnosis:  Principal Problem:   Substance induced mood disorder (HCC) Active Problems:   Polysubstance abuse (HCC)  Total Time spent with patient: 15 minutes  Past Psychiatric History: see previous  Past Medical History: History reviewed. No pertinent past medical history. History reviewed. No pertinent surgical history. Family History:  Family History  Problem Relation Age of Onset   Healthy Mother    Healthy Father    Family Psychiatric  History: see previous Social History:  Social History   Substance and Sexual Activity  Alcohol Use Yes     Social History   Substance and Sexual Activity  Drug Use Yes   Types: Marijuana, IV   Comment: shrooms, MDMA, ketamine    Social History   Socioeconomic History   Marital status: Single    Spouse name: Not on file   Number of children: Not on file   Years of education: Not on file   Highest education level: Not on file  Occupational History   Not on file  Tobacco Use   Smoking status: Every Day    Types:  Cigarettes   Smokeless tobacco: Current  Vaping Use   Vaping Use: Every day  Substance and Sexual Activity   Alcohol use: Yes   Drug use: Yes    Types: Marijuana, IV    Comment: shrooms, MDMA, ketamine   Sexual activity: Yes  Other Topics Concern   Not on file  Social History Narrative   Not on file   Social Determinants of Health   Financial Resource Strain: Not on file  Food Insecurity: Not on file  Transportation Needs: Not on file  Physical Activity: Not on file  Stress: Not on file  Social Connections: Not on file    Sleep: Good  Appetite:  Good  Current Medications: No current facility-administered medications for this encounter.   No current outpatient medications on file.    Lab Results:  Results for orders placed or performed during the hospital encounter of 09/16/21 (from the past 48 hour(s))  Comprehensive metabolic panel     Status: Abnormal   Collection Time: 09/16/21 10:38 PM  Result Value Ref Range   Sodium 138 135 - 145 mmol/L   Potassium 3.4 (L) 3.5 - 5.1 mmol/L   Chloride 104 98 - 111 mmol/L   CO2 23 22 - 32 mmol/L   Glucose, Bld 145 (H) 70 - 99 mg/dL    Comment: Glucose reference range applies only to samples taken after fasting for at least 8 hours.   BUN 14 6 - 20  mg/dL   Creatinine, Ser 1.47 0.61 - 1.24 mg/dL   Calcium 9.2 8.9 - 82.9 mg/dL   Total Protein 7.9 6.5 - 8.1 g/dL   Albumin 4.7 3.5 - 5.0 g/dL   AST 17 15 - 41 U/L   ALT 16 0 - 44 U/L   Alkaline Phosphatase 93 38 - 126 U/L   Total Bilirubin 1.0 0.3 - 1.2 mg/dL   GFR, Estimated >56 >21 mL/min    Comment: (NOTE) Calculated using the CKD-EPI Creatinine Equation (2021)    Anion gap 11 5 - 15    Comment: Performed at [facility], [address], [place], [place] [address]  Ethanol     Status: None   Collection Time: 09/16/21 10:38 PM  Result Value Ref Range   Alcohol, Ethyl (B) <10 <10 mg/dL    Comment: (NOTE) Lowest detectable limit for serum alcohol is 10  mg/dL.  For medical purposes only. Performed at [facility], [address] [place], [place] [address]   Salicylate level     Status: Abnormal   Collection Time: 09/16/21 10:38 PM  Result Value Ref Range   Salicylate Lvl <7.0 (L) 7.0 - 30.0 mg/dL    Comment: Performed at [facility], [address] [place], [place] [address]  Acetaminophen level     Status: Abnormal   Collection Time: 09/16/21 10:38 PM  Result Value Ref Range   Acetaminophen (Tylenol), Serum <10 (L) 10 - 30 ug/mL    Comment: (NOTE) Therapeutic concentrations vary significantly. A range of 10-30 ug/mL  may be an effective concentration for many patients. However, some  are best treated at concentrations outside of this range. Acetaminophen concentrations >150 ug/mL at 4 hours after ingestion  and >50 ug/mL at 12 hours after ingestion are often associated with  toxic reactions.  Performed at [facility], [address] [place], [place] [address]   cbc     Status: Abnormal   Collection Time: 09/16/21 10:38 PM  Result Value Ref Range   WBC 11.0 (H) 4.0 - 10.5 K/uL   RBC 5.40 4.22 - 5.81 MIL/uL   Hemoglobin 16.0 13.0 - 17.0 g/dL   HCT 44.0 10.2 - 72.5 %   MCV 83.7 80.0 - 100.0 fL   MCH 29.6 26.0 - 34.0 pg   MCHC 35.4 30.0 - 36.0 g/dL   RDW 36.6 44.0 - 34.7 %   Platelets 209 150 - 400 K/uL   nRBC 0.0 0.0 - 0.2 %    Comment: Performed at [facility], [address] [place], [place] [address]  Resp Panel by RT-PCR (Flu A&B, Covid) Anterior Nasal Swab     Status: None   Collection Time: 09/16/21 10:39 PM   Specimen: Anterior Nasal Swab  Result Value Ref Range   SARS Coronavirus 2 by RT PCR NEGATIVE NEGATIVE    Comment: (NOTE) SARS-CoV-2 target nucleic acids are NOT DETECTED.  The SARS-CoV-2 RNA is generally detectable in upper respiratory specimens during the acute phase of infection. The lowest concentration of SARS-CoV-2 viral copies this assay can detect  is 138 copies/mL. A negative result does not preclude SARS-Cov-2 infection and should not be used as the sole basis for treatment or other patient management decisions. A negative result may occur with  improper specimen collection/handling, submission of specimen other than nasopharyngeal swab, presence of viral mutation(s) within the areas targeted by this assay, and inadequate number of viral copies(<138 copies/mL). A negative result must be combined with clinical  observations, patient history, and epidemiological information. The expected result is Negative.  Fact Sheet for Patients:  BloggerCourse.com  Fact Sheet for Healthcare Providers:  SeriousBroker.it  This test is no t yet approved or cleared by the [place] FDA and  has been authorized for detection and/or diagnosis of SARS-CoV-2 by FDA under an Emergency Use Authorization (EUA). This EUA will remain  in effect (meaning this test can be used) for the duration of the COVID-19 declaration under Section 564(b)(1) of the Act, 21 U.S.C.section 360bbb-3(b)(1), unless the authorization is terminated  or revoked sooner.       Influenza A by PCR NEGATIVE NEGATIVE   Influenza B by PCR NEGATIVE NEGATIVE    Comment: (NOTE) The Xpert Xpress SARS-CoV-2/FLU/RSV plus assay is intended as an aid in the diagnosis of influenza from Nasopharyngeal swab specimens and should not be used as a sole basis for treatment. Nasal washings and aspirates are unacceptable for Xpert Xpress SARS-CoV-2/FLU/RSV testing.  Fact Sheet for Patients: BloggerCourse.com  Fact Sheet for Healthcare Providers: SeriousBroker.it  This test is not yet approved or cleared by the [place] FDA and has been authorized for detection and/or diagnosis of SARS-CoV-2 by FDA under an Emergency Use Authorization (EUA). This EUA will remain in effect (meaning this  test can be used) for the duration of the COVID-19 declaration under Section 564(b)(1) of the Act, 21 U.S.C. section 360bbb-3(b)(1), unless the authorization is terminated or revoked.  Performed at [facility], [address] [place], [place] [address]     Blood Alcohol level:  Lab Results  Component Value Date   [facility] <10 09/16/2021   ETH <10 08/31/2021    Physical Findings: AIMS:  , ,  ,  ,    CIWA:    COWS:     Musculoskeletal: Strength & Muscle Tone: within normal limits Gait & Station: normal Patient leans: N/A  Psychiatric Specialty Exam:  Presentation  General Appearance: Appropriate for Environment  Eye Contact:Good  Speech:Clear and Coherent  Speech Volume:Decreased  Handedness:Right   Mood and Affect  Mood:Euthymic  Affect:Appropriate   Thought Process  Thought Processes:Coherent  Descriptions of Associations:Intact  Orientation:Full (Time, Place and Person)  Thought Content:WDL  History of Schizophrenia/Schizoaffective disorder:No  Duration of Psychotic Symptoms:No data recorded Hallucinations:Hallucinations: None  Ideas of Reference:None  Suicidal Thoughts:Suicidal Thoughts: No SI Active Intent and/or Plan: Without Intent; Without Means to Carry Out; With Access to Means; Without Access to Means  Homicidal Thoughts:Homicidal Thoughts: No   Sensorium  Memory:Immediate Good  Judgment:Poor  Insight:Fair   Executive Functions  Concentration:Good  Attention Span:Good  Recall:Good  Fund of Knowledge:Fair  Language:Fair   Psychomotor Activity  Psychomotor Activity:Psychomotor Activity: Normal   Assets  Assets:Housing; Financial Resources/Insurance; Desire for Improvement; Resilience; Physical Health   Sleep  Sleep:Sleep: Good Number of Hours of Sleep: 6    Physical Exam: Physical Exam Vitals and nursing note reviewed.  HENT:     Head: Normocephalic.     Nose: No congestion or rhinorrhea.  Eyes:      General:        Right eye: No discharge.        Left eye: No discharge.  Pulmonary:     Effort: Pulmonary effort is normal.  Musculoskeletal:        General: Normal range of motion.     Cervical back: Normal range of motion.  Skin:    General: Skin is dry.  Neurological:     Mental Status: He is alert  and oriented to person, place, and time.  Psychiatric:        Attention and Perception: Attention normal.        Mood and Affect: Mood normal.        Speech: Speech normal.        Behavior: Behavior normal.        Thought Content: Thought content normal.        Cognition and Memory: Cognition normal.        Judgment: Judgment normal.    Review of Systems  Psychiatric/Behavioral:  Positive for substance abuse.   All other systems reviewed and are negative.  Blood pressure 93/71, pulse 98, temperature 98.3 F (36.8 C), temperature source Oral, resp. rate 16, SpO2 96 %. There is no height or weight on file to calculate BMI.  Treatment Plan Summary: Plan Patient is encouraged to follow up with outpatient mental health/substance use providers. He states that he doe shave an appointment "somewhere" in a few days, set up by [facility] when he was discharged. He states the paperwork is at home. Discussed importance of follow up at length. Patient agreed. Also can follow up with [facility]. Reviewed with EDP.    Vanetta Mulders, NP 09/17/2021, 9:06 AM

## 2021-09-17 NOTE — BH Assessment (Signed)
Comprehensive Clinical Assessment (CCA) Note  09/17/2021 Edward Shelton [id number]  Chief Complaint: Patient is a 21 year old male presenting to [facility] ED. Per police officer that brought the patient in he reports that the patient called the police and told the police that he had a knife, when police arrived the patient had barricaded himself in the bathroom. Police also reports that patient had "satanic and suicidal writing on the wall." Patient was just recently admitted to [facility] on 09/01/21 and has not been taking his medications. Per history patient uses multiple illicit substances. During assessment patient appeared to be under the influence of some kind of substance, patient was disorganized, unable to sit still and anxious.  Per Psyc NP Elenore Paddy patient to be reassessed Chief Complaint  Patient presents with   Suicidal   Visit Diagnosis: Polysubstance abuse, Substance-induced mood disorder    CCA Screening, Triage and Referral (STR)  Patient Reported Information How did you hear about [place]? Legal System  Referral name: No data recorded Referral phone number: No data recorded  Whom do you see for routine medical problems? No data recorded Practice/Facility Name: No data recorded Practice/Facility Phone Number: No data recorded Name of Contact: No data recorded Contact Number: No data recorded Contact Fax Number: No data recorded Prescriber Name: No data recorded Prescriber Address (if known): No data recorded  What Is the Reason for Your Visit/Call Today? Patient presents via police under IVC  How Long Has This Been Causing You Problems? > than 6 months  What Do You Feel Would Help You the Most Today? Treatment for Depression or other mood problem; Medication(s)   Have You Recently Been in Any Inpatient Treatment (Hospital/Detox/Crisis Center/28-Day Program)? No data recorded Name/Location of Program/Hospital:No data recorded How Long Were You There? No  data recorded When Were You Discharged? No data recorded  Have You Ever Received Services From [facility] Before? No data recorded Who Do You See at [facility]? No data recorded  Have You Recently Had Any Thoughts About Hurting Yourself? -- (UTA)  Are You Planning to Commit Suicide/Harm Yourself At This time? -- (UTA)   Have you Recently Had Thoughts About Hurting Someone Else? -- (UTA)  Explanation: No data recorded  Have You Used Any Alcohol or Drugs in the Past 24 Hours? -- (UTA)  How Long Ago Did You Use Drugs or Alcohol? No data recorded What Did You Use and How Much? Cannabis   Do You Currently Have a Therapist/Psychiatrist? -- (UTA)  Name of Therapist/Psychiatrist: No data recorded  Have You Been Recently Discharged From Any Office Practice or Programs? -- (UTA)  Explanation of Discharge From Practice/Program: No data recorded    CCA Screening Triage Referral Assessment Type of Contact: Face-to-Face  Is this Initial or Reassessment? No data recorded Date Telepsych consult ordered in [facility]:  No data recorded Time Telepsych consult ordered in [facility]:  No data recorded  Patient Reported Information Reviewed? No data recorded Patient Left Without Being Seen? No data recorded Reason for Not Completing Assessment: No data recorded  Collateral Involvement: None provided   Does Patient Have a Court Appointed Legal Guardian? No data recorded Name and Contact of Legal Guardian: No data recorded If Minor and Not Living with Parent(s), Who has Custody? n/a  Is CPS involved or ever been involved? Never  Is APS involved or ever been involved? Never   Patient Determined To Be At Risk for Harm To Self or Others Based on Review of Patient  Reported Information or Presenting Complaint? No  Method: No data recorded Availability of Means: No data recorded Intent: No data recorded Notification Required: No data recorded Additional Information for Danger to Others Potential:  No data recorded Additional Comments for Danger to Others Potential: No data recorded Are There Guns or Other Weapons in Your Home? No data recorded Types of Guns/Weapons: No data recorded Are These Weapons Safely Secured?                            No data recorded Who Could Verify You Are Able To Have These Secured: No data recorded Do You Have any Outstanding Charges, Pending Court Dates, Parole/Probation? No data recorded Contacted To Inform of Risk of Harm To Self or Others: No data recorded  [facility]   Does Patient Present under Involuntary Commitment? No  IVC Papers Initial File Date: No data recorded  [place] of Residence:    Patient Currently Receiving the Following Services: Not Receiving Services   Determination of Need: Emergent (2 hours)   Options For Referral: Inpatient Hospitalization; ED Referral     CCA Biopsychosocial Intake/Chief Complaint:  No data recorded Current Symptoms/Problems: No data recorded  Patient Reported Schizophrenia/Schizoaffective Diagnosis in Past: No   Strengths: Pt has good insight; pt is willing to participate in treatment; pt has stable housing; pt is employed.  Preferences: No data recorded Abilities: No data recorded  Type of Services Patient Feels are Needed: No data recorded  Initial Clinical Notes/Concerns: No data recorded  Mental Health Symptoms Depression:   Tearfulness; Hopelessness; Sleep (too much or little); Change in energy/activity   Duration of Depressive symptoms:  Greater than two weeks   Mania:   N/A   Anxiety:    Difficulty concentrating; Restlessness   Psychosis:   None   Duration of Psychotic symptoms: No data recorded  Trauma:   None   Obsessions:   Cause anxiety; Disrupts routine/functioning; Attempts to suppress/neutralize; Recurrent & persistent thoughts/impulses/images   Compulsions:   Intended to reduce stress or prevent another outcome; "Driven" to  perform behaviors/acts; Repeated behaviors/mental acts; Poor Insight   Inattention:   None   Hyperactivity/Impulsivity:   None   Oppositional/Defiant Behaviors:   None   Emotional Irregularity:   Mood lability; Potentially harmful impulsivity; Recurrent suicidal behaviors/gestures/threats   Other Mood/Personality Symptoms:  No data recorded   Mental Status Exam Appearance and self-care  Stature:   Small   Weight:   Average weight   Clothing:   -- (Scrubs)   Grooming:   Normal   Cosmetic use:   None   Posture/gait:   Normal   Motor activity:   Not Remarkable   Sensorium  Attention:   Normal   Concentration:   Normal   Orientation:   Situation; Place; Person; Object   Recall/memory:   Normal   Affect and Mood  Affect:   Flat   Mood:   Depressed   Relating  Eye contact:   Fleeting   Facial expression:   Responsive   Attitude toward examiner:   Cooperative   Thought and Language  Speech flow:  Clear and Coherent   Thought content:   Appropriate to Mood and Circumstances   Preoccupation:   None   Hallucinations:   None   Organization:  No data recorded  [employer] of Knowledge:   Average   Intelligence:   Average   Abstraction:  Normal   Judgement:   Fair   Reality Testing:   Adequate   Insight:   Uses connections; Flashes of insight   Decision Making:   Impulsive   Social Functioning  Social Maturity:   Impulsive   Social Judgement:   Impropriety   Stress  Stressors:   Other (Comment) (Depression)   Coping Ability:   Exhausted   Skill Deficits:   Decision making   Supports:   Family; Support needed     Religion: Religion/Spirituality Are You A Religious Person?:  (Not assessed) How Might This Affect Treatment?: Not assessed  Leisure/Recreation: Leisure / Recreation Do You Have Hobbies?: Yes Leisure and Hobbies: soccer, music  Exercise/Diet: Exercise/Diet Do You  Exercise?: Yes What Type of Exercise Do You Do?: Run/Walk, Weight Training How Many Times a Week Do You Exercise?: Daily Have You Gained or Lost A Significant Amount of Weight in the Past Six Months?: No Do You Follow a Special Diet?: No Do You Have Any Trouble Sleeping?: No   CCA Employment/Education Employment/Work Situation: Employment / Work Situation Employment Situation: Employed Work Stressors: None Patient's Job has Been Impacted by Current Illness: No Has Patient ever Been in Equities trader?: No  Education: Education Last Grade Completed: 12 Did You Product manager?: No Did You Have An Individualized Education Program (IIEP): No Did You Have Any Difficulty At [employer]?: No   CCA Family/Childhood History Family and Relationship History: Family history Marital status: Single Does patient have children?: No  Childhood History:  Childhood History By whom was/is the patient raised?: Mother/father and step-parent Did patient suffer any verbal/emotional/physical/sexual abuse as a child?: No Did patient suffer from severe childhood neglect?: No Has patient ever been sexually abused/assaulted/raped as an adolescent or adult?: No Was the patient ever a victim of a crime or a disaster?: No Witnessed domestic violence?: No Has patient been affected by domestic violence as an adult?: No  Child/Adolescent Assessment:     CCA Substance Use Alcohol/Drug Use: Alcohol / Drug Use Pain Medications: See MAR Prescriptions: See MAR Over the Counter: See MAR History of alcohol / drug use?: Yes Longest period of sobriety (when/how long): Unknown Negative Consequences of Use: Personal relationships, Legal Withdrawal Symptoms: Tremors                         ASAM's:  Six Dimensions of Multidimensional Assessment  Dimension 1:  Acute Intoxication and/or Withdrawal Potential:   Dimension 1:  Description of individual's past and current experiences of substance use and  withdrawal: Pt has a hx of polysubstance abuse  Dimension 2:  Biomedical Conditions and Complications:   Dimension 2:  Description of patient's biomedical conditions and  complications: 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  Dimension 6:  Recovery/Living Environment:  Dimension 6:  Recovery/Iiving environment criteria description: Pt reported that he lives with friends who use.  ASAM Severity Score: ASAM's Severity Rating Score: 20  ASAM Recommended Level of Treatment: ASAM Recommended Level of Treatment:  Level III Residential Treatment   Substance use Disorder (SUD) Substance Use Disorder (SUD)  Checklist Symptoms of Substance Use: Continued use despite persistent or recurrent social, interpersonal problems, caused or exacerbated by use, Continued use despite having a persistent/recurrent physical/psychological problem caused/exacerbated by use, Evidence of tolerance, Evidence of withdrawal (Comment), Large amounts of time spent to obtain, use or recover from the substance(s), Persistent desire or unsuccessful efforts to cut down or control use  Recommendations for Services/Supports/Treatments:    DSM5 Diagnoses: Patient Active Problem List   Diagnosis Date Noted   Substance induced mood disorder (HCC) 06/04/2021   Polysubstance abuse (HCC) 06/03/2021    Patient Centered Plan: Patient is on the following Treatment Plan(s):  Impulse Control and  Substance Abuse   Referrals to Alternative Service(s): Referred to Alternative Service(s):   Place:   Date:   Time:    Referred to Alternative Service(s):   Place:   Date:   Time:    Referred to Alternative Service(s):   Place:   Date:   Time:    Referred to Alternative Service(s):   Place:   Date:   Time:      @BHCOLLABOFCARE @  , LCAS-A

## 2021-09-17 NOTE — ED Notes (Signed)
Pt standing in doorway of exam room. Asking to use phone. Informed of phone usage policies in place. Verbalized understanding. No distress noted at this time.

## 2021-09-17 NOTE — ED Provider Notes (Signed)
-----------------------------------------   8:48 AM on 09/17/2021 ----------------------------------------- Patient has been seen and evaluated by psychiatry.  They believe the patient is safe for discharge home from a psychiatric standpoint.  Patient's medical work-up is been largely nonrevealing.  We will discharge home with outpatient resources.   Minna Antis, MD 09/17/21 [phone]

## 2021-09-17 NOTE — Discharge Instructions (Addendum)
You have been seen in the emergency department for a  psychiatric concern. You have been evaluated both medically as well as psychiatrically. Please follow-up with your outpatient resources provided. Return to the emergency department for any worsening symptoms, or any thoughts of hurting yourself or anyone else so that we may attempt to help you. 

## 2021-09-17 NOTE — Consult Note (Signed)
[facility] Face-to-Face Psychiatry Consult   Reason for Consult: Psychiatric Evaluation Referring Physician: Dr. Cyril Loosen Patient Identification: Edward Shelton MRN:  [id number] Principal Diagnosis: <principal problem not specified> Diagnosis:  Active Problems:   Polysubstance abuse (HCC)   Substance induced mood disorder (HCC)   Total Time spent with patient: 1 hour  Subjective: "I am scared to be by myself. I am not sure what I will do."  Edward Shelton is a 21 y.o. male patient presented to [facility] ED voluntarily. Per the triage nurse note, Pt reports SI since he left here in March 2023.  Pt has recent cuts marks to left forearm. Superficial abrasions to arm. Denies drugs or etoh use   States he used an eyebrow cutter. Pt tearful .   Pt wearing ankle monitor on right ankle.   The patient shared that he does not feel safe returning home; he admitted to having thoughts of SI and being surrounded by friends who use it. The patient expressed that his emotions feel "broken every day" and admitted to daily crying spells. The patient shared that SIB with his last cutting episode was three days ago. The patient is unable to identify specific stressors. The patient's protective factors include stable housing, self-employment, and physical health. The patient reported that he is not connected to any services now. The patient had an anxious affect and a depressed mood.  This provider saw the patient face-to-face; the chart was reviewed, and consulted with Dr. Sidney Ace on 09/16/2021 due to the patient's care. It was discussed with the EDP that the patient remained under observation overnight and will be reassessed in the a.m. to determine if he meets the criteria for psychiatric inpatient admission; he could be discharged home.  On evaluation, the patient is alert and oriented x 4, calm, cooperative, and mood-congruent with affect. The patient does not appear to be responding to internal or external stimuli.  Neither is the patient presenting with any delusional thinking. The patient denies auditory or visual hallucinations. The patient denies any active suicidal, homicidal, or self-harm ideations. The patient did share that he is unsure that if he is alone, he does not know what he could do to himself. The patient is not presenting with any psychotic or paranoid behaviors. During an encounter with the patient, he could answer questions appropriately.  HPI: Per Dr. Sidney Ace, Edward Shelton is a 21 y.o. male past medical history of polysubstance use disorder who presents with anxiety.  Patient is here voluntarily.  He told police that he wanted to kill himself.  Patient was recently here for suicidal ideation and went to old [place].  Currently staying at a house with several other people.  Patient is quite anxious and having difficulty talking to me on arrival.  He cannot really vocalize what is going on but he is hyperventilating and looks anxious having difficulty sitting still  Past Psychiatric History:   Risk to Self:   Risk to Others:   Prior Inpatient Therapy:   Prior Outpatient Therapy:    Past Medical History: History reviewed. No pertinent past medical history. History reviewed. No pertinent surgical history. Family History:  Family History  Problem Relation Age of Onset   Healthy Mother    Healthy Father    Family Psychiatric  History:  Social History:  Social History   Substance and Sexual Activity  Alcohol Use Yes     Social History   Substance and Sexual Activity  Drug Use Yes   Types: Marijuana,  IV   Comment: shrooms, MDMA, ketamine    Social History   Socioeconomic History   Marital status: Single    Spouse name: Not on file   Number of children: Not on file   Years of education: Not on file   Highest education level: Not on file  Occupational History   Not on file  Tobacco Use   Smoking status: Every Day    Types: Cigarettes   Smokeless tobacco: Current   Vaping Use   Vaping Use: Every day  Substance and Sexual Activity   Alcohol use: Yes   Drug use: Yes    Types: Marijuana, IV    Comment: shrooms, MDMA, ketamine   Sexual activity: Yes  Other Topics Concern   Not on file  Social History Narrative   Not on file   Social Determinants of Health   Financial Resource Strain: Not on file  Food Insecurity: Not on file  Transportation Needs: Not on file  Physical Activity: Not on file  Stress: Not on file  Social Connections: Not on file   Additional Social History:    Allergies:  No Known Allergies  Labs:  Results for orders placed or performed during the hospital encounter of 09/16/21 (from the past 48 hour(s))  Comprehensive metabolic panel     Status: Abnormal   Collection Time: 09/16/21 10:38 PM  Result Value Ref Range   Sodium 138 135 - 145 mmol/L   Potassium 3.4 (L) 3.5 - 5.1 mmol/L   Chloride 104 98 - 111 mmol/L   CO2 23 22 - 32 mmol/L   Glucose, Bld 145 (H) 70 - 99 mg/dL    Comment: Glucose reference range applies only to samples taken after fasting for at least 8 hours.   BUN 14 6 - 20 mg/dL   Creatinine, Ser 3.64 0.61 - 1.24 mg/dL   Calcium 9.2 8.9 - 68.0 mg/dL   Total Protein 7.9 6.5 - 8.1 g/dL   Albumin 4.7 3.5 - 5.0 g/dL   AST 17 15 - 41 U/L   ALT 16 0 - 44 U/L   Alkaline Phosphatase 93 38 - 126 U/L   Total Bilirubin 1.0 0.3 - 1.2 mg/dL   GFR, Estimated >32 >12 mL/min    Comment: (NOTE) Calculated using the CKD-EPI Creatinine Equation (2021)    Anion gap 11 5 - 15    Comment: Performed at [facility], [address] [place], [place] [address]  Ethanol     Status: None   Collection Time: 09/16/21 10:38 PM  Result Value Ref Range   Alcohol, Ethyl (B) <10 <10 mg/dL    Comment: (NOTE) Lowest detectable limit for serum alcohol is 10 mg/dL.  For medical purposes only. Performed at [facility], [address] [place], [place] [address]   Salicylate level     Status: Abnormal    Collection Time: 09/16/21 10:38 PM  Result Value Ref Range   Salicylate Lvl <7.0 (L) 7.0 - 30.0 mg/dL    Comment: Performed at [facility], [address] [place], [place] [address]  Acetaminophen level     Status: Abnormal   Collection Time: 09/16/21 10:38 PM  Result Value Ref Range   Acetaminophen (Tylenol), Serum <10 (L) 10 - 30 ug/mL    Comment: (NOTE) Therapeutic concentrations vary significantly. A range of 10-30 ug/mL  may be an effective concentration for many patients. However, some  are best treated at concentrations outside of this range. Acetaminophen concentrations >150 ug/mL  at 4 hours after ingestion  and >50 ug/mL at 12 hours after ingestion are often associated with  toxic reactions.  Performed at [facility], [address] [place], [place] [address]   cbc     Status: Abnormal   Collection Time: 09/16/21 10:38 PM  Result Value Ref Range   WBC 11.0 (H) 4.0 - 10.5 K/uL   RBC 5.40 4.22 - 5.81 MIL/uL   Hemoglobin 16.0 13.0 - 17.0 g/dL   HCT 68.0 88.1 - 10.3 %   MCV 83.7 80.0 - 100.0 fL   MCH 29.6 26.0 - 34.0 pg   MCHC 35.4 30.0 - 36.0 g/dL   RDW 15.9 45.8 - 59.2 %   Platelets 209 150 - 400 K/uL   nRBC 0.0 0.0 - 0.2 %    Comment: Performed at [facility], [address] [place], [place] [address]  Resp Panel by RT-PCR (Flu A&B, Covid) Anterior Nasal Swab     Status: None   Collection Time: 09/16/21 10:39 PM   Specimen: Anterior Nasal Swab  Result Value Ref Range   SARS Coronavirus 2 by RT PCR NEGATIVE NEGATIVE    Comment: (NOTE) SARS-CoV-2 target nucleic acids are NOT DETECTED.  The SARS-CoV-2 RNA is generally detectable in upper respiratory specimens during the acute phase of infection. The lowest concentration of SARS-CoV-2 viral copies this assay can detect is 138 copies/mL. A negative result does not preclude SARS-Cov-2 infection and should not be used as the sole basis for treatment or other patient management  decisions. A negative result may occur with  improper specimen collection/handling, submission of specimen other than nasopharyngeal swab, presence of viral mutation(s) within the areas targeted by this assay, and inadequate number of viral copies(<138 copies/mL). A negative result must be combined with clinical observations, patient history, and epidemiological information. The expected result is Negative.  Fact Sheet for Patients:  BloggerCourse.com  Fact Sheet for Healthcare Providers:  SeriousBroker.it  This test is no t yet approved or cleared by the [place] FDA and  has been authorized for detection and/or diagnosis of SARS-CoV-2 by FDA under an Emergency Use Authorization (EUA). This EUA will remain  in effect (meaning this test can be used) for the duration of the COVID-19 declaration under Section 564(b)(1) of the Act, 21 U.S.C.section 360bbb-3(b)(1), unless the authorization is terminated  or revoked sooner.       Influenza A by PCR NEGATIVE NEGATIVE   Influenza B by PCR NEGATIVE NEGATIVE    Comment: (NOTE) The Xpert Xpress SARS-CoV-2/FLU/RSV plus assay is intended as an aid in the diagnosis of influenza from Nasopharyngeal swab specimens and should not be used as a sole basis for treatment. Nasal washings and aspirates are unacceptable for Xpert Xpress SARS-CoV-2/FLU/RSV testing.  Fact Sheet for Patients: BloggerCourse.com  Fact Sheet for Healthcare Providers: SeriousBroker.it  This test is not yet approved or cleared by the [place] FDA and has been authorized for detection and/or diagnosis of SARS-CoV-2 by FDA under an Emergency Use Authorization (EUA). This EUA will remain in effect (meaning this test can be used) for the duration of the COVID-19 declaration under Section 564(b)(1) of the Act, 21 U.S.C. section 360bbb-3(b)(1), unless the authorization  is terminated or revoked.  Performed at [facility], [address] [place], [place] [address]     No current facility-administered medications for this encounter.   No current outpatient medications on file.    Musculoskeletal: Strength & Muscle Tone: within normal limits Gait &  Station: normal Patient leans: N/A  Psychiatric Specialty Exam:  Presentation  General Appearance: Bizarre  Eye Contact:Minimal  Speech:Garbled; Slurred  Speech Volume:Decreased  Handedness:Right   Mood and Affect  Mood:Euphoric; Hopeless; Irritable; Depressed  Affect:Full Range; Depressed; Inappropriate   Thought Process  Thought Processes:Disorganized  Descriptions of Associations:Loose  Orientation:Partial  Thought Content:Illogical; Scattered; Tangential; Rumination  History of Schizophrenia/Schizoaffective disorder:No  Duration of Psychotic Symptoms:No data recorded Hallucinations:Hallucinations: None  Ideas of Reference:Paranoia; Delusions  Suicidal Thoughts:Suicidal Thoughts: Yes, Active SI Active Intent and/or Plan: Without Intent; Without Means to Carry Out; With Access to Means; Without Access to Means  Homicidal Thoughts:Homicidal Thoughts: No   Sensorium  Memory:Immediate Poor; Recent Poor; Remote Poor  Judgment:Poor  Insight:Poor   Executive Functions  Concentration:Fair  Attention Span:Poor  Recall:Poor  Fund of Knowledge:Poor  Language:Poor   Psychomotor Activity  Psychomotor Activity:Psychomotor Activity: Normal   Assets  Assets:Communication Skills; Resilience; Social Support   Sleep  Sleep:Sleep: Fair Number of Hours of Sleep: 6   Physical Exam: Physical Exam Vitals and nursing note reviewed.  Constitutional:      Appearance: He is normal weight.  HENT:     Head: Normocephalic and atraumatic.     Right Ear: External ear normal.     Left Ear: External ear normal.     Nose: Nose normal.     Mouth/Throat:      Mouth: Mucous membranes are moist.  Cardiovascular:     Rate and Rhythm: Normal rate.     Pulses: Normal pulses.  Pulmonary:     Effort: Pulmonary effort is normal.  Musculoskeletal:        General: Normal range of motion.     Cervical back: Normal range of motion and neck supple.  Neurological:     General: No focal deficit present.     Mental Status: He is alert and oriented to person, place, and time.  Psychiatric:        Attention and Perception: Attention and perception normal.        Mood and Affect: Mood is anxious, depressed and elated. Affect is blunt and inappropriate.        Speech: Speech normal.        Behavior: Behavior is uncooperative, agitated and withdrawn.        Thought Content: Thought content is delusional. Thought content includes suicidal ideation.        Cognition and Memory: Cognition is impaired. Memory is impaired. He exhibits impaired recent memory and impaired remote memory.        Judgment: Judgment is impulsive and inappropriate.    Review of Systems  Psychiatric/Behavioral:  Positive for depression, substance abuse and suicidal ideas. The patient is nervous/anxious.   All other systems reviewed and are negative.  Blood pressure 132/89, pulse (!) 120, temperature 97.7 F (36.5 C), temperature source Oral, resp. rate (!) 22, SpO2 99 %. There is no height or weight on file to calculate BMI.  Treatment Plan Summary: Plan The patient remained under observation overnight and will be reassessed in the a.m. to determine if he meets the criteria for psychiatric inpatient admission; he could be discharged home.  Disposition: Supportive therapy provided about ongoing stressors. The patient remained under observation overnight and will be reassessed in the a.m. to determine if he meets the criteria for psychiatric inpatient admission; he could be discharged home.  Gillermo Murdoch, NP 09/17/2021 2:39 AM

## 2021-09-30 ENCOUNTER — Emergency Department
Admission: EM | Admit: 2021-09-30 | Discharge: 2021-09-30 | Disposition: A | Discharge status: Home / Self Care | Payer: Medicaid Other | Attending: Emergency Medicine | Admitting: Emergency Medicine

## 2021-09-30 ENCOUNTER — Encounter: Payer: Self-pay | Admitting: Emergency Medicine

## 2021-09-30 ENCOUNTER — Other Ambulatory Visit: Payer: Self-pay

## 2021-09-30 DIAGNOSIS — F17210 Nicotine dependence, cigarettes, uncomplicated: Secondary | ICD-10-CM | POA: Diagnosis not present

## 2021-09-30 DIAGNOSIS — Z046 Encounter for general psychiatric examination, requested by authority: Secondary | ICD-10-CM | POA: Diagnosis present

## 2021-09-30 DIAGNOSIS — R45851 Suicidal ideations: Secondary | ICD-10-CM | POA: Insufficient documentation

## 2021-09-30 DIAGNOSIS — F1994 Other psychoactive substance use, unspecified with psychoactive substance-induced mood disorder: Secondary | ICD-10-CM | POA: Diagnosis present

## 2021-09-30 DIAGNOSIS — F419 Anxiety disorder, unspecified: Secondary | ICD-10-CM

## 2021-09-30 DIAGNOSIS — Y900 Blood alcohol level of less than 20 mg/100 ml: Secondary | ICD-10-CM | POA: Diagnosis not present

## 2021-09-30 DIAGNOSIS — F1914 Other psychoactive substance abuse with psychoactive substance-induced mood disorder: Secondary | ICD-10-CM | POA: Insufficient documentation

## 2021-09-30 DIAGNOSIS — F1210 Cannabis abuse, uncomplicated: Secondary | ICD-10-CM | POA: Diagnosis not present

## 2021-09-30 DIAGNOSIS — R5381 Other malaise: Secondary | ICD-10-CM | POA: Insufficient documentation

## 2021-09-30 DIAGNOSIS — F418 Other specified anxiety disorders: Secondary | ICD-10-CM | POA: Diagnosis not present

## 2021-09-30 LAB — CBC
HCT: 42.6 % (ref 39.0–52.0)
Hemoglobin: 15.2 g/dL (ref 13.0–17.0)
MCH: 30.1 pg (ref 26.0–34.0)
MCHC: 35.7 g/dL (ref 30.0–36.0)
MCV: 84.4 fL (ref 80.0–100.0)
Platelets: 212 10*3/uL (ref 150–400)
RBC: 5.05 MIL/uL (ref 4.22–5.81)
RDW: 12.9 % (ref 11.5–15.5)
WBC: 6.7 10*3/uL (ref 4.0–10.5)
nRBC: 0 % (ref 0.0–0.2)

## 2021-09-30 LAB — COMPREHENSIVE METABOLIC PANEL
ALT: 16 U/L (ref 0–44)
AST: 16 U/L (ref 15–41)
Albumin: 4.5 g/dL (ref 3.5–5.0)
Alkaline Phosphatase: 77 U/L (ref 38–126)
Anion gap: 7 (ref 5–15)
BUN: 14 mg/dL (ref 6–20)
CO2: 27 mmol/L (ref 22–32)
Calcium: 9.5 mg/dL (ref 8.9–10.3)
Chloride: 104 mmol/L (ref 98–111)
Creatinine, Ser: 0.77 mg/dL (ref 0.61–1.24)
GFR, Estimated: >60 mL/min (ref >60)
Glucose, Bld: 113 mg/dL — ABNORMAL HIGH (ref 70–99)
Potassium: 3.5 mmol/L (ref 3.5–5.1)
Sodium: 138 mmol/L (ref 135–145)
Total Bilirubin: 1.1 mg/dL (ref 0.3–1.2)
Total Protein: 7.4 g/dL (ref 6.5–8.1)

## 2021-09-30 LAB — SALICYLATE LEVEL: Salicylate Lvl: <7 mg/dL — ABNORMAL LOW (ref 7.0–30.0)

## 2021-09-30 LAB — ACETAMINOPHEN LEVEL: Acetaminophen (Tylenol), Serum: <10 ug/mL — ABNORMAL LOW (ref 10–30)

## 2021-09-30 LAB — ETHANOL: Alcohol, Ethyl (B): <10 mg/dL (ref <10)

## 2021-09-30 LAB — URINE DRUG SCREEN, QUALITATIVE (ARMC ONLY)
Amphetamines, Ur Screen: NOT DETECTED
Barbiturates, Ur Screen: NOT DETECTED
Benzodiazepine, Ur Scrn: NOT DETECTED
Cannabinoid 50 Ng, Ur ~~LOC~~: POSITIVE — AB
Cocaine Metabolite,Ur ~~LOC~~: NOT DETECTED
MDMA (Ecstasy)Ur Screen: NOT DETECTED
Methadone Scn, Ur: NOT DETECTED
Opiate, Ur Screen: NOT DETECTED
Phencyclidine (PCP) Ur S: NOT DETECTED
Tricyclic, Ur Screen: NOT DETECTED

## 2021-09-30 NOTE — ED Notes (Signed)
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 Belongings include 2 bags-black backpack 2 grey colored earring, yellow necklace with yellow charm, shoes, socks, pants, underwear, shirt

## 2021-09-30 NOTE — ED Provider Notes (Signed)
[facility] Provider Note    Event Date/Time   First MD Initiated Contact with Patient 09/30/21 0532     (approximate)   History   Depression and SI   HPI  Edward Shelton is a 21 y.o. male who presents to the ED voluntarily with a chief complaint of anxiety.  Patient with a history of polysubstance abuse and substance-induced mood disorder who has a psychiatry appointment today.  States he gets very anxious when left alone and suicidal thoughts and again.  He was alone overnight began to have racing thoughts.  Denies active plan.  Denies HI/AH/VH.  Took his medication prior to arrival.  Thinks his antidepressants are not working and did not feel like he could be alone for the next few hours until his psychiatry appointment.  Even though his mother was at the house, he did not want to wake her up so he called EMS to come to the ED.  Voices no medical complaints.   Past Medical History  History reviewed. No pertinent past medical history.   Active Problem List   Patient Active Problem List   Diagnosis Date Noted   Substance induced mood disorder (HCC) 06/04/2021   Polysubstance abuse (HCC) 06/03/2021     Past Surgical History  History reviewed. No pertinent surgical history.   Home Medications   Prior to Admission medications   Not on File     Allergies  Patient has no known allergies.   Family History   Family History  Problem Relation Age of Onset   Healthy Mother    Healthy Father      Physical Exam  Triage Vital Signs: ED Triage Vitals  Enc Vitals Group     BP      Pulse      Resp      Temp      Temp src      SpO2      Weight      Height      Head Circumference      Peak Flow      Pain Score      Pain Loc      Pain Edu?      Excl. in GC?     Updated Vital Signs: BP 132/87 (BP Location: Left Arm)   Pulse (!) 112   Temp 98.1 F (36.7 C) (Oral)   Resp 14   Ht 5\' 3"  (1.6 m)   Wt 54.4 kg   SpO2 100%   BMI  21.24 kg/m    General: Awake, no distress.  Asked patient to put down his cell phone in order to participate in interview. CV:  RRR.  Good peripheral perfusion.  Resp:  Normal effort.  CTA B. Abd:  Nontender.  No distention.  Other:  Mildly anxious   ED Results / Procedures / Treatments  Labs (all labs ordered are listed, but only abnormal results are displayed) Labs Reviewed  COMPREHENSIVE METABOLIC PANEL - Abnormal; Notable for the following components:      Result Value   Glucose, Bld 113 (*)    All other components within normal limits  SALICYLATE LEVEL - Abnormal; Notable for the following components:   Salicylate Lvl <7.0 (*)    All other components within normal limits  ACETAMINOPHEN LEVEL - Abnormal; Notable for the following components:   Acetaminophen (Tylenol), Serum <10 (*)    All other components within normal limits  ETHANOL  CBC  URINE DRUG  SCREEN, QUALITATIVE ([facility] ONLY)     EKG  None   RADIOLOGY None   Official radiology report(s): No results found.   PROCEDURES:  Critical Care performed: No  Procedures   MEDICATIONS ORDERED IN ED: Medications - No data to display   IMPRESSION / MDM / ASSESSMENT AND PLAN / ED COURSE  I reviewed the triage vital signs and the nursing notes.                             21 year old male presents to the ED with anxiety and vague SI without plan.  Contracts for safety in the emergency department. The patient has been placed in psychiatric observation due to the need to provide a safe environment for the patient while obtaining psychiatric consultation and evaluation, as well as ongoing medical and medication management to treat the patient's condition.  The patient has not been placed under full IVC at this time.  Patient's presentation is most consistent with exacerbation of chronic illness.  Patient took his home medication prior to arrival and has a psychiatry appointment today.  If he desires to leave the  emergency department in order to go to his psychiatry appointment, I think that would be reasonable.  Do not see criteria for IVC.  Patient remains in the emergency department voluntarily pending psychiatric evaluation and disposition.  FINAL CLINICAL IMPRESSION(S) / ED DIAGNOSES   Final diagnoses:  Anxiety     Rx / DC Orders   ED Discharge Orders     None        Note:  This document was prepared using Dragon voice recognition software and may include unintentional dictation errors.   Irean Hong, MD 09/30/21 [phone]

## 2021-09-30 NOTE — ED Notes (Signed)
Pt resting on stretcher in hallway with eyes closed and even respirations. No acute distress noted at this time.

## 2021-09-30 NOTE — Discharge Instructions (Signed)
You have been seen in the Emergency Department (ED) today for a psychiatric complaint.  You have been evaluated by psychiatry and we believe you are safe to be discharged from the hospital.    Follow up with your doctor and/or therapist as soon as possible regarding today's ED visit.   Please follow up any other recommendations and clinic appointments provided by the psychiatry team that saw you in the Emergency Department.

## 2021-09-30 NOTE — Consult Note (Signed)
[facility] Face-to-Face Psychiatry Consult   Reason for Consult:  anxiety Referring Physician:  EDP Patient Identification: Edward Shelton MRN:  [id number] Principal Diagnosis: Substance induced mood disorder (HCC) Diagnosis:  Principal Problem:   Substance induced mood disorder (HCC)   Total Time spent with patient: 30 minutes  Subjective:   Edward Shelton is a 21 y.o. male patient admitted with anxiety.  HPI:  Patient has presented to the ED before with anxiety and substance-induced mood disorder.  Today he presented with "not wanting to be alone" before he had his psych appointment this morning, but he doesn't fully explain why. He adamantly denies suicidal thoughts, just saying that he didn't want to be alone. He reports that his mother was  home, but he didn't want to wake her so he called EMS.  Patient admits that he did smoke some cannabis last night and he admits that it actually does make him more anxious.  Denies other illicit drug use. Patient denies homicidal thoughts, paranoia, auditory or visual hallucinations.  Patient speaks in clear, linear sentences. He denies auditory or visual hallucinations or paranoia. Denies thoughts of self-harm or suicide. Denies thoughts of harm against others. He is asking to be discharged, saying that he thought he would only be here a few hours prior to his appointment (he sees medication provider online). Patient is prescribed hydroxyzine, duloxetine, mirtazapine by that provider. He states that he goes to [facility] for group therapy and he has that at 10 and can make that if he is discharged soon.  Writer discussed with patient that it would be important for him to stop the cannabis use,  it seems it may be producing anxiety and maybe paranoia and is not helping. Patient agrees.   Patient does not meet criteria for involuntary commitment and he is asking for discharge. Patient calling a friend to pick him up.   Past Psychiatric History: substance  induced mood disorder; suicidal thoughts. One prior psychiatric admission   Risk to Self:   Risk to Others:   Prior Inpatient Therapy:   Prior Outpatient Therapy:    Past Medical History: History reviewed. No pertinent past medical history. History reviewed. No pertinent surgical history. Family History:  Family History  Problem Relation Age of Onset   Healthy Mother    Healthy Father    Family Psychiatric  History: known reported Social History:  Social History   Substance and Sexual Activity  Alcohol Use Yes     Social History   Substance and Sexual Activity  Drug Use Yes   Types: Marijuana, IV   Comment: shrooms, MDMA, ketamine    Social History   Socioeconomic History   Marital status: Single    Spouse name: Not on file   Number of children: Not on file   Years of education: Not on file   Highest education level: Not on file  Occupational History   Not on file  Tobacco Use   Smoking status: Every Day    Types: Cigarettes   Smokeless tobacco: Current  Vaping Use   Vaping Use: Every day  Substance and Sexual Activity   Alcohol use: Yes   Drug use: Yes    Types: Marijuana, IV    Comment: shrooms, MDMA, ketamine   Sexual activity: Yes  Other Topics Concern   Not on file  Social History Narrative   Not on file   Social Determinants of Health   Financial Resource Strain: Not on file  Food Insecurity: Not on  file  Transportation Needs: Not on file  Physical Activity: Not on file  Stress: Not on file  Social Connections: Not on file   Additional Social History:    Allergies:  No Known Allergies  Labs:  Results for orders placed or performed during the hospital encounter of 09/30/21 (from the past 48 hour(s))  Comprehensive metabolic panel     Status: Abnormal   Collection Time: 09/30/21  5:48 AM  Result Value Ref Range   Sodium 138 135 - 145 mmol/L   Potassium 3.5 3.5 - 5.1 mmol/L   Chloride 104 98 - 111 mmol/L   CO2 27 22 - 32 mmol/L    Glucose, Bld 113 (H) 70 - 99 mg/dL    Comment: Glucose reference range applies only to samples taken after fasting for at least 8 hours.   BUN 14 6 - 20 mg/dL   Creatinine, Ser 4.49 0.61 - 1.24 mg/dL   Calcium 9.5 8.9 - 20.1 mg/dL   Total Protein 7.4 6.5 - 8.1 g/dL   Albumin 4.5 3.5 - 5.0 g/dL   AST 16 15 - 41 U/L   ALT 16 0 - 44 U/L   Alkaline Phosphatase 77 38 - 126 U/L   Total Bilirubin 1.1 0.3 - 1.2 mg/dL   GFR, Estimated >00 >71 mL/min    Comment: (NOTE) Calculated using the CKD-EPI Creatinine Equation (2021)    Anion gap 7 5 - 15    Comment: Performed at [facility], [address] [place], [place] [address]  Ethanol     Status: None   Collection Time: 09/30/21  5:48 AM  Result Value Ref Range   Alcohol, Ethyl (B) <10 <10 mg/dL    Comment: (NOTE) Lowest detectable limit for serum alcohol is 10 mg/dL.  For medical purposes only. Performed at [facility], [address] [place], [place] [address]   Salicylate level     Status: Abnormal   Collection Time: 09/30/21  5:48 AM  Result Value Ref Range   Salicylate Lvl <7.0 (L) 7.0 - 30.0 mg/dL    Comment: Performed at [facility], [address] [place], [place] [address]  Acetaminophen level     Status: Abnormal   Collection Time: 09/30/21  5:48 AM  Result Value Ref Range   Acetaminophen (Tylenol), Serum <10 (L) 10 - 30 ug/mL    Comment: (NOTE) Therapeutic concentrations vary significantly. A range of 10-30 ug/mL  may be an effective concentration for many patients. However, some  are best treated at concentrations outside of this range. Acetaminophen concentrations >150 ug/mL at 4 hours after ingestion  and >50 ug/mL at 12 hours after ingestion are often associated with  toxic reactions.  Performed at [facility], [address] [place], [place] [address]   cbc     Status: None   Collection Time: 09/30/21  5:48 AM  Result Value Ref Range   WBC 6.7 4.0 - 10.5 K/uL    RBC 5.05 4.22 - 5.81 MIL/uL   Hemoglobin 15.2 13.0 - 17.0 g/dL   HCT 09.4 07.6 - 80.8 %   MCV 84.4 80.0 - 100.0 fL   MCH 30.1 26.0 - 34.0 pg   MCHC 35.7 30.0 - 36.0 g/dL   RDW 81.1 03.1 - 59.4 %   Platelets 212 150 - 400 K/uL   nRBC 0.0 0.0 - 0.2 %    Comment: Performed at [facility], [address] [place], [place] [address]  Urine Drug Screen,  Qualitative     Status: Abnormal   Collection Time: 09/30/21  6:32 AM  Result Value Ref Range   Tricyclic, Ur Screen NONE DETECTED NONE DETECTED   Amphetamines, Ur Screen NONE DETECTED NONE DETECTED   MDMA (Ecstasy)Ur Screen NONE DETECTED NONE DETECTED   Cocaine Metabolite,Ur [place] NONE DETECTED NONE DETECTED   Opiate, Ur Screen NONE DETECTED NONE DETECTED   Phencyclidine (PCP) Ur S NONE DETECTED NONE DETECTED   Cannabinoid 50 Ng, Ur [place] POSITIVE (A) NONE DETECTED   Barbiturates, Ur Screen NONE DETECTED NONE DETECTED   Benzodiazepine, Ur Scrn NONE DETECTED NONE DETECTED   Methadone Scn, Ur NONE DETECTED NONE DETECTED    Comment: (NOTE) Tricyclics + metabolites, urine    Cutoff 1000 ng/mL Amphetamines + metabolites, urine  Cutoff 1000 ng/mL MDMA (Ecstasy), urine              Cutoff 500 ng/mL Cocaine Metabolite, urine          Cutoff 300 ng/mL Opiate + metabolites, urine        Cutoff 300 ng/mL Phencyclidine (PCP), urine         Cutoff 25 ng/mL Cannabinoid, urine                 Cutoff 50 ng/mL Barbiturates + metabolites, urine  Cutoff 200 ng/mL Benzodiazepine, urine              Cutoff 200 ng/mL Methadone, urine                   Cutoff 300 ng/mL  The urine drug screen provides only a preliminary, unconfirmed analytical test result and should not be used for non-medical purposes. Clinical consideration and professional judgment should be applied to any positive drug screen result due to possible interfering substances. A more specific alternate chemical method must be used in order to obtain a confirmed analytical result. Gas  chromatography / mass spectrometry (GC/MS) is the preferred confirm atory method. Performed at [facility], [address] [place], [place] [address]     No current facility-administered medications for this encounter.   No current outpatient medications on file.    Musculoskeletal: Strength & Muscle Tone: within normal limits Gait & Station: normal Patient leans: N/A            Psychiatric Specialty Exam:  Presentation  General Appearance: Appropriate for Environment  Eye Contact:Good  Speech:Clear and Coherent  Speech Volume:Normal  Handedness:Right   Mood and Affect  Mood:Euthymic  Affect:Congruent   Thought Process  Thought Processes:Coherent  Descriptions of Associations:Intact  Orientation:Full (Time, Place and Person)  Thought Content:WDL  History of Schizophrenia/Schizoaffective disorder:No  Duration of Psychotic Symptoms:No data recorded Hallucinations:Hallucinations: None  Ideas of Reference:None  Suicidal Thoughts:Suicidal Thoughts: No  Homicidal Thoughts:Homicidal Thoughts: No   Sensorium  Memory:Immediate Good  Judgment:Good  Insight:Fair   Executive Functions  Concentration:Good  Attention Span:Good  Recall:Good  Fund of Knowledge:Fair  Language:Good   Psychomotor Activity  Psychomotor Activity:Psychomotor Activity: Normal   Assets  Assets:Communication Skills; Desire for Improvement; Financial Resources/Insurance; Housing; Social Support; Resilience; Physical Health   Sleep  Sleep:Sleep: Fair   Physical Exam: Physical Exam Vitals and nursing note reviewed.  HENT:     Head: Normocephalic.     Nose: No congestion or rhinorrhea.  Eyes:     General:        Right eye: No discharge.        Left eye: No discharge.  Pulmonary:  Effort: Pulmonary effort is normal.  Musculoskeletal:        General: Normal range of motion.     Cervical back: Normal range of motion.  Skin:    General:  Skin is dry.  Neurological:     Mental Status: He is alert and oriented to person, place, and time.  Psychiatric:        Behavior: Behavior normal.    Review of Systems  Constitutional: Negative.   Respiratory: Negative.    Cardiovascular: Negative.   Musculoskeletal: Negative.   Skin: Negative.   Neurological: Negative.   Psychiatric/Behavioral:  Positive for depression (stable). The patient is nervous/anxious (stable).    Blood pressure 132/87, pulse (!) 112, temperature 98.1 F (36.7 C), temperature source Oral, resp. rate 14, height 5\' 3"  (1.6 m), weight 54.4 kg, SpO2 100 %. Body mass index is 21.24 kg/m.  Treatment Plan Summary: Plan Patient is voluntary and is requesting discharge. Discussed consider not using cannabis or other illicit substances, which may improve anxiety and mood symptoms. Patient will be going to  therapy at [facility] when he leaves the ED. He is aware to return if symptoms worsen. Reviewed with EDP   Disposition: No evidence of imminent risk to self or others at present.   Patient does not meet criteria for psychiatric inpatient admission. Supportive therapy provided about ongoing stressors. Discussed crisis plan, support from social network, calling 911, coming to the Emergency Department, and calling Suicide Hotline.  [facility], NP 09/30/2021 3:00 PM

## 2021-09-30 NOTE — ED Notes (Signed)
Pt reports his "anti-depressants" are not working; he has a follow-up appt with prescriber this AM but did not feel like he could be alone for the next two hours. Mother at house and did not want to awaken her, so called EMS. Pt requesting a different medication because he is still "sad" but denies suicidal thoughts.

## 2021-10-03 ENCOUNTER — Emergency Department
Admission: EM | Admit: 2021-10-03 | Discharge: 2021-10-04 | Disposition: A | Discharge status: Other Acute Inpatient Hospital | Payer: Medicaid Other | Attending: Emergency Medicine | Admitting: Emergency Medicine

## 2021-10-03 ENCOUNTER — Other Ambulatory Visit: Payer: Self-pay

## 2021-10-03 DIAGNOSIS — Z20822 Contact with and (suspected) exposure to covid-19: Secondary | ICD-10-CM | POA: Insufficient documentation

## 2021-10-03 DIAGNOSIS — F32A Depression, unspecified: Secondary | ICD-10-CM | POA: Insufficient documentation

## 2021-10-03 DIAGNOSIS — S41112A Laceration without foreign body of left upper arm, initial encounter: Secondary | ICD-10-CM | POA: Insufficient documentation

## 2021-10-03 DIAGNOSIS — X789XXA Intentional self-harm by unspecified sharp object, initial encounter: Secondary | ICD-10-CM | POA: Diagnosis not present

## 2021-10-03 DIAGNOSIS — S4992XA Unspecified injury of left shoulder and upper arm, initial encounter: Secondary | ICD-10-CM | POA: Diagnosis present

## 2021-10-03 DIAGNOSIS — F1910 Other psychoactive substance abuse, uncomplicated: Secondary | ICD-10-CM | POA: Diagnosis present

## 2021-10-03 DIAGNOSIS — X838XXA Intentional self-harm by other specified means, initial encounter: Secondary | ICD-10-CM | POA: Insufficient documentation

## 2021-10-03 DIAGNOSIS — X838XXD Intentional self-harm by other specified means, subsequent encounter: Secondary | ICD-10-CM | POA: Diagnosis not present

## 2021-10-03 DIAGNOSIS — Z046 Encounter for general psychiatric examination, requested by authority: Secondary | ICD-10-CM | POA: Insufficient documentation

## 2021-10-03 DIAGNOSIS — Z7289 Other problems related to lifestyle: Secondary | ICD-10-CM

## 2021-10-03 DIAGNOSIS — R45851 Suicidal ideations: Secondary | ICD-10-CM | POA: Diagnosis not present

## 2021-10-03 HISTORY — DX: Depression, unspecified: F32.A

## 2021-10-03 LAB — CBC WITH DIFFERENTIAL/PLATELET
Abs Immature Granulocytes: 0.03 10*3/uL (ref 0.00–0.07)
Basophils Absolute: 0.1 10*3/uL (ref 0.0–0.1)
Basophils Relative: 1 %
Eosinophils Absolute: 0.1 10*3/uL (ref 0.0–0.5)
Eosinophils Relative: 1 %
HCT: 43.8 % (ref 39.0–52.0)
Hemoglobin: 16.1 g/dL (ref 13.0–17.0)
Immature Granulocytes: 0 %
Lymphocytes Relative: 34 %
Lymphs Abs: 2.7 10*3/uL (ref 0.7–4.0)
MCH: 30.3 pg (ref 26.0–34.0)
MCHC: 36.8 g/dL — ABNORMAL HIGH (ref 30.0–36.0)
MCV: 82.5 fL (ref 80.0–100.0)
Monocytes Absolute: 0.8 10*3/uL (ref 0.1–1.0)
Monocytes Relative: 10 %
Neutro Abs: 4.4 10*3/uL (ref 1.7–7.7)
Neutrophils Relative %: 54 %
Platelets: 239 10*3/uL (ref 150–400)
RBC: 5.31 MIL/uL (ref 4.22–5.81)
RDW: 12.7 % (ref 11.5–15.5)
WBC: 8 10*3/uL (ref 4.0–10.5)
nRBC: 0 % (ref 0.0–0.2)

## 2021-10-03 LAB — ETHANOL: Alcohol, Ethyl (B): <10 mg/dL (ref <10)

## 2021-10-03 LAB — SALICYLATE LEVEL: Salicylate Lvl: <7 mg/dL — ABNORMAL LOW (ref 7.0–30.0)

## 2021-10-03 LAB — COMPREHENSIVE METABOLIC PANEL
ALT: 21 U/L (ref 0–44)
AST: 31 U/L (ref 15–41)
Albumin: 4.9 g/dL (ref 3.5–5.0)
Alkaline Phosphatase: 71 U/L (ref 38–126)
Anion gap: 8 (ref 5–15)
BUN: 9 mg/dL (ref 6–20)
CO2: 23 mmol/L (ref 22–32)
Calcium: 9.4 mg/dL (ref 8.9–10.3)
Chloride: 104 mmol/L (ref 98–111)
Creatinine, Ser: 0.76 mg/dL (ref 0.61–1.24)
GFR, Estimated: >60 mL/min (ref >60)
Glucose, Bld: 90 mg/dL (ref 70–99)
Potassium: 3.5 mmol/L (ref 3.5–5.1)
Sodium: 135 mmol/L (ref 135–145)
Total Bilirubin: 1 mg/dL (ref 0.3–1.2)
Total Protein: 7.9 g/dL (ref 6.5–8.1)

## 2021-10-03 LAB — ACETAMINOPHEN LEVEL: Acetaminophen (Tylenol), Serum: <10 ug/mL — ABNORMAL LOW (ref 10–30)

## 2021-10-03 MED ORDER — MIRTAZAPINE 15 MG PO TABS
15.0000 mg | ORAL_TABLET | Freq: Every day | ORAL | Status: DC
  Administered 2021-10-03: 15 mg via ORAL
  Filled 2021-10-03: qty 1

## 2021-10-03 MED ORDER — HYDROXYZINE HCL 25 MG PO TABS
25.0000 mg | ORAL_TABLET | Freq: Four times a day (QID) | ORAL | Status: DC | PRN
Start: 2021-10-03 — End: 2021-10-04
  Administered 2021-10-03: 25 mg via ORAL
  Filled 2021-10-03: qty 1

## 2021-10-03 MED ORDER — DULOXETINE HCL 30 MG PO CPEP
30.0000 mg | ORAL_CAPSULE | Freq: Every day | ORAL | Status: DC
  Filled 2021-10-03: qty 1

## 2021-10-03 NOTE — BH Assessment (Signed)
Comprehensive Clinical Assessment (CCA) Note  10/03/2021 Edward Shelton [id number] Edward Shelton [name] is a 21 year old, English speaking, Hispanic male with a substance induced mood disorder and polysubstance abuse. Pt presented to [facility] ED voluntarily and was later IVC's by the EDP. Per triage note: Pt stating they did "molly" last night and arrives today after taking ketamine today, But unsure when. Pt stating, they have poor memory.  Pt stating "I had a freak out". Pt showing me his arm that has many superficial cutting wounds. PT stating, they have felt depressed and were suicidal. Upon assessment Pt explained that he'd presented to the ED with complaints of needing his medications adjusted as they are ineffective, as well as experiencing worsening SI. Pt reported that he'd used Ketamine on 10/02/21 which triggered his suicidal thoughts. Pt had memory disturbance and was unable to remember why he'd presented to the ED in the immediate, as well as 3 days ago. Pt had lacking insight and was in denial about the severity of his substance abuse. Pt did not express a readiness for change. Pt admitted to experiencing memories that contribute to his suicidal thoughts that ultimately stem from his former LSD use. Pt reported that he relapsed due to his medications making him feel emotionless and wanting desperately to feel his emotions. Pt continues to engage in NSSIB and had multiple superficial cuts on his left forearm. Pt was cooperative and forthcoming throughout the assessment. Pt had clear and coherent speech. Pt was oriented x4. Pt had an anxious mood and a responsive affect. The pt. did not appear to be responding to internal/external stimuli nor did he present with any delusional thinking. The patient denied current SI, HI or AV/H.   Chief Complaint:  Chief Complaint  Patient presents with   Drug Overdose   Psychiatric Evaluation   Visit Diagnosis: Substance induced mood disorder (HCC)    CCA  Screening, Triage and Referral (STR)  Patient Reported Information How did you hear about [place]? Self  Referral name: No data recorded Referral phone number: No data recorded  Whom do you see for routine medical problems? No data recorded Practice/Facility Name: No data recorded Practice/Facility Phone Number: No data recorded Name of Contact: No data recorded Contact Number: No data recorded Contact Fax Number: No data recorded Prescriber Name: No data recorded Prescriber Address (if known): No data recorded  What Is the Reason for Your Visit/Call Today? Pt arrives today with concerns for OD. Pt stating they did "molly" last night and arrives today after taking ketamine today, But unsure when. Pt stating they have poor memory.  Pt stating "I had a freak out". Pt showing me his arm that has many superficial cutting wounds. PT stating they have felt depressed and were suicidal.  How Long Has This Been Causing You Problems? > than 6 months  What Do You Feel Would Help You the Most Today? Treatment for Depression or other mood problem   Have You Recently Been in Any Inpatient Treatment (Hospital/Detox/Crisis Center/28-Day Program)? No data recorded Name/Location of Program/Hospital:No data recorded How Long Were You There? No data recorded When Were You Discharged? No data recorded  Have You Ever Received Services From [facility] Before? No data recorded Who Do You See at [facility]? No data recorded  Have You Recently Had Any Thoughts About Hurting Yourself? Yes  Are You Planning to Commit Suicide/Harm Yourself At This time? No   Have you Recently Had Thoughts About Hurting Someone [name]? No  Explanation: No  data recorded  Have You Used Any Alcohol or Drugs in the Past 24 Hours? Yes  How Long Ago Did You Use Drugs or Alcohol? No data recorded What Did You Use and How Much? Ketamine   Do You Currently Have a Therapist/Psychiatrist? Yes  Name of Therapist/Psychiatrist: Pt  unable to recall   Have You Been Recently Discharged From Any Office Practice or Programs? No  Explanation of Discharge From Practice/Program: No data recorded    CCA Screening Triage Referral Assessment Type of Contact: Face-to-Face  Is this Initial or Reassessment? No data recorded Date Telepsych consult ordered in [facility]:  No data recorded Time Telepsych consult ordered in [facility]:  No data recorded  Patient Reported Information Reviewed? No data recorded Patient Left Without Being Seen? No data recorded Reason for Not Completing Assessment: No data recorded  Collateral Involvement: None provided   Does Patient Have a Court Appointed Legal Guardian? No data recorded Name and Contact of Legal Guardian: No data recorded If Minor and Not Living with Parent(s), Who has Custody? n/a  Is CPS involved or ever been involved? Never  Is APS involved or ever been involved? Never   Patient Determined To Be At Risk for Harm To Self or Others Based on Review of Patient Reported Information or Presenting Complaint? Yes, for Self-Harm  Method: No data recorded Availability of Means: No data recorded Intent: No data recorded Notification Required: No data recorded Additional Information for Danger to Others Potential: No data recorded Additional Comments for Danger to Others Potential: No data recorded Are There Guns or Other Weapons in Your Home? No data recorded Types of Guns/Weapons: No data recorded Are These Weapons Safely Secured?                            No data recorded Who Could Verify You Are Able To Have These Secured: No data recorded Do You Have any Outstanding Charges, Pending Court Dates, Parole/Probation? No data recorded Contacted To Inform of Risk of Harm To Self or Others: Other: Comment   Location of Assessment: [facility]   Does Patient Present under Involuntary Commitment? Yes  IVC Papers Initial File Date: 10/03/21   [place] of Residence: [place]   Patient  Currently Receiving the Following Services: Medication Management   Determination of Need: Emergent (2 hours)   Options For Referral: Inpatient Hospitalization     CCA Biopsychosocial Intake/Chief Complaint:  No data recorded Current Symptoms/Problems: No data recorded  Patient Reported Schizophrenia/Schizoaffective Diagnosis in Past: No   Strengths: Pt has good insight; pt is willing to participate in treatment; pt has stable housing; pt is employed.  Preferences: No data recorded Abilities: No data recorded  Type of Services Patient Feels are Needed: No data recorded  Initial Clinical Notes/Concerns: No data recorded  Mental Health Symptoms Depression:   Hopelessness; Sleep (too much or little); Change in energy/activity; Tearfulness   Duration of Depressive symptoms:  Greater than two weeks   Mania:   N/A   Anxiety:    Restlessness; Difficulty concentrating   Psychosis:   None   Duration of Psychotic symptoms: No data recorded  Trauma:   None   Obsessions:   Cause anxiety; Disrupts routine/functioning; Attempts to suppress/neutralize; Recurrent & persistent thoughts/impulses/images   Compulsions:   Intended to reduce stress or prevent another outcome; "Driven" to perform behaviors/acts; Repeated behaviors/mental acts; Poor Insight   Inattention:   None   Hyperactivity/Impulsivity:   None  Oppositional/Defiant Behaviors:   None   Emotional Irregularity:   Mood lability; Potentially harmful impulsivity; Recurrent suicidal behaviors/gestures/threats   Other Mood/Personality Symptoms:  No data recorded   Mental Status Exam Appearance and self-care  Stature:   Small   Weight:   Average weight   Clothing:   -- (Pt in scrubs)   Grooming:   Normal   Cosmetic use:   None   Posture/gait:   Normal   Motor activity:   Not Remarkable   Sensorium  Attention:   Normal   Concentration:   Normal   Orientation:   Situation; Place;  Person; Object   Recall/memory:   Normal   Affect and Mood  Affect:   Full Range   Mood:   Dysphoric   Relating  Eye contact:   Normal   Facial expression:   Responsive   Attitude toward examiner:   Cooperative   Thought and Language  Speech flow:  Clear and Coherent   Thought content:   Appropriate to Mood and Circumstances   Preoccupation:   None   Hallucinations:   None   Organization:  No data recorded  [employer] of Knowledge:   Average   Intelligence:   Average   Abstraction:   Normal   Judgement:   Poor   Reality Testing:   Adequate   Insight:   Denial; Lacking   Decision Making:   Impulsive   Social Functioning  Social Maturity:   Impulsive   Social Judgement:   Impropriety   Stress  Stressors:   Other (Comment) (Worsening depression)   Coping Ability:   Exhausted   Skill Deficits:   Decision making   Supports:   Family; Support needed     Religion: Religion/Spirituality Are You A Religious Person?:  (Not assessed) How Might This Affect Treatment?: Not assessed  Leisure/Recreation: Leisure / Recreation Do You Have Hobbies?: Yes Leisure and Hobbies: soccer, music  Exercise/Diet: Exercise/Diet Do You Exercise?: Yes What Type of Exercise Do You Do?: Run/Walk, Weight Training How Many Times a Week Do You Exercise?: Daily Have You Gained or Lost A Significant Amount of Weight in the Past Six Months?: No Do You Follow a Special Diet?: No Do You Have Any Trouble Sleeping?: No   CCA Employment/Education Employment/Work Situation: Employment / Work Situation Employment Situation: Employed Work Stressors: None Patient's Job has Been Impacted by Current Illness: No Has Patient ever Been in Equities trader?: No  Education: Education Is Patient Currently Attending School?: No Last Grade Completed: 12 Did You Product manager?: No Did You Have An Individualized Education Program (IIEP): No Did You  Have Any Difficulty At [employer]?: No Patient's Education Has Been Impacted by Current Illness: No   CCA Family/Childhood History Family and Relationship History: Family history Marital status: Single Does patient have children?: No  Childhood History:  Childhood History By whom was/is the patient raised?: Mother/father and step-parent Did patient suffer any verbal/emotional/physical/sexual abuse as a child?: No Did patient suffer from severe childhood neglect?: No Has patient ever been sexually abused/assaulted/raped as an adolescent or adult?: No Was the patient ever a victim of a crime or a disaster?: No Witnessed domestic violence?: No Has patient been affected by domestic violence as an adult?: No  Child/Adolescent Assessment:     CCA Substance Use Alcohol/Drug Use: Alcohol / Drug Use Pain Medications: See MAR Prescriptions: See MAR Over the Counter: See MAR History of alcohol / drug use?: Yes Longest period of sobriety (when/how long):  Unknown Negative Consequences of Use: Personal relationships, Legal Withdrawal Symptoms: None                         ASAM's:  Six Dimensions of Multidimensional Assessment  Dimension 1:  Acute Intoxication and/or Withdrawal Potential:   Dimension 1:  Description of individual's past and current experiences of substance use and withdrawal: Pt has a hx of polysubstance abuse  Dimension 2:  Biomedical Conditions and Complications:   Dimension 2:  Description of patient's biomedical conditions and  complications: Pt reported physical complications with his bladder due to Ketamine use  Dimension 3:  Emotional, Behavioral, or Cognitive Conditions and Complications:  Dimension 3:  Description of emotional, behavioral, or cognitive conditions and complications: Pt reported emotional lability and memory disturbance due to substance abuse  Dimension 4:  Readiness to Change:  Dimension 4:  Description of Readiness to Change criteria: Pt  is in denial about addictive behavior; precontemplation stage  Dimension 5:  Relapse, Continued use, or Continued Problem Potential:  Dimension 5:  Relapse, continued use, or continued problem potential critiera description: Pt reported being afraid to return to living environment due to probability of relapse.  Dimension 6:  Recovery/Living Environment:  Dimension 6:  Recovery/Iiving environment criteria description: Pt reported that he lives with friends who use.  ASAM Severity Score:    ASAM Recommended Level of Treatment: ASAM Recommended Level of Treatment: Level III Residential Treatment   Substance use Disorder (SUD) Substance Use Disorder (SUD)  Checklist Symptoms of Substance Use: Continued use despite persistent or recurrent social, interpersonal problems, caused or exacerbated by use, Continued use despite having a persistent/recurrent physical/psychological problem caused/exacerbated by use, Evidence of tolerance, Evidence of withdrawal (Comment), Large amounts of time spent to obtain, use or recover from the substance(s), Persistent desire or unsuccessful efforts to cut down or control use  Recommendations for Services/Supports/Treatments: Recommendations for Services/Supports/Treatments Recommendations For Services/Supports/Treatments: Inpatient Hospitalization  DSM5 Diagnoses: Patient Active Problem List   Diagnosis Date Noted   Substance induced mood disorder (HCC) 06/04/2021   Polysubstance abuse (HCC) 06/03/2021   Rona Tomson R Vera, LCAS

## 2021-10-03 NOTE — ED Triage Notes (Signed)
Pt arrives today with concerns for OD. Pt stating they did "molly" last night and arrives today after taking ketamine today, But unsure when. Pt stating they have poor memory.  Pt stating "I had a freak out". Pt showing me his arm that has many superficial cutting wounds. PT stating they have felt depressed and were suicidal.

## 2021-10-03 NOTE — ED Notes (Signed)
Pt refusing to dress out, Necklace, 2 earrings given to mom to take home.

## 2021-10-03 NOTE — Consult Note (Signed)
[facility] Face-to-Face Psychiatry Consult   Reason for Consult:  Psychiatric evaluation Referring Physician:  Dr. Derrill Kay Patient Identification: Angus Amini MRN:  [id number] Principal Diagnosis: Polysubstance abuse [facility]) Diagnosis:  Principal Problem:   Polysubstance abuse (HCC)   Total Time spent with patient: 45 minutes  Subjective:   " I can remember why I was brought here"  HPI: Antiono Ettinger, 21 y.o., male patient seen by this provider; chart reviewed and consulted with Dr. Derrill Kay on 10/03/21.  On evaluation Capers Hagmann reports that he used ketamine and molly this evening.  He says it made him feel great.  He reports seeing an online psychiatrist since being dc' d by [facility] and states that he is currently prescribed duloxetine 90 mg which makes him feel emotionless.  He states this state of lack of emotion, causes him to use "street drugs". He reports using "psychedelics" and reports he gets them off of the dark web.  He denies being addicted to one drug, but admits he's addicted to the usage of multiple drugs. Per triage nurse, pt arrives today with concerns for OD. Pt stating they did "molly" last night and arrives today after taking ketamine today, But unsure when. Pt stating they have poor memory.  Pt stating "I had a freak out". Pt showing me his arm that has many superficial cutting wounds. PT stating they have felt depressed and were suicidal.  Per TTS,  Pt presented to [facility] ED voluntarily and was later IVC's by the EDP. Upon assessment Pt explained that he'd presented to the ED with complaints of needing his medications adjusted as they are ineffective, as well as experiencing worsening SI. Pt reported that he'd used Ketamine on 10/02/21 which triggered his suicidal thoughts. Pt had memory disturbance and was unable to remember why he'd presented to the ED in the immediate, as well as 3 days ago. Pt had lacking insight and was in denial about the severity of his  substance abuse. Pt did not express a readiness for change. Pt admitted to experiencing memories that contribute to his suicidal thoughts that ultimately stem from his former LSD use. Pt reported that he relapsed due to his medications making him feel emotionless and wanting desperately to feel his emotions. Pt continues to engage in NSSIB and had multiple superficial cuts on his left forearm. Pt was cooperative and forthcoming throughout the assessment. Pt had clear and coherent speech. Pt was oriented x4. Pt had an anxious mood and a responsive affect. The pt. did not appear to be responding to internal/external stimuli nor did he present with any delusional thinking. The patient denied current SI, HI or AV/H.   Recommendation:  Inpatient psychiatric hospitalization   Past Psychiatric History: Polysubstance abuse  Risk to Self:  yes Risk to Others:  unknown Prior Inpatient Therapy:  yes Prior Outpatient Therapy:  yes  Past Medical History:  Past Medical History:  Diagnosis Date   Depression    No past surgical history on file. Family History:  Family History  Problem Relation Age of Onset   Healthy Mother    Healthy Father    Family Psychiatric  History: unknown Social History:  Social History   Substance and Sexual Activity  Alcohol Use Not Currently     Social History   Substance and Sexual Activity  Drug Use Yes   Types: Marijuana, IV   Comment: shrooms, MDMA, ketamine    Social History   Socioeconomic History   Marital status: Single  Spouse name: Not on file   Number of children: Not on file   Years of education: Not on file   Highest education level: Not on file  Occupational History   Not on file  Tobacco Use   Smoking status: Every Day    Types: Cigarettes   Smokeless tobacco: Current  Vaping Use   Vaping Use: Every day  Substance and Sexual Activity   Alcohol use: Not Currently   Drug use: Yes    Types: Marijuana, IV    Comment: shrooms, MDMA,  ketamine   Sexual activity: Yes  Other Topics Concern   Not on file  Social History Narrative   Not on file   Social Determinants of Health   Financial Resource Strain: Not on file  Food Insecurity: Not on file  Transportation Needs: Not on file  Physical Activity: Not on file  Stress: Not on file  Social Connections: Not on file   Additional Social History:    Allergies:  No Known Allergies  Labs:  Results for orders placed or performed during the hospital encounter of 10/03/21 (from the past 48 hour(s))  Comprehensive metabolic panel     Status: None   Collection Time: 10/03/21 10:02 PM  Result Value Ref Range   Sodium 135 135 - 145 mmol/L   Potassium 3.5 3.5 - 5.1 mmol/L   Chloride 104 98 - 111 mmol/L   CO2 23 22 - 32 mmol/L   Glucose, Bld 90 70 - 99 mg/dL    Comment: Glucose reference range applies only to samples taken after fasting for at least 8 hours.   BUN 9 6 - 20 mg/dL   Creatinine, Ser 3.22 0.61 - 1.24 mg/dL   Calcium 9.4 8.9 - 02.5 mg/dL   Total Protein 7.9 6.5 - 8.1 g/dL   Albumin 4.9 3.5 - 5.0 g/dL   AST 31 15 - 41 U/L   ALT 21 0 - 44 U/L   Alkaline Phosphatase 71 38 - 126 U/L   Total Bilirubin 1.0 0.3 - 1.2 mg/dL   GFR, Estimated >42 >70 mL/min    Comment: (NOTE) Calculated using the CKD-EPI Creatinine Equation (2021)    Anion gap 8 5 - 15    Comment: Performed at [facility], [address] [place], [place] [address]  CBC with Differential     Status: Abnormal   Collection Time: 10/03/21 10:02 PM  Result Value Ref Range   WBC 8.0 4.0 - 10.5 K/uL   RBC 5.31 4.22 - 5.81 MIL/uL   Hemoglobin 16.1 13.0 - 17.0 g/dL   HCT 28.3 15.1 - 76.1 %   MCV 82.5 80.0 - 100.0 fL   MCH 30.3 26.0 - 34.0 pg   MCHC 36.8 (H) 30.0 - 36.0 g/dL   RDW 60.7 37.1 - 06.2 %   Platelets 239 150 - 400 K/uL   nRBC 0.0 0.0 - 0.2 %   Neutrophils Relative % 54 %   Neutro Abs 4.4 1.7 - 7.7 K/uL   Lymphocytes Relative 34 %   Lymphs Abs 2.7 0.7 - 4.0 K/uL    Monocytes Relative 10 %   Monocytes Absolute 0.8 0.1 - 1.0 K/uL   Eosinophils Relative 1 %   Eosinophils Absolute 0.1 0.0 - 0.5 K/uL   Basophils Relative 1 %   Basophils Absolute 0.1 0.0 - 0.1 K/uL   Immature Granulocytes 0 %   Abs Immature Granulocytes 0.03 0.00 - 0.07 K/uL    Comment: Performed at [facility], [address] [place], [place] [address]  Ethanol     Status: None   Collection Time: 10/03/21 10:02 PM  Result Value Ref Range   Alcohol, Ethyl (B) <10 <10 mg/dL    Comment: (NOTE) Lowest detectable limit for serum alcohol is 10 mg/dL.  For medical purposes only. Performed at [facility], [address] [place], [place] [address]   Acetaminophen level     Status: Abnormal   Collection Time: 10/03/21 10:02 PM  Result Value Ref Range   Acetaminophen (Tylenol), Serum <10 (L) 10 - 30 ug/mL    Comment: (NOTE) Therapeutic concentrations vary significantly. A range of 10-30 ug/mL  may be an effective concentration for many patients. However, some  are best treated at concentrations outside of this range. Acetaminophen concentrations >150 ug/mL at 4 hours after ingestion  and >50 ug/mL at 12 hours after ingestion are often associated with  toxic reactions.  Performed at [facility], [address] [place], [place] [address]   Salicylate level     Status: Abnormal   Collection Time: 10/03/21 10:02 PM  Result Value Ref Range   Salicylate Lvl <7.0 (L) 7.0 - 30.0 mg/dL    Comment: Performed at [facility], [address] [place], [place] [address]    No current facility-administered medications for this encounter.   Current Outpatient Medications  Medication Sig Dispense Refill   ARIPiprazole (ABILIFY) 10 MG tablet Take 10 mg by mouth at bedtime.     DULoxetine (CYMBALTA) 30 MG capsule Take by mouth.     guanFACINE (INTUNIV) 1 MG TB24 ER tablet Take 1 mg by mouth every morning.     hydrOXYzine (ATARAX) 25 MG tablet  SMARTSIG:0.5-1 Tablet(s) By Mouth 3 Times a Week     hydrOXYzine (VISTARIL) 25 MG capsule Take 25 mg by mouth every 6 (six) hours as needed.     mirtazapine (REMERON) 15 MG tablet Take 15 mg by mouth at bedtime.      Musculoskeletal: Strength & Muscle Tone: within normal limits Gait & Station: normal Patient leans: N/A  Psychiatric Specialty Exam:  Presentation  General Appearance: Casual; Appropriate for Environment  Eye Contact:Fair  Speech:Clear and Coherent  Speech Volume:Decreased  Handedness:Right   Mood and Affect  Mood:Depressed; Dysphoric  Affect:Congruent   Thought Process  Thought Processes:Coherent  Descriptions of Associations:Intact  Orientation:Full (Time, Place and Person)  Thought Content:WDL  History of Schizophrenia/Schizoaffective disorder:No  Duration of Psychotic Symptoms:No data recorded Hallucinations:Hallucinations: None  Ideas of Reference:None  Suicidal Thoughts:Suicidal Thoughts: Yes, Active SI Active Intent and/or Plan: Without Plan; With Means to Carry Out  Homicidal Thoughts:Homicidal Thoughts: No   Sensorium  Memory:Immediate Poor; Remote Poor  Judgment:Impaired  Insight:Poor; Lacking   Executive Functions  Concentration:Good  Attention Span:Poor  Recall:Poor  Fund of Knowledge:Poor  Language:Poor   Psychomotor Activity  Psychomotor Activity:Psychomotor Activity: Normal   Assets  Assets:Housing; Vocational/Educational   Sleep  Sleep:Sleep: Poor   Physical Exam: Physical Exam Vitals and nursing note reviewed.  Constitutional:      Appearance: He is toxic-appearing.  HENT:     Head: Normocephalic and atraumatic.     Nose: Nose normal.  Eyes:     Extraocular Movements: Extraocular movements intact.     Conjunctiva/sclera: Conjunctivae normal.  Pulmonary:     Effort: Pulmonary effort is normal.  Musculoskeletal:        General: Normal range of motion.     Cervical back: Normal range of  motion.  Neurological:     Mental Status: He is alert and oriented to person, place, and time.  Psychiatric:        Attention and Perception: Attention normal.        Mood and Affect: Mood is anxious and depressed. Affect is flat.        Speech: Speech normal.        Behavior: Behavior is slowed and withdrawn. Behavior is cooperative.        Thought Content: Thought content includes suicidal ideation.        Cognition and Memory: Cognition is impaired. Memory is impaired.        Judgment: Judgment is impulsive and inappropriate.    Review of Systems  Psychiatric/Behavioral:  Positive for depression, memory loss, substance abuse and suicidal ideas. The patient has insomnia.   All other systems reviewed and are negative.  Blood pressure 128/90, pulse 88, temperature 98.4 F (36.9 C), resp. rate 12, weight 56.7 kg, SpO2 98 %. Body mass index is 22.14 kg/m.  Treatment Plan Summary: Daily contact with patient to assess and evaluate symptoms and progress in treatment and Medication management  Disposition: Recommend psychiatric Inpatient admission when medically cleared. Supportive therapy provided about ongoing stressors. Discussed crisis plan, support from social network, calling 911, coming to the Emergency Department, and calling Suicide Hotline.  Jearld Lesch, NP 10/03/2021 11:37 PM

## 2021-10-03 NOTE — ED Notes (Signed)
Patient reports he was using molly last pm and ketamine earlier today.  Reports he has had some thought of self harm.  Patient with multiple superficial lacerations to his left forearm, that patient states he did last night.

## 2021-10-03 NOTE — ED Provider Notes (Signed)
[facility] Provider Note    Event Date/Time   First MD Initiated Contact with Patient 10/03/21 2220     (approximate)   History   Drug Overdose and Psychiatric Evaluation   HPI {Remember to add pertinent medical, surgical, social, and/or OB history to HPI:1} Edward Shelton is a 21 y.o. male  ***       Physical Exam   Triage Vital Signs: ED Triage Vitals  Enc Vitals Group     BP 10/03/21 2204 128/90     Pulse Rate 10/03/21 2204 88     Resp 10/03/21 2204 12     Temp 10/03/21 2204 98.4 F (36.9 C)     Temp src --      SpO2 10/03/21 2204 98 %     Weight 10/03/21 2158 125 lb (56.7 kg)     Height --      Head Circumference --      Peak Flow --      Pain Score 10/03/21 2158 0     Pain Loc --      Pain Edu? --      Excl. in GC? --     Most recent vital signs: Vitals:   10/03/21 2204  BP: 128/90  Pulse: 88  Resp: 12  Temp: 98.4 F (36.9 C)  SpO2: 98%    {Only need to document appropriate and relevant physical exam:1} General: Awake, no distress. *** CV:  Good peripheral perfusion. *** Resp:  Normal effort. *** Abd:  No distention. *** Other:  ***   ED Results / Procedures / Treatments   Labs (all labs ordered are listed, but only abnormal results are displayed) Labs Reviewed  CBC WITH DIFFERENTIAL/PLATELET - Abnormal; Notable for the following components:      Result Value   MCHC 36.8 (*)    All other components within normal limits  COMPREHENSIVE METABOLIC PANEL  ETHANOL  ACETAMINOPHEN LEVEL  SALICYLATE LEVEL  URINE DRUG SCREEN, QUALITATIVE ([facility] ONLY)     EKG  ***   RADIOLOGY *** {USE THE WORD "INTERPRETED"!! You MUST document your own interpretation of imaging, as well as the fact that you reviewed the radiologist's report!:1}   PROCEDURES:  Critical Care performed: {CriticalCareYesNo:19197::"Yes, see critical care procedure note(s)","No"}  Procedures   MEDICATIONS ORDERED IN ED: Medications - No  data to display   IMPRESSION / MDM / ASSESSMENT AND PLAN / ED COURSE  I reviewed the triage vital signs and the nursing notes.                              Differential diagnosis includes, but is not limited to, ***  Patient's presentation is most consistent with {EM COPA:27473}  {If the patient is on the monitor, remove the brackets and asterisks on the sentence below and remember to document it as a Procedure as well. Otherwise delete the sentence below:1} {**The patient is on the cardiac monitor to evaluate for evidence of arrhythmia and/or significant heart rate changes.**} {Remember to include, when applicable, any/all of the following data: independent review of imaging independent review of labs (comment specifically on pertinent positives and negatives) review of specific prior hospitalizations, PCP/specialist notes, etc. discuss meds given and prescribed document any discussion with consultants (including hospitalists) any clinical decision tools you used and why (PECARN, NEXUS, etc.) did you consider admitting the patient? document social determinants of health affecting patient's care (homelessness, inability to follow  up in a timely fashion, etc) document any pre-existing conditions increasing risk on current visit (e.g. diabetes and HTN increasing danger of high-risk chest pain/ACS) describes what meds you gave (especially parenteral) and why any other interventions?:1}     FINAL CLINICAL IMPRESSION(S) / ED DIAGNOSES   Final diagnoses:  None     Rx / DC Orders   ED Discharge Orders     None        Note:  This document was prepared using Dragon voice recognition software and may include unintentional dictation errors.

## 2021-10-03 NOTE — ED Notes (Signed)
TTS and psych NP in with patient. 

## 2021-10-03 NOTE — ED Notes (Signed)
Patient dressed out into hospital approved scrubs.  Patient items placed into labeled belongings bag to be secured on the unit:    1 gray colored t-shirt   1 pair black colored shorts   1 pair black colored underpants   1 pair black colored socks   1 pair black colored tennis shoes   1 charger for ankle bracelet.

## 2021-10-04 LAB — SARS CORONAVIRUS 2 BY RT PCR: SARS Coronavirus 2 by RT PCR: NEGATIVE

## 2021-10-04 LAB — URINE DRUG SCREEN, QUALITATIVE (ARMC ONLY)
Amphetamines, Ur Screen: POSITIVE — AB
Barbiturates, Ur Screen: NOT DETECTED
Benzodiazepine, Ur Scrn: NOT DETECTED
Cannabinoid 50 Ng, Ur ~~LOC~~: POSITIVE — AB
Cocaine Metabolite,Ur ~~LOC~~: NOT DETECTED
MDMA (Ecstasy)Ur Screen: POSITIVE — AB
Methadone Scn, Ur: NOT DETECTED
Opiate, Ur Screen: NOT DETECTED
Phencyclidine (PCP) Ur S: NOT DETECTED
Tricyclic, Ur Screen: NOT DETECTED

## 2021-10-04 MED ORDER — GUANFACINE HCL ER 1 MG PO TB24
1.0000 mg | ORAL_TABLET | Freq: Every morning | ORAL | Status: DC
  Filled 2021-10-04: qty 1

## 2021-10-04 MED ORDER — ARIPIPRAZOLE 10 MG PO TABS: 10.0000 mg | ORAL_TABLET | Freq: Every day | ORAL | Status: DC

## 2021-10-04 NOTE — ED Notes (Signed)
Transport here at this time. Pt belongings given to transport. Pt notified.

## 2021-10-04 NOTE — BH Assessment (Signed)
Writer called and spoke with [facility] ([place]ason-[phone]) and informed them the patient was accepted to [facility].

## 2021-10-04 NOTE — BH Assessment (Signed)
Patient has been accepted to [facility].  Patient assigned to Edward Shelton Accepting physician is Dr. Dionicio Stall.  Call report to [phone].  Representative was Phlicia.   ER Staff is aware of it:  French Ana, ER Maida Sale, Patient's Nurse   Address: [address]  [place], [place] [address]

## 2021-10-04 NOTE — ED Notes (Signed)
[facility] will accept pt tomorrow under Dr Landry Mellow number for report is [phone]- pt will be going to main campus

## 2021-10-04 NOTE — ED Notes (Signed)
IVC/pending psych inpatient admission when medically cleared 

## 2021-10-04 NOTE — ED Notes (Addendum)
Pt given phone to call his mom, updated on plan of care and planned transfer to [facility]. Pt agreeable to plan.

## 2021-10-04 NOTE — ED Provider Notes (Signed)
Emergency Medicine Observation Re-evaluation Note  Edward Shelton is a 21 y.o. male, seen on rounds today.  Pt initially presented to the ED for complaints of Drug Overdose and Psychiatric Evaluation Currently, the patient is resting calmly.  Physical Exam  BP 128/90   Pulse 88   Temp 98.4 F (36.9 C)   Resp 12   Wt 56.7 kg   SpO2 98%   BMI 22.14 kg/m  Physical Exam General: no acute distress Psych: calm  ED Course / MDM  EKG:   I have reviewed the labs performed to date as well as medications administered while in observation.  Recent changes in the last 24 hours include none.  Plan  Current plan is for inpatient. [facility] is under involuntary commitment.      Gilles Chiquito, MD 10/04/21 [phone]

## 2021-10-04 NOTE — BH Assessment (Signed)
Referral information for Psychiatric Hospitalization faxed to:  [facility] ([phone]-or- [phone]),   [facility] ([phone]),  [facility] ([phone]),   [facility] ([phone] -or- [phone]),   [facility] ([phone]).  [facility] ([phone])  

## 2021-10-04 NOTE — ED Notes (Signed)
Pt's mom dropped off a red and white basket of belongings for pt- basket was labeled with a green pt belongings label with pt sticker on it

## 2021-10-22 ENCOUNTER — Emergency Department
Admission: EM | Admit: 2021-10-22 | Discharge: 2021-10-23 | Disposition: A | Discharge status: Home / Self Care | Payer: Medicaid Other | Attending: Emergency Medicine | Admitting: Emergency Medicine

## 2021-10-22 ENCOUNTER — Emergency Department: Payer: Medicaid Other

## 2021-10-22 DIAGNOSIS — T43624A Poisoning by amphetamines, undetermined, initial encounter: Secondary | ICD-10-CM | POA: Diagnosis not present

## 2021-10-22 DIAGNOSIS — R4182 Altered mental status, unspecified: Secondary | ICD-10-CM | POA: Insufficient documentation

## 2021-10-22 DIAGNOSIS — F32A Depression, unspecified: Secondary | ICD-10-CM

## 2021-10-22 DIAGNOSIS — T40724A Poisoning by synthetic cannabinoids, undetermined, initial encounter: Secondary | ICD-10-CM | POA: Insufficient documentation

## 2021-10-22 DIAGNOSIS — F1910 Other psychoactive substance abuse, uncomplicated: Secondary | ICD-10-CM | POA: Diagnosis present

## 2021-10-22 DIAGNOSIS — T50904A Poisoning by unspecified drugs, medicaments and biological substances, undetermined, initial encounter: Secondary | ICD-10-CM

## 2021-10-22 DIAGNOSIS — R55 Syncope and collapse: Secondary | ICD-10-CM | POA: Diagnosis not present

## 2021-10-22 DIAGNOSIS — Z20822 Contact with and (suspected) exposure to covid-19: Secondary | ICD-10-CM | POA: Diagnosis not present

## 2021-10-22 LAB — CBC WITH DIFFERENTIAL/PLATELET
Abs Immature Granulocytes: 0.03 10*3/uL (ref 0.00–0.07)
Basophils Absolute: 0.1 10*3/uL (ref 0.0–0.1)
Basophils Relative: 1 %
Eosinophils Absolute: 0 10*3/uL (ref 0.0–0.5)
Eosinophils Relative: 0 %
HCT: 46.9 % (ref 39.0–52.0)
Hemoglobin: 16.6 g/dL (ref 13.0–17.0)
Immature Granulocytes: 0 %
Lymphocytes Relative: 17 %
Lymphs Abs: 1.6 10*3/uL (ref 0.7–4.0)
MCH: 30 pg (ref 26.0–34.0)
MCHC: 35.4 g/dL (ref 30.0–36.0)
MCV: 84.7 fL (ref 80.0–100.0)
Monocytes Absolute: 0.7 10*3/uL (ref 0.1–1.0)
Monocytes Relative: 8 %
Neutro Abs: 7.2 10*3/uL (ref 1.7–7.7)
Neutrophils Relative %: 74 %
Platelets: 197 10*3/uL (ref 150–400)
RBC: 5.54 MIL/uL (ref 4.22–5.81)
RDW: 12.8 % (ref 11.5–15.5)
WBC: 9.7 10*3/uL (ref 4.0–10.5)
nRBC: 0 % (ref 0.0–0.2)

## 2021-10-22 LAB — URINE DRUG SCREEN, QUALITATIVE (ARMC ONLY)
Amphetamines, Ur Screen: POSITIVE — AB
Barbiturates, Ur Screen: NOT DETECTED
Benzodiazepine, Ur Scrn: NOT DETECTED
Cannabinoid 50 Ng, Ur ~~LOC~~: POSITIVE — AB
Cocaine Metabolite,Ur ~~LOC~~: NOT DETECTED
MDMA (Ecstasy)Ur Screen: POSITIVE — AB
Methadone Scn, Ur: NOT DETECTED
Opiate, Ur Screen: NOT DETECTED
Phencyclidine (PCP) Ur S: NOT DETECTED
Tricyclic, Ur Screen: NOT DETECTED

## 2021-10-22 LAB — SALICYLATE LEVEL: Salicylate Lvl: <7 mg/dL — ABNORMAL LOW (ref 7.0–30.0)

## 2021-10-22 LAB — COMPREHENSIVE METABOLIC PANEL
ALT: 18 U/L (ref 0–44)
AST: 17 U/L (ref 15–41)
Albumin: 5.2 g/dL — ABNORMAL HIGH (ref 3.5–5.0)
Alkaline Phosphatase: 74 U/L (ref 38–126)
Anion gap: 12 (ref 5–15)
BUN: 10 mg/dL (ref 6–20)
CO2: 24 mmol/L (ref 22–32)
Calcium: 9.7 mg/dL (ref 8.9–10.3)
Chloride: 101 mmol/L (ref 98–111)
Creatinine, Ser: 0.82 mg/dL (ref 0.61–1.24)
GFR, Estimated: >60 mL/min (ref >60)
Glucose, Bld: 92 mg/dL (ref 70–99)
Potassium: 3.7 mmol/L (ref 3.5–5.1)
Sodium: 137 mmol/L (ref 135–145)
Total Bilirubin: 2.1 mg/dL — ABNORMAL HIGH (ref 0.3–1.2)
Total Protein: 8.4 g/dL — ABNORMAL HIGH (ref 6.5–8.1)

## 2021-10-22 LAB — RESP PANEL BY RT-PCR (FLU A&B, COVID) ARPGX2
Influenza A by PCR: NEGATIVE
Influenza B by PCR: NEGATIVE
SARS Coronavirus 2 by RT PCR: NEGATIVE

## 2021-10-22 LAB — ETHANOL: Alcohol, Ethyl (B): <10 mg/dL (ref <10)

## 2021-10-22 LAB — ACETAMINOPHEN LEVEL: Acetaminophen (Tylenol), Serum: <10 ug/mL — ABNORMAL LOW (ref 10–30)

## 2021-10-22 MED ORDER — LORAZEPAM 2 MG/ML IJ SOLN
2.0000 mg | Freq: Once | INTRAMUSCULAR | Status: AC
  Administered 2021-10-22: 2 mg via INTRAMUSCULAR
  Filled 2021-10-22: qty 1

## 2021-10-22 MED ORDER — OLANZAPINE 2.5 MG PO TABS
2.5000 mg | ORAL_TABLET | Freq: Every day | ORAL | Status: DC
  Administered 2021-10-22: 2.5 mg via ORAL
  Filled 2021-10-22: qty 1

## 2021-10-22 MED ORDER — HALOPERIDOL LACTATE 5 MG/ML IJ SOLN
5.0000 mg | Freq: Once | INTRAMUSCULAR | Status: AC
Start: 2021-10-22 — End: 2021-10-22
  Administered 2021-10-22: 5 mg via INTRAMUSCULAR
  Filled 2021-10-22: qty 1

## 2021-10-22 MED ORDER — HYDROXYZINE HCL 25 MG PO TABS
25.0000 mg | ORAL_TABLET | Freq: Four times a day (QID) | ORAL | Status: DC | PRN
  Administered 2021-10-22: 25 mg via ORAL
  Filled 2021-10-22: qty 1

## 2021-10-22 MED ORDER — MIRTAZAPINE 15 MG PO TABS
15.0000 mg | ORAL_TABLET | Freq: Every day | ORAL | Status: DC
  Administered 2021-10-22: 15 mg via ORAL
  Filled 2021-10-22: qty 1

## 2021-10-22 MED ORDER — TRAZODONE HCL 100 MG PO TABS
50.0000 mg | ORAL_TABLET | Freq: Every day | ORAL | Status: DC
  Administered 2021-10-22: 50 mg via ORAL
  Filled 2021-10-22: qty 1

## 2021-10-22 MED ORDER — VENLAFAXINE HCL 37.5 MG PO TABS
37.5000 mg | ORAL_TABLET | Freq: Every day | ORAL | Status: DC
  Administered 2021-10-23: 37.5 mg via ORAL
  Filled 2021-10-22 (×2): qty 1

## 2021-10-22 NOTE — ED Notes (Signed)
Received report from Katelyn RN.

## 2021-10-22 NOTE — ED Provider Notes (Addendum)
[facility] Provider Note    Event Date/Time   First MD Initiated Contact with Patient 10/22/21 1802     (approximate)   History   Loss of Consciousness (Patient brought in by parents in POV after being found unresponsive; Patient does wake to painful stimuli and is maintaining patent airway; Patient does communicate with RN during triage and nods that he did ingest/take a substance but does not say what)   HPI  Edward Shelton is a 21 y.o. male with a past medical history of depression and previous suicidal thoughts as well as polysubstance abuse including LSD and ketamine as well as recent hospitalization at [facility] who presents coming by parents for evaluation of altered mental status concern for drug overdose.  Patient's mother states she picked him up from his friend's house he seemed like it overdose.  On arrival patient is able to state his name only but otherwise unable provide any additional history.  His mother states that he frequently will use drugs and thinks he may have used some ketamine but is not 100% sure.  She is worried about his mental state and is not sure if he is feeling suicidal or not.  No other history is immediately available on presentation.   Past Medical History:  Diagnosis Date   Depression      Physical Exam  Triage Vital Signs: ED Triage Vitals  Enc Vitals Group     BP      Pulse      Resp      Temp      Temp src      SpO2      Weight      Height      Head Circumference      Peak Flow      Pain Score      Pain Loc      Pain Edu?      Excl. in [place]?     Most recent vital signs: Vitals:   10/22/21 1840 10/22/21 1841  BP: (!) 128/95   Pulse: 85   Resp: 14   Temp:  97.9 F (36.6 C)  SpO2: 99%     General: Patient is very sleepy and ill-appearing.  He is able to state his name but otherwise does not answer any direct questions CV:  Good peripheral perfusion.  2+ bilateral radial pulses. Resp:  Normal  effort.  Clear bilaterally. Abd:  No distention.  Soft. Other:  Eyes are bloodshot but pupils are reactive.  Patient withdraws all extremities to noxious stimuli.  No obvious trauma to the face scalp torso or extremities.   ED Results / Procedures / Treatments  Labs (all labs ordered are listed, but only abnormal results are displayed) Labs Reviewed  COMPREHENSIVE METABOLIC PANEL - Abnormal; Notable for the following components:      Result Value   Total Protein 8.4 (*)    Albumin 5.2 (*)    Total Bilirubin 2.1 (*)    All other components within normal limits  URINE DRUG SCREEN, QUALITATIVE ([facility] ONLY) - Abnormal; Notable for the following components:   Amphetamines, Ur Screen POSITIVE (*)    MDMA (Ecstasy)Ur Screen POSITIVE (*)    Cannabinoid 50 Ng, Ur [place] POSITIVE (*)    All other components within normal limits  ACETAMINOPHEN LEVEL - Abnormal; Notable for the following components:   Acetaminophen (Tylenol), Serum <10 (*)    All other components within normal limits  SALICYLATE  LEVEL - Abnormal; Notable for the following components:   Salicylate Lvl <0.1 (*)    All other components within normal limits  RESP PANEL BY RT-PCR (FLU A&B, COVID) ARPGX2  ETHANOL  CBC WITH DIFFERENTIAL/PLATELET     EKG  EKG is remarkable for sinus rhythm with ventricular rate of 87, normal axis, unremarkable intervals without clear evidence of acute ischemia.   RADIOLOGY  CT head without evidence of hemorrhage, ischemia, edema, mass effect or other acute intracranial process.  I also reviewed radiology's interpretation.   PROCEDURES:  Critical Care performed: No  .1-3 Lead EKG Interpretation  Performed by: Lucrezia Starch, MD Authorized by: Lucrezia Starch, MD     Interpretation: normal     ECG rate assessment: normal     Rhythm: sinus rhythm     Ectopy: none     Conduction: normal     The patient is on the cardiac monitor to evaluate for evidence of arrhythmia and/or significant  heart rate changes.   MEDICATIONS ORDERED IN ED: Medications  OLANZapine (ZYPREXA) tablet 2.5 mg (has no administration in time range)  traZODone (DESYREL) tablet 50 mg (has no administration in time range)  mirtazapine (REMERON) tablet 15 mg (has no administration in time range)  hydrOXYzine (VISTARIL) capsule 25 mg (has no administration in time range)  venlafaxine (EFFEXOR) tablet 37.5 mg (has no administration in time range)     IMPRESSION / MDM / ASSESSMENT AND PLAN / ED COURSE  I reviewed the triage vital signs and the nursing notes. Patient's presentation is most consistent with acute presentation with potential threat to life or bodily function.                               Differential diagnosis includes, but is not limited to intentional versus accidental drug overdose, SAH and significant metabolic derangement.  EKG is remarkable for sinus rhythm with ventricular rate of 87, normal axis, unremarkable intervals without clear evidence of acute ischemia.  CT head without evidence of hemorrhage, ischemia, edema, mass effect or other acute intracranial process.  I also reviewed radiology's interpretation.  CMP remarkable for a T. bili of 2.1 without any other significant derangements.  Normal LFTs and alk phos.  Serum ethanol, acetaminophen and salicylates undetectable.  CBC shows no cytosis or acute anemia.  UDS is positive for amphetamines, MDMA and cannabis.  On serial reassessments patient has steady improvement in his mental status.  He is able to eventually speak in complete sentences states he took ketamine earlier today.  He endorses depression and suicidal thoughts but states he was not actually trying to kill himself today.  States he is taking his medications for depression but is not sure if they are helping.  We will maintain patient voluntarily at this time but consult psychiatry TTS.  At this point I think patient is medically cleared.  He is denying any intent to  harm himself today he is endorsing depression and suicidal thoughts.  In addition his parents are very worried about his depression and suicidality.  I will IVC until he can be evaluated by psychiatry.  Patient did require some sedation medicine as he is repeatedly attempting to leave and fight security.  The patient has been placed in psychiatric observation due to the need to provide a safe environment for the patient while obtaining psychiatric consultation and evaluation, as well as ongoing medical and medication management to treat the patient's  condition.  The patient has been placed under full IVC at this time.       FINAL CLINICAL IMPRESSION(S) / ED DIAGNOSES   Final diagnoses:  Drug overdose of undetermined intent, initial encounter  Depression, unspecified depression type     Rx / DC Orders   ED Discharge Orders     None        Note:  This document was prepared using Dragon voice recognition software and may include unintentional dictation errors.   Lucrezia Starch, MD 10/22/21 2016    Lucrezia Starch, MD 10/22/21 2105

## 2021-10-22 NOTE — ED Notes (Signed)
Pt's parents were leaving for the night, pt wanted to leave with his parents, pt was made aware that he cannot leave until he speak to psych, security called, pt was medicated and placed in rm 5.

## 2021-10-22 NOTE — BH Assessment (Signed)
Attempted to assess patient with Psyc NP but patient is currently unable to participate in assessment due to substances used prior.

## 2021-10-22 NOTE — ED Notes (Signed)
Pt dressed out in hospital provided attire Pt belongings include:  Red shirt Gold chain Earrings Plaid underwear Grey shorts

## 2021-10-22 NOTE — ED Notes (Signed)
Patient transferred from ED to [facility] room 7 after screening for contraband. Report received from [facility] including Situation, Background, Assessment and Recommendations. Pt oriented to unit including Q15 minute rounds as well as the security cameras for their protection. Patient is alert and oriented, warm and dry in no acute distress, and sleepy/sluggish after receiving medications. Patient denies SI, HI, and AVH. Pt. Encouraged to let this nurse know if needs arise.

## 2021-10-23 DIAGNOSIS — F1910 Other psychoactive substance abuse, uncomplicated: Secondary | ICD-10-CM

## 2021-10-23 NOTE — Discharge Instructions (Signed)

## 2021-10-23 NOTE — ED Notes (Signed)
IVC PAPERS  RESCINDED PER  DR  CLAPACS  MD INFORMED  DOROTHY  RN

## 2021-10-23 NOTE — BH Assessment (Signed)
Comprehensive Clinical Assessment (CCA) Note  10/23/2021 Edward Shelton [id number]  Chief Complaint:  Chief Complaint  Patient presents with   Loss of Consciousness    Patient brought in by parents in POV after being found unresponsive; Patient does wake to painful stimuli and is maintaining patent airway; Patient does communicate with RN during triage and nods that he did ingest/take a substance but does not say what   Visit Diagnosis: Polysubstance abuse   [name] is a 21 year old male who presents to the ER because of the use of mind-altering substances. He admits to the use of LSD and Ketamine and his UDS was positive for Amphetamines, MDMA and Cannabis. During the interview, the patient was calm, cooperative and pleasant. He provide appropriate answers to the questions. Throughout the interview, he denied SI/HI and AV/H.  CCA Screening, Triage and Referral (STR)  Patient Reported Information How did you hear about [place]? Family/Friend  What Is the Reason for Your Visit/Call Today? Place under IVC due to his substance use and high risk behaviors.  How Long Has This Been Causing You Problems? > than 6 months  What Do You Feel Would Help You the Most Today? Alcohol or Drug Use Treatment   Have You Recently Had Any Thoughts About Hurting Yourself? No  Are You Planning to Commit Suicide/Harm Yourself At This time? No   Have you Recently Had Thoughts About Hurting Someone [name]? No  Are You Planning to Harm Someone at This Time? No  Explanation: No data recorded  Have You Used Any Alcohol or Drugs in the Past 24 Hours? Yes  How Long Ago Did You Use Drugs or Alcohol? No data recorded What Did You Use and How Much? LSD, Ketamine, Amphetamines, MDMA and Cannabis. 10/22/2021   Do You Currently Have a Therapist/Psychiatrist? No  Name of Therapist/Psychiatrist: Pt unable to recall   Have You Been Recently Discharged From Any Office Practice or Programs?  No  Explanation of Discharge From Practice/Program: No data recorded    CCA Screening Triage Referral Assessment Type of Contact: Face-to-Face  Telemedicine Service Delivery:   Is this Initial or Reassessment? No data recorded Date Telepsych consult ordered in [facility]:  No data recorded Time Telepsych consult ordered in [facility]:  No data recorded Location of Assessment: [facility] ED  Provider Location: [facility] ED   Collateral Involvement: None provided   Does Patient Have a Court Appointed Legal Guardian? No data recorded Name and Contact of Legal Guardian: No data recorded If Minor and Not Living with Parent(s), Who has Custody? n/a  Is CPS involved or ever been involved? Never  Is APS involved or ever been involved? Never   Patient Determined To Be At Risk for Harm To Self or Others Based on Review of Patient Reported Information or Presenting Complaint? No  Method: No data recorded Availability of Means: No data recorded Intent: No data recorded Notification Required: No data recorded Additional Information for Danger to Others Potential: No data recorded Additional Comments for Danger to Others Potential: No data recorded Are There Guns or Other Weapons in Your Home? No data recorded Types of Guns/Weapons: No data recorded Are These Weapons Safely Secured?                            No data recorded Who Could Verify You Are Able To Have These Secured: No data recorded Do You Have any Outstanding Charges, Pending Court Dates, Parole/Probation?  No data recorded Contacted To Inform of Risk of Harm To Self or Others: Other: Comment    Does Patient Present under Involuntary Commitment? Yes  IVC Papers Initial File Date: 10/22/21   [place] of Residence: [place]   Patient Currently Receiving the Following Services: Not Receiving Services   Determination of Need: Emergent (2 hours)   Options For Referral: ED Visit     CCA Biopsychosocial Patient Reported  Schizophrenia/Schizoaffective Diagnosis in Past: No   Strengths: Have support system, patient is polite and have some insight.   Mental Health Symptoms Depression:   Change in energy/activity; Difficulty Concentrating   Duration of Depressive symptoms:  Duration of Depressive Symptoms: Greater than two weeks   Mania:   None   Anxiety:    Irritability; Restlessness   Psychosis:   None   Duration of Psychotic symptoms:    Trauma:   N/A   Obsessions:   N/A   Compulsions:   N/A   Inattention:   None   Hyperactivity/Impulsivity:   N/A   Oppositional/Defiant Behaviors:   N/A   Emotional Irregularity:   N/A   Other Mood/Personality Symptoms:  No data recorded   Mental Status Exam Appearance and self-care  Stature:   Average   Weight:   Average weight   Clothing:   Neat/clean; Age-appropriate   Grooming:   Normal   Cosmetic use:   None   Posture/gait:   Normal   Motor activity:   -- (Within normal range)   Sensorium  Attention:   Normal   Concentration:   Normal   Orientation:   X5   Recall/memory:   Normal   Affect and Mood  Affect:   Appropriate; Full Range   Mood:   Anxious   Relating  Eye contact:   None   Facial expression:   Responsive   Attitude toward examiner:   Cooperative   Thought and Language  Speech flow:  Clear and Coherent; Normal   Thought content:   Appropriate to Mood and Circumstances   Preoccupation:   None   Hallucinations:   None   Organization:  No data recorded  [employer] of Knowledge:   Fair   Intelligence:   Average   Abstraction:   Functional   Judgement:   Impaired   Reality Testing:   Adequate   Insight:   Poor   Decision Making:   Impulsive   Social Functioning  Social Maturity:   Isolates   Social Judgement:   "Games developer"; Heedless   Stress  Stressors:   Other (Comment); Transitions   Coping Ability:   Exhausted   Skill  Deficits:   None   Supports:   Family; Friends/Service system     Religion: Religion/Spirituality Are You A Religious Person?: No  Leisure/Recreation: Leisure / Recreation Do You Have Hobbies?: No  Exercise/Diet: Exercise/Diet Do You Exercise?: No Do You Follow a Special Diet?: No Do You Have Any Trouble Sleeping?: No   CCA Employment/Education Employment/Work Situation: Employment / Work Situation Employment Situation: Unemployed Patient's Job has Been Impacted by Current Illness: No Has Patient ever Been in Passenger transport manager?: No  Education: Education Is Patient Currently Attending School?: No Did You Have An Individualized Education Program (IIEP): No Did You Have Any Difficulty At [employer]?: No Patient's Education Has Been Impacted by Current Illness: No   CCA Family/Childhood History Family and Relationship History: Family history Marital status: Single Does patient have children?: No  Childhood History:  Childhood History  By whom was/is the patient raised?: Mother Did patient suffer any verbal/emotional/physical/sexual abuse as a child?: No Did patient suffer from severe childhood neglect?: No Has patient ever been sexually abused/assaulted/raped as an adolescent or adult?: No Was the patient ever a victim of a crime or a disaster?: No Witnessed domestic violence?: No Has patient been affected by domestic violence as an adult?: No  Child/Adolescent Assessment:     CCA Substance Use Alcohol/Drug Use: Alcohol / Drug Use Pain Medications: See PTA Prescriptions: See PTA Over the Counter: See PTA History of alcohol / drug use?: Yes Longest period of sobriety (when/how long): Unable to quantify Substance #1 Name of Substance 1: LSD 1 - Last Use / Amount: 10/22/2021 Substance #2 Name of Substance 2: Ketamine 2 - Last Use / Amount: 10/22/2021 Substance #3 Name of Substance 3: Cannabis 3 - Last Use / Amount: 10/23/2021                    ASAM's:  Six Dimensions of Multidimensional Assessment  Dimension 1:  Acute Intoxication and/or Withdrawal Potential:      Dimension 2:  Biomedical Conditions and Complications:      Dimension 3:  Emotional, Behavioral, or Cognitive Conditions and Complications:     Dimension 4:  Readiness to Change:     Dimension 5:  Relapse, Continued use, or Continued Problem Potential:     Dimension 6:  Recovery/Living Environment:     ASAM Severity Score:    ASAM Recommended Level of Treatment:     Substance use Disorder (SUD)    Recommendations for Services/Supports/Treatments:    Discharge Disposition:    DSM5 Diagnoses: Patient Active Problem List   Diagnosis Date Noted   Suicide and self-inflicted injury (HCC)    Substance induced mood disorder (HCC) 06/04/2021   Polysubstance abuse (HCC) 06/03/2021     Referrals to Alternative Service(s): Referred to Alternative Service(s):   Place:   Date:   Time:    Referred to Alternative Service(s):   Place:   Date:   Time:    Referred to Alternative Service(s):   Place:   Date:   Time:    Referred to Alternative Service(s):   Place:   Date:   Time:     Lilyan Gilford MS, LCAS, [facility], [facility] Therapeutic Triage Specialist 10/23/2021 5:05 PM

## 2021-10-23 NOTE — Consult Note (Signed)
[facility] Face-to-Face Psychiatry Consult   Reason for Consult: Consult for patient under IVC after being brought in unconscious from substance use Referring Physician: Quale Patient Identification: Edward Shelton MRN:  [id number] Principal Diagnosis: Polysubstance abuse (HCC) Diagnosis:  Principal Problem:   Polysubstance abuse (HCC)   Total Time spent with patient: 45 minutes  Subjective:   Edward Shelton is a 21 y.o. male patient admitted with "I took some drugs".  HPI: Patient seen and chart reviewed.  Patient known from previous encounters.  21 year old with a history of substance use problems brought to the hospital after family found him unresponsive.  Despite being unresponsive he did not require intubation and is now awake and conversant.  Patient has only vague memories of coming to the hospital but knows his parents brought him in.  Patient says he took a bunch of drugs but does not know what they were.  He denies any wish to harm himself or wish to die.  Currently denies any suicidal thoughts.  Denies being depressed denies psychotic symptoms.  Patient states that he has no interest in getting involved in substance abuse treatment services.  Past Psychiatric History: Patient has a history of prior presentations to the emergency room and prior admissions under similar circumstances.  History of abusing multiple substances in a impulsive and dangerous manner.  Has made suicidal statements in the past but then quickly retracted them.  Has not been compliant with recommended outpatient treatment and has not shown motivation to be involved in substance use treatment  Risk to Self:   Risk to Others:   Prior Inpatient Therapy:   Prior Outpatient Therapy:    Past Medical History:  Past Medical History:  Diagnosis Date   Depression    No past surgical history on file. Family History:  Family History  Problem Relation Age of Onset   Healthy Mother    Healthy Father    Family  Psychiatric  History: None reported Social History:  Social History   Substance and Sexual Activity  Alcohol Use Not Currently     Social History   Substance and Sexual Activity  Drug Use Yes   Types: Marijuana, IV   Comment: shrooms, MDMA, ketamine    Social History   Socioeconomic History   Marital status: Single    Spouse name: Not on file   Number of children: Not on file   Years of education: Not on file   Highest education level: Not on file  Occupational History   Not on file  Tobacco Use   Smoking status: Every Day    Types: Cigarettes   Smokeless tobacco: Current  Vaping Use   Vaping Use: Every day  Substance and Sexual Activity   Alcohol use: Not Currently   Drug use: Yes    Types: Marijuana, IV    Comment: shrooms, MDMA, ketamine   Sexual activity: Yes  Other Topics Concern   Not on file  Social History Narrative   Not on file   Social Determinants of Health   Financial Resource Strain: Not on file  Food Insecurity: Not on file  Transportation Needs: Not on file  Physical Activity: Not on file  Stress: Not on file  Social Connections: Not on file   Additional Social History:    Allergies:  No Known Allergies  Labs:  Results for orders placed or performed during the hospital encounter of 10/22/21 (from the past 48 hour(s))  Comprehensive metabolic panel     Status: Abnormal  Collection Time: 10/22/21  6:04 PM  Result Value Ref Range   Sodium 137 135 - 145 mmol/L   Potassium 3.7 3.5 - 5.1 mmol/L   Chloride 101 98 - 111 mmol/L   CO2 24 22 - 32 mmol/L   Glucose, Bld 92 70 - 99 mg/dL    Comment: Glucose reference range applies only to samples taken after fasting for at least 8 hours.   BUN 10 6 - 20 mg/dL   Creatinine, Ser 5.18 0.61 - 1.24 mg/dL   Calcium 9.7 8.9 - 84.1 mg/dL   Total Protein 8.4 (H) 6.5 - 8.1 g/dL   Albumin 5.2 (H) 3.5 - 5.0 g/dL   AST 17 15 - 41 U/L   ALT 18 0 - 44 U/L   Alkaline Phosphatase 74 38 - 126 U/L   Total  Bilirubin 2.1 (H) 0.3 - 1.2 mg/dL   GFR, Estimated >66 >06 mL/min    Comment: (NOTE) Calculated using the CKD-EPI Creatinine Equation (2021)    Anion gap 12 5 - 15    Comment: Performed at [facility], [address] [place], [place] [address]  Ethanol     Status: None   Collection Time: 10/22/21  6:04 PM  Result Value Ref Range   Alcohol, Ethyl (B) <10 <10 mg/dL    Comment: (NOTE) Lowest detectable limit for serum alcohol is 10 mg/dL.  For medical purposes only. Performed at [facility], [address] [place], [place] [address]   CBC with Diff     Status: None   Collection Time: 10/22/21  6:04 PM  Result Value Ref Range   WBC 9.7 4.0 - 10.5 K/uL   RBC 5.54 4.22 - 5.81 MIL/uL   Hemoglobin 16.6 13.0 - 17.0 g/dL   HCT 35.5 73.2 - 20.2 %   MCV 84.7 80.0 - 100.0 fL   MCH 30.0 26.0 - 34.0 pg   MCHC 35.4 30.0 - 36.0 g/dL   RDW 54.2 70.6 - 23.7 %   Platelets 197 150 - 400 K/uL   nRBC 0.0 0.0 - 0.2 %   Neutrophils Relative % 74 %   Neutro Abs 7.2 1.7 - 7.7 K/uL   Lymphocytes Relative 17 %   Lymphs Abs 1.6 0.7 - 4.0 K/uL   Monocytes Relative 8 %   Monocytes Absolute 0.7 0.1 - 1.0 K/uL   Eosinophils Relative 0 %   Eosinophils Absolute 0.0 0.0 - 0.5 K/uL   Basophils Relative 1 %   Basophils Absolute 0.1 0.0 - 0.1 K/uL   Immature Granulocytes 0 %   Abs Immature Granulocytes 0.03 0.00 - 0.07 K/uL    Comment: Performed at [facility], [address] [place], [place] [address]  Acetaminophen level     Status: Abnormal   Collection Time: 10/22/21  6:04 PM  Result Value Ref Range   Acetaminophen (Tylenol), Serum <10 (L) 10 - 30 ug/mL    Comment: (NOTE) Therapeutic concentrations vary significantly. A range of 10-30 ug/mL  may be an effective concentration for many patients. However, some  are best treated at concentrations outside of this range. Acetaminophen concentrations >150 ug/mL at 4 hours after ingestion  and >50 ug/mL at 12 hours after  ingestion are often associated with  toxic reactions.  Performed at [facility], [address], [place], [place] [address]   Salicylate level     Status: Abnormal   Collection Time: 10/22/21  6:04 PM  Result Value Ref Range   Salicylate  Lvl <7.0 (L) 7.0 - 30.0 mg/dL    Comment: Performed at [facility], [address] [place], [place] [address]  Resp Panel by RT-PCR (Flu A&B, Covid) Anterior Nasal Swab     Status: None   Collection Time: 10/22/21  6:42 PM   Specimen: Anterior Nasal Swab  Result Value Ref Range   SARS Coronavirus 2 by RT PCR NEGATIVE NEGATIVE    Comment: (NOTE) SARS-CoV-2 target nucleic acids are NOT DETECTED.  The SARS-CoV-2 RNA is generally detectable in upper respiratory specimens during the acute phase of infection. The lowest concentration of SARS-CoV-2 viral copies this assay can detect is 138 copies/mL. A negative result does not preclude SARS-Cov-2 infection and should not be used as the sole basis for treatment or other patient management decisions. A negative result may occur with  improper specimen collection/handling, submission of specimen other than nasopharyngeal swab, presence of viral mutation(s) within the areas targeted by this assay, and inadequate number of viral copies(<138 copies/mL). A negative result must be combined with clinical observations, patient history, and epidemiological information. The expected result is Negative.  Fact Sheet for Patients:  BloggerCourse.com  Fact Sheet for Healthcare Providers:  SeriousBroker.it  This test is no t yet approved or cleared by the [place] FDA and  has been authorized for detection and/or diagnosis of SARS-CoV-2 by FDA under an Emergency Use Authorization (EUA). This EUA will remain  in effect (meaning this test can be used) for the duration of the COVID-19 declaration under Section 564(b)(1) of the Act,  21 U.S.C.section 360bbb-3(b)(1), unless the authorization is terminated  or revoked sooner.       Influenza A by PCR NEGATIVE NEGATIVE   Influenza B by PCR NEGATIVE NEGATIVE    Comment: (NOTE) The Xpert Xpress SARS-CoV-2/FLU/RSV plus assay is intended as an aid in the diagnosis of influenza from Nasopharyngeal swab specimens and should not be used as a sole basis for treatment. Nasal washings and aspirates are unacceptable for Xpert Xpress SARS-CoV-2/FLU/RSV testing.  Fact Sheet for Patients: BloggerCourse.com  Fact Sheet for Healthcare Providers: SeriousBroker.it  This test is not yet approved or cleared by the [place] FDA and has been authorized for detection and/or diagnosis of SARS-CoV-2 by FDA under an Emergency Use Authorization (EUA). This EUA will remain in effect (meaning this test can be used) for the duration of the COVID-19 declaration under Section 564(b)(1) of the Act, 21 U.S.C. section 360bbb-3(b)(1), unless the authorization is terminated or revoked.  Performed at [facility], [address] [place], [place] [address]   Urine Drug Screen, Qualitative     Status: Abnormal   Collection Time: 10/22/21  7:25 PM  Result Value Ref Range   Tricyclic, Ur Screen NONE DETECTED NONE DETECTED   Amphetamines, Ur Screen POSITIVE (A) NONE DETECTED   MDMA (Ecstasy)Ur Screen POSITIVE (A) NONE DETECTED   Cocaine Metabolite,Ur [place] NONE DETECTED NONE DETECTED   Opiate, Ur Screen NONE DETECTED NONE DETECTED   Phencyclidine (PCP) Ur S NONE DETECTED NONE DETECTED   Cannabinoid 50 Ng, Ur [place] POSITIVE (A) NONE DETECTED   Barbiturates, Ur Screen NONE DETECTED NONE DETECTED   Benzodiazepine, Ur Scrn NONE DETECTED NONE DETECTED   Methadone Scn, Ur NONE DETECTED NONE DETECTED    Comment: (NOTE) Tricyclics + metabolites, urine    Cutoff 1000 ng/mL Amphetamines + metabolites, urine  Cutoff 1000 ng/mL MDMA (Ecstasy),  urine              Cutoff 500 ng/mL  Cocaine Metabolite, urine          Cutoff 300 ng/mL Opiate + metabolites, urine        Cutoff 300 ng/mL Phencyclidine (PCP), urine         Cutoff 25 ng/mL Cannabinoid, urine                 Cutoff 50 ng/mL Barbiturates + metabolites, urine  Cutoff 200 ng/mL Benzodiazepine, urine              Cutoff 200 ng/mL Methadone, urine                   Cutoff 300 ng/mL  The urine drug screen provides only a preliminary, unconfirmed analytical test result and should not be used for non-medical purposes. Clinical consideration and professional judgment should be applied to any positive drug screen result due to possible interfering substances. A more specific alternate chemical method must be used in order to obtain a confirmed analytical result. Gas chromatography / mass spectrometry (GC/MS) is the preferred confirm atory method. Performed at [facility], [address] [place], [place] [address]     Current Facility-Administered Medications  Medication Dose Route Frequency Provider Last Rate Last Admin   hydrOXYzine (ATARAX) tablet 25 mg  25 mg Oral Q6H PRN Gilles ChiquitoSmith, Zachary P, MD   25 mg at 10/22/21 2119   mirtazapine (REMERON) tablet 15 mg  15 mg Oral QHS Gilles ChiquitoSmith, Zachary P, MD   15 mg at 10/22/21 2118   OLANZapine (ZYPREXA) tablet 2.5 mg  2.5 mg Oral QHS Gilles ChiquitoSmith, Zachary P, MD   2.5 mg at 10/22/21 2118   traZODone (DESYREL) tablet 50 mg  50 mg Oral QHS Gilles ChiquitoSmith, Zachary P, MD   50 mg at 10/22/21 2118   venlafaxine [facility](EFFEXOR) tablet 37.5 mg  37.5 mg Oral Daily Gilles ChiquitoSmith, Zachary P, MD   37.5 mg at 10/23/21 0945   Current Outpatient Medications  Medication Sig Dispense Refill   DULoxetine (CYMBALTA) 30 MG capsule Take 30-60 mg by mouth 2 (two) times daily.     guanFACINE (INTUNIV) 1 MG TB24 ER tablet Take 1 mg by mouth every morning.     hydrOXYzine (VISTARIL) 25 MG capsule Take 25 mg by mouth every 6 (six) hours as needed.     mirtazapine (REMERON) 15 MG  tablet Take 15 mg by mouth at bedtime.     OLANZapine (ZYPREXA) 2.5 MG tablet Take 2.5 mg by mouth at bedtime.     traZODone (DESYREL) 50 MG tablet Take 50 mg by mouth at bedtime.     venlafaxine (EFFEXOR) 37.5 MG tablet Take 37.5 mg by mouth daily.     ARIPiprazole (ABILIFY) 10 MG tablet Take 10 mg by mouth at bedtime. (Patient not taking: Reported on 10/22/2021)     hydrOXYzine (ATARAX) 25 MG tablet SMARTSIG:0.5-1 Tablet(s) By Mouth 3 Times a Week (Patient not taking: Reported on 10/22/2021)      Musculoskeletal: Strength & Muscle Tone: within normal limits Gait & Station: normal Patient leans: N/A            Psychiatric Specialty Exam:  Presentation  General Appearance: Casual; Appropriate for Environment  Eye Contact:Fair  Speech:Clear and Coherent  Speech Volume:Decreased  Handedness:Right   Mood and Affect  Mood:Depressed; Dysphoric  Affect:Congruent   Thought Process  Thought Processes:Coherent  Descriptions of Associations:Intact  Orientation:Full (Time, Place and Person)  Thought Content:WDL  History of Schizophrenia/Schizoaffective disorder:No  Duration of Psychotic Symptoms:No data recorded Hallucinations:No data recorded  Ideas of Reference:None  Suicidal Thoughts:No data recorded Homicidal Thoughts:No data recorded  Sensorium  Memory:Immediate Poor; Remote Poor  Judgment:Impaired  Insight:Poor; Lacking   Executive Functions  Concentration:Good  Attention Span:Poor  Recall:Poor  Fund of Knowledge:Poor  Language:Poor   Psychomotor Activity  Psychomotor Activity:No data recorded  Assets  Assets:Housing; Vocational/Educational   Sleep  Sleep:No data recorded  Physical Exam: Physical Exam Vitals and nursing note reviewed.  Constitutional:      Appearance: Normal appearance.  HENT:     Head: Normocephalic and atraumatic.     Mouth/Throat:     Pharynx: Oropharynx is clear.  Eyes:     Pupils: Pupils are equal,  round, and reactive to light.  Cardiovascular:     Rate and Rhythm: Normal rate and regular rhythm.  Pulmonary:     Effort: Pulmonary effort is normal.     Breath sounds: Normal breath sounds.  Abdominal:     General: Abdomen is flat.     Palpations: Abdomen is soft.  Musculoskeletal:        General: Normal range of motion.  Skin:    General: Skin is warm and dry.  Neurological:     General: No focal deficit present.     Mental Status: He is alert. Mental status is at baseline.  Psychiatric:        Attention and Perception: He is inattentive.        Mood and Affect: Mood normal. Affect is blunt.        Speech: Speech is delayed.        Behavior: Behavior is slowed.        Thought Content: Thought content normal.        Cognition and Memory: Memory is impaired.        Judgment: Judgment is impulsive.    Review of Systems  Constitutional: Negative.   HENT: Negative.    Eyes: Negative.   Respiratory: Negative.    Cardiovascular: Negative.   Gastrointestinal: Negative.   Musculoskeletal: Negative.   Skin: Negative.   Neurological: Negative.   Psychiatric/Behavioral:  Positive for memory loss and substance abuse. Negative for depression, hallucinations and suicidal ideas. The patient is not nervous/anxious and does not have insomnia.    Blood pressure 107/67, pulse 82, temperature 97.9 F (36.6 C), temperature source Oral, resp. rate 16, height 5\' 5"  (1.651 m), weight 56.7 kg, SpO2 97 %. Body mass index is 20.8 kg/m.  Treatment Plan Summary: Plan patient currently awake and lucid.  Denies suicidal or homicidal thought.  Does not have any interest in engaging in substance use disorder treatment.  History suggests that prior admissions have not been of any benefit to him and he has not followed up with outpatient treatment.  Psychoeducation provided to the patient about the dangerous in fact deadly possible outcomes of his continued behavior and he was reminded that substance use  treatment is readily available and encouraged to think about getting involved in treatment.  Disposition: No evidence of imminent risk to self or others at present.   Patient does not meet criteria for psychiatric inpatient admission.  , MD 10/23/2021 5:55 PM

## 2021-10-23 NOTE — ED Notes (Signed)
Patient stable in NAD. He is discharged to home via family. Discharged instruction given and patient verbalized understanding. Patient belongings given before discharge. No issues.

## 2021-10-23 NOTE — ED Notes (Signed)
Patient given lunch tray.

## 2021-10-23 NOTE — ED Notes (Signed)
Pt discharging home. Discharge teaching dne and pt verbalized understanding. Pt signed paper discharge form. Given all his personal belongings. On phone calling for his ride. Stable, ambulatory and in NAD.

## 2021-10-23 NOTE — ED Notes (Addendum)
Renette Butters from [employer] in [place] called to say that this pt is on pre-trial release. He is requesting that he be contacted when pt is discharged at [phone]. He states pt is supposed to be on ankle monitoring but his monitor is not working. After talking to Dr Toni Amend, he says not to call them about pt discharge.

## 2021-10-23 NOTE — ED Notes (Addendum)
Mother came to visit with pt, observed conversing in spanish then pt came to Clinical research associate and stated  "When can I leave?". Pt educated he is on IVC and is awaiting psychiatry eval and he will be updated on POC once psychiatry has seen him. Pt's mother was standing behind him waving her hands and motioning with her head "no". She mouthed "no " several times. Pt then went back to his room. Pts' mother states she feels pt's needs inpatient admission and rehab. She stated that pt is abusing drugs and she is afraid he will go back to doing drugs if discharged without treatment. Pt's mother states psychiatry can call her if there are any questions at [phone]. She reports that number is for the pt's sister who speaks [place] and can translate for her.

## 2021-10-23 NOTE — ED Provider Notes (Signed)
Patient seen by psychiatry, director clinic PACS recommending discharge with the patient for treatment of substance use disorder with outpatient follow-up.   Sharyn Creamer, MD 10/23/21 [phone]

## 2021-10-23 NOTE — ED Notes (Signed)
Pt had breakfast tray at this time. 

## 2021-10-23 NOTE — ED Notes (Signed)
Pt asleep will try for vitals again later.

## 2021-11-06 ENCOUNTER — Emergency Department
Admission: EM | Admit: 2021-11-06 | Discharge: 2021-11-06 | Disposition: A | Discharge status: Home / Self Care | Payer: Medicaid Other | Attending: Student in an Organized Health Care Education/Training Program | Admitting: Student in an Organized Health Care Education/Training Program

## 2021-11-06 DIAGNOSIS — F1910 Other psychoactive substance abuse, uncomplicated: Secondary | ICD-10-CM | POA: Diagnosis not present

## 2021-11-06 NOTE — ED Provider Notes (Signed)
[facility] Provider Note    Event Date/Time   First MD Initiated Contact with Patient 11/06/21 1312     (approximate)   History   Drug Overdose   HPI  Cong Edward Shelton is a 21 y.o. male presents to the ER via EMS after using ketamine.  He is not clear as to who called EMS.  By the EMS found the patient intoxicated appearing with white powder substance in his nose.  Patient states he was using this recreationally and did not have any intent to harm himself.  He denies any discomfort no pain.  No hallucinations.  No nausea or vomiting.  No shortness of breath.     Physical Exam   Triage Vital Signs: ED Triage Vitals  Enc Vitals Group     BP 11/06/21 1313 (!) 138/90     Pulse Rate 11/06/21 1313 89     Resp 11/06/21 1313 (!) 9     Temp 11/06/21 1313 97.7 F (36.5 C)     Temp Source 11/06/21 1313 Oral     SpO2 11/06/21 1313 99 %     Weight 11/06/21 1314 145 lb (65.8 kg)     Height --      Head Circumference --      Peak Flow --      Pain Score --      Pain Loc --      Pain Edu? --      Excl. in GC? --     Most recent vital signs: Vitals:   11/06/21 1317 11/06/21 1458  BP:  130/88  Pulse:  74  Resp: 16 15  Temp:  98.5 F (36.9 C)  SpO2:  99%     Constitutional: Alert  Eyes: Conjunctivae are normal.  Head: Atraumatic. Nose: No congestion/rhinnorhea.  White powder substance in nose Mouth/Throat: Mucous membranes are moist.   Neck: Painless ROM.  Cardiovascular:   Good peripheral circulation. Respiratory: Normal respiratory effort.  No retractions.  Gastrointestinal: Soft and nontender.  Musculoskeletal:  no deformity Neurologic:  MAE spontaneously. No gross focal neurologic deficits are appreciated.  Skin:  Skin is warm, dry and intact. No rash noted. Psychiatric: Mood and affect are normal. Speech and behavior are normal.    ED Results / Procedures / Treatments   Labs (all labs ordered are listed, but only abnormal results  are displayed) Labs Reviewed - No data to display   EKG     RADIOLOGY    PROCEDURES:  Critical Care performed:   Procedures   MEDICATIONS ORDERED IN ED: Medications - No data to display   IMPRESSION / MDM / ASSESSMENT AND PLAN / ED COURSE  I reviewed the triage vital signs and the nursing notes.                              Differential diagnosis includes, but is not limited to, overdose, recreational misadventure, polysubstance abuse  Patient presented ER for evaluation after using ketamine.  On arrival he did appear slightly under the influence but no sign of trauma.  Neurovascularly intact.  Exam reassuring.  Denies any abdominal pain.  Denies any intent to harm self.     Clinical Course as of 11/06/21 1559  Fri Nov 06, 2021  1435 Patient reassessed.  Remains nontoxic-appearing.  He is asymptomatic.  Denies any chest pain or shortness of breath.  No nausea or vomiting.  Reiterates that this  was recreational use did not have any intent for self-harm.  Do not feel that diagnostic testing clinically indicated.  Will await family to arrive for safe ride home disposition [PR]    Clinical Course User Index [PR] Willy Eddy, MD     FINAL CLINICAL IMPRESSION(S) / ED DIAGNOSES   Final diagnoses:  Polysubstance abuse (HCC)     Rx / DC Orders   ED Discharge Orders     None        Note:  This document was prepared using Dragon voice recognition software and may include unintentional dictation errors.    Willy Eddy, MD 11/06/21 1600

## 2021-11-06 NOTE — ED Triage Notes (Signed)
Pt arrived via EMS. Per EMS pt sts that he took some ketamine. EMS called out to pt for a overdose. RN observes also that pt has a white powder substance around his nares. Pt is conscious and breathing at this time. A/Ox3

## 2021-11-06 NOTE — ED Notes (Signed)
Pt mother contacted. Mother is on the way to ED.

## 2021-12-09 DIAGNOSIS — F3181 Bipolar II disorder: Secondary | ICD-10-CM | POA: Diagnosis not present

## 2021-12-15 DIAGNOSIS — F3181 Bipolar II disorder: Secondary | ICD-10-CM | POA: Diagnosis not present

## 2022-03-26 DIAGNOSIS — F332 Major depressive disorder, recurrent severe without psychotic features: Secondary | ICD-10-CM | POA: Diagnosis not present

## 2022-04-29 ENCOUNTER — Emergency Department
Admission: EM | Admit: 2022-04-29 | Discharge: 2022-04-29 | Discharge status: Against Medical Advice | Payer: Medicaid Other | Attending: Emergency Medicine | Admitting: Emergency Medicine

## 2022-04-29 DIAGNOSIS — Z5321 Procedure and treatment not carried out due to patient leaving prior to being seen by health care provider: Secondary | ICD-10-CM | POA: Insufficient documentation

## 2022-04-29 DIAGNOSIS — R6889 Other general symptoms and signs: Secondary | ICD-10-CM | POA: Diagnosis not present

## 2022-04-29 DIAGNOSIS — R4182 Altered mental status, unspecified: Secondary | ICD-10-CM | POA: Diagnosis present

## 2022-04-29 DIAGNOSIS — R5381 Other malaise: Secondary | ICD-10-CM | POA: Diagnosis not present

## 2022-04-29 NOTE — ED Notes (Signed)
First Nurse-pt brought in via ems from [facility].  Hx depression.  Pt took too much ketamine, ams.  Pt talking on cell phone in [place].  BP 149/84, p 70's, Resp 18  fsbs 110 per ems.  Pt in wheelchair in lobby.

## 2022-04-29 NOTE — ED Notes (Signed)
Pt moving about in lobby, sitting in chair.

## 2022-05-14 ENCOUNTER — Emergency Department
Admission: EM | Admit: 2022-05-14 | Discharge: 2022-05-14 | Disposition: A | Discharge status: Home / Self Care | Payer: No Typology Code available for payment source | Attending: Emergency Medicine | Admitting: Emergency Medicine

## 2022-05-14 DIAGNOSIS — F1910 Other psychoactive substance abuse, uncomplicated: Secondary | ICD-10-CM | POA: Insufficient documentation

## 2022-05-14 LAB — COMPREHENSIVE METABOLIC PANEL
ALT: 16 U/L (ref 0–44)
AST: 19 U/L (ref 15–41)
Albumin: 5.2 g/dL — ABNORMAL HIGH (ref 3.5–5.0)
Alkaline Phosphatase: 126 U/L (ref 38–126)
Anion gap: 12 (ref 5–15)
BUN: 9 mg/dL (ref 6–20)
CO2: 22 mmol/L (ref 22–32)
Calcium: 9.2 mg/dL (ref 8.9–10.3)
Chloride: 98 mmol/L (ref 98–111)
Creatinine, Ser: 0.67 mg/dL (ref 0.61–1.24)
GFR, Estimated: >60 mL/min (ref >60)
Glucose, Bld: 96 mg/dL (ref 70–99)
Potassium: 3.7 mmol/L (ref 3.5–5.1)
Sodium: 132 mmol/L — ABNORMAL LOW (ref 135–145)
Total Bilirubin: 1.2 mg/dL (ref 0.3–1.2)
Total Protein: 8.1 g/dL (ref 6.5–8.1)

## 2022-05-14 LAB — CBC
HCT: 45.9 % (ref 39.0–52.0)
Hemoglobin: 16.4 g/dL (ref 13.0–17.0)
MCH: 29.9 pg (ref 26.0–34.0)
MCHC: 35.7 g/dL (ref 30.0–36.0)
MCV: 83.8 fL (ref 80.0–100.0)
Platelets: 200 10*3/uL (ref 150–400)
RBC: 5.48 MIL/uL (ref 4.22–5.81)
RDW: 12.9 % (ref 11.5–15.5)
WBC: 9 10*3/uL (ref 4.0–10.5)
nRBC: 0 % (ref 0.0–0.2)

## 2022-05-14 LAB — URINE DRUG SCREEN, QUALITATIVE (ARMC ONLY)
Amphetamines, Ur Screen: POSITIVE — AB
Barbiturates, Ur Screen: NOT DETECTED
Benzodiazepine, Ur Scrn: NOT DETECTED
Cannabinoid 50 Ng, Ur ~~LOC~~: NOT DETECTED
Cocaine Metabolite,Ur ~~LOC~~: NOT DETECTED
MDMA (Ecstasy)Ur Screen: POSITIVE — AB
Methadone Scn, Ur: NOT DETECTED
Opiate, Ur Screen: NOT DETECTED
Phencyclidine (PCP) Ur S: NOT DETECTED
Tricyclic, Ur Screen: NOT DETECTED

## 2022-05-14 NOTE — ED Notes (Signed)
This RN attempted to call pt's sisters Jana Half and [facility]). Left voicemail to [facility] requesting a call back at [phone]

## 2022-05-14 NOTE — ED Provider Notes (Signed)
[facility] Provider Note    Event Date/Time   First MD Initiated Contact with Patient 05/14/22 1705     (approximate)   History   Used "K"  HPI  Edward Shelton is a 22 y.o. male reports no medical history.  He reports that he took ketamine today.  He does so on a fairly frequent basis and gets it from friends.  He reports he enjoys the use of ketamine.  Used it earlier.  No medication use is typically used for "forces"  Denies any desire to harm himself or anyone else.  Does not wish to hurt himself.  This was not an attempt to self injure but he reports that his use of ketamine to get high     Physical Exam   Triage Vital Signs: ED Triage Vitals  Enc Vitals Group     BP      Pulse      Resp      Temp      Temp src      SpO2      Weight      Height      Head Circumference      Peak Flow      Pain Score      Pain Loc      Pain Edu?      Excl. in [place]?     Most recent vital signs: Vitals:   05/14/22 1710  BP: 122/89  Pulse: 83  Resp: 14  Temp: 98.7 F (37.1 C)  SpO2: 98%     General: Awake, no distress.  He is fully alert he is oriented to self.  He is able to ambulate in the ER.  Patient walking back-and-forth requesting use the phone, reports he wants to call a ride but not ready to leave yet Extraocular movements noted mild nystagmus in both vertical and lateral directions.  Pupils equal round reactive to light.  Lung sounds are clear normal heart tones and rate.  Normal work of breathing. CV:  Good peripheral perfusion.  Resp:  Normal effort.  Abd:  No distention.  Other:  Well-built.  Appears in no distress.  No slurred speech, his extraocular movements though demonstrate some abnormality with nystagmus.  Moves all extremities equally without tremor.  He is alert and fully oriented to date place and situation describing what happened  Denies desire to harm himself or anyone else.  Reports that he uses "K" to get  high.   ED Results / Procedures / Treatments   Labs (all labs ordered are listed, but only abnormal results are displayed) Labs Reviewed  COMPREHENSIVE METABOLIC PANEL - Abnormal; Notable for the following components:      Result Value   Sodium 132 (*)    Albumin 5.2 (*)    All other components within normal limits  URINE DRUG SCREEN, QUALITATIVE ([facility] ONLY) - Abnormal; Notable for the following components:   Amphetamines, Ur Screen POSITIVE (*)    MDMA (Ecstasy)Ur Screen POSITIVE (*)    All other components within normal limits  CBC  ETHANOL     EKG     RADIOLOGY     PROCEDURES:  Critical Care performed: No  Procedures   MEDICATIONS ORDERED IN ED: Medications - No data to display   IMPRESSION / MDM / [place] / ED COURSE  I reviewed the triage vital signs and the nursing notes.  Differential diagnosis includes, but is not limited to, polysubstance abuse, illicit affect, metabolic or toxic abnormality though this seems unlikely.  He denies any acute psychiatric symptoms he is neurologically intact alert well-oriented able to ambulate with stable gait but does show some evidence of being under the influence of intoxicants and that he has some nystagmus but his mental status is appropriate he is calm and follows commands.  ----------------------------------------- 6:17 PM on 05/14/2022 ----------------------------------------- Patient alert fully oriented no distress.  He is well-appearing at this time no evidence of ongoing nystagmus or evidence of intoxication.  He is alert well oriented, understands the risk of illicit drug use.  Advised has made a phone call and someone will be coming to pick him up.  He will not be driving tonight or walking himself home. Is waiting for ride.  Patient's presentation is most consistent with acute illness / injury with system symptoms.  Return precautions and treatment recommendations  and follow-up discussed with the patient who is agreeable with the plan.   The patient is on the cardiac monitor to evaluate for evidence of arrhythmia and/or significant heart rate changes.      FINAL CLINICAL IMPRESSION(S) / ED DIAGNOSES   Final diagnoses:  Polysubstance abuse ([place])     Rx / DC Orders   ED Discharge Orders     None        Note:  This document was prepared using Dragon voice recognition software and may include unintentional dictation errors.   Delman Kitten, MD 05/14/22 [phone]

## 2022-05-14 NOTE — ED Notes (Signed)
Pt presents to ED via [facility] with c/o of taking ketamine he gets from his horses and states it makes him "horsey". Pt states he does twice a week and in between does MDMA. Pt denies SI or HI. Pt alert and slightly disoriented but following commands.

## 2022-05-14 NOTE — ED Notes (Addendum)
MD approved for pt departure despite inability to contact family. Pt was becoming agitated and wanted to go home. Did not think his family would pick him up. Pt given directions for bus use

## 2022-05-14 NOTE — ED Notes (Signed)
Pt just called mom and is upset because he wants to leave and she wont come get him. Pt seems upset but is sitting down and compliant at this moment.

## 2022-05-14 NOTE — ED Notes (Addendum)
This RN explained to pt that he is unsafe to go home. Pt redirectable at this time. This RN wrapped his IV in gauze as he was picking at it. States he has not had any "recent" thoughts of suicide but takes Ketamine for depression. Denies seeing a counselor or anyone else for his depression

## 2022-05-14 NOTE — ED Notes (Signed)
ED Provider at bedside. 

## 2022-05-14 NOTE — ED Notes (Addendum)
Pt attempted to call his mom for a ride, but she is unable to pick him up. Pt currently waiting for ride and will call mom back a little bit later.

## 2022-06-23 DIAGNOSIS — F3181 Bipolar II disorder: Secondary | ICD-10-CM | POA: Diagnosis not present

## 2022-07-15 DIAGNOSIS — F3181 Bipolar II disorder: Secondary | ICD-10-CM | POA: Diagnosis not present

## 2022-08-02 DIAGNOSIS — F3181 Bipolar II disorder: Secondary | ICD-10-CM | POA: Diagnosis not present

## 2022-08-14 ENCOUNTER — Emergency Department
Admission: EM | Admit: 2022-08-14 | Discharge: 2022-08-14 | Disposition: A | Discharge status: Home / Self Care | Payer: Medicaid Other | Attending: Emergency Medicine | Admitting: Emergency Medicine

## 2022-08-14 ENCOUNTER — Other Ambulatory Visit: Payer: Self-pay

## 2022-08-14 DIAGNOSIS — R350 Frequency of micturition: Secondary | ICD-10-CM | POA: Diagnosis not present

## 2022-08-14 LAB — CHLAMYDIA/NGC RT PCR (ARMC ONLY)
Chlamydia Tr: NOT DETECTED
N gonorrhoeae: NOT DETECTED

## 2022-08-14 LAB — URINALYSIS, ROUTINE W REFLEX MICROSCOPIC
Bacteria, UA: NONE SEEN
Bilirubin Urine: NEGATIVE
Glucose, UA: NEGATIVE mg/dL
Hgb urine dipstick: NEGATIVE
Ketones, ur: NEGATIVE mg/dL
Leukocytes,Ua: NEGATIVE
Nitrite: NEGATIVE
Protein, ur: NEGATIVE mg/dL
Specific Gravity, Urine: 1.02 (ref 1.005–1.030)
Squamous Epithelial / HPF: NONE SEEN /HPF (ref 0–5)
pH: 7.5 (ref 5.0–8.0)

## 2022-08-14 LAB — CBG MONITORING, ED: Glucose-Capillary: 103 mg/dL — ABNORMAL HIGH (ref 70–99)

## 2022-08-14 NOTE — ED Triage Notes (Signed)
Pt reports for past 4 days has been using the bathroom a lot. Pt reports no hx of diabetes with himself or in his family. Pt admits to being sexually active and does not use protection. Pt denies penile discharge.

## 2022-08-14 NOTE — Discharge Instructions (Signed)
Your gonorrhea and chlamydia test returned negative.

## 2022-08-14 NOTE — ED Provider Triage Note (Signed)
Emergency Medicine Provider Triage Evaluation Note  Edward Shelton , a 22 y.o. male  was evaluated in triage.  Pt complains of urinary frequency x 4 days. Denies penile discharge.  No family history of diabetes.   Review of Systems  Positive: polyuria Negative: No fever, flank pain, or hx of stones  Physical Exam  Ht 5\' 3"  (1.6 m)   Wt 59 kg   BMI 23.03 kg/m  Gen:   Awake, no distress   Resp:  Normal effort  MSK:   Moves extremities without difficulty  Other:    Medical Decision Making  Medically screening exam initiated at 3:09 PM.  Appropriate orders placed.  Edward Shelton was informed that the remainder of the evaluation will be completed by another provider, this initial triage assessment does not replace that evaluation, and the importance of remaining in the ED until their evaluation is complete.     Tommi Rumps, PA-C 08/14/22 1511

## 2022-08-27 NOTE — ED Provider Notes (Signed)
[facility] Provider Note  Patient Contact: 6:39 PM (approximate)   History   Urinary Frequency   HPI  Edward Shelton is a 22 y.o. male presents to the emergency department with concern for some increased urinary frequency.  No dysuria.  Patient also states that he is recently had unprotected sex.  No penile discharge.  He does have concern for potential STDs.      Physical Exam   Triage Vital Signs: ED Triage Vitals  Enc Vitals Group     BP 08/14/22 1508 (!) 120/102     Pulse Rate 08/14/22 1508 88     Resp 08/14/22 1508 20     Temp 08/14/22 1508 98.5 F (36.9 C)     Temp Source 08/14/22 1508 Oral     SpO2 08/14/22 1508 100 %     Weight 08/14/22 1506 130 lb (59 kg)     Height 08/14/22 1506 5\' 3"  (1.6 m)     Head Circumference --      Peak Flow --      Pain Score 08/14/22 1505 0     Pain Loc --      Pain Edu? --      Excl. in [place]? --     Most recent vital signs: Vitals:   08/14/22 1508  BP: (!) 120/102  Pulse: 88  Resp: 20  Temp: 98.5 F (36.9 C)  SpO2: 100%     General: Alert and in no acute distress. Eyes:  PERRL. EOMI. Head: No acute traumatic findings ENT:      Nose: No congestion/rhinnorhea.      Mouth/Throat: Mucous membranes are moist. Neck: No stridor. No cervical spine tenderness to palpation. Cardiovascular:  Good peripheral perfusion Respiratory: Normal respiratory effort without tachypnea or retractions. Lungs CTAB. Good air entry to the bases with no decreased or absent breath sounds Gastrointestinal: Bowel sounds 4 quadrants. Soft and nontender to palpation. No guarding or rigidity. No palpable masses. No distention. No CVA tenderness. Musculoskeletal: Full range of motion to all extremities.  Neurologic:  No gross focal neurologic deficits are appreciated.  Skin:   No rash noted    ED Results / Procedures / Treatments   Labs (all labs ordered are listed, but only abnormal results are displayed) Labs  Reviewed  URINALYSIS, ROUTINE W REFLEX MICROSCOPIC - Abnormal; Notable for the following components:      Result Value   Color, Urine YELLOW (*)    All other components within normal limits  CBG MONITORING, ED - Abnormal; Notable for the following components:   Glucose-Capillary 103 (*)    All other components within normal limits  CHLAMYDIA/NGC RT PCR ([facility] ONLY)                  PROCEDURES:  Critical Care performed: No  Procedures   MEDICATIONS ORDERED IN ED: Medications - No data to display   IMPRESSION / MDM / ASSESSMENT AND PLAN / ED COURSE  I reviewed the triage vital signs and the nursing notes.                              Assessment and plan Increased Urinary Frequency: 22 year old male presents to the emergency department with increased urinary frequency.  Gonorrhea and Chlamydia negative.  Urinalysis shows no signs of UTI.  Reassurance was given.  Return precautions were given to return with new or worsening symptoms.  FINAL CLINICAL IMPRESSION(S) / ED DIAGNOSES   Final diagnoses:  Increased urinary frequency     Rx / DC Orders   ED Discharge Orders     None        Note:  This document was prepared using Dragon voice recognition software and may include unintentional dictation errors.   [name], PA-C 08/27/22 2230    [name], MD 08/31/22 [phone]

## 2022-09-09 DIAGNOSIS — F333 Major depressive disorder, recurrent, severe with psychotic symptoms: Secondary | ICD-10-CM | POA: Diagnosis not present

## 2022-09-10 DIAGNOSIS — F333 Major depressive disorder, recurrent, severe with psychotic symptoms: Secondary | ICD-10-CM | POA: Diagnosis not present

## 2022-09-11 DIAGNOSIS — F333 Major depressive disorder, recurrent, severe with psychotic symptoms: Secondary | ICD-10-CM | POA: Diagnosis not present

## 2022-09-12 DIAGNOSIS — F333 Major depressive disorder, recurrent, severe with psychotic symptoms: Secondary | ICD-10-CM | POA: Diagnosis not present

## 2022-09-13 DIAGNOSIS — F333 Major depressive disorder, recurrent, severe with psychotic symptoms: Secondary | ICD-10-CM | POA: Diagnosis not present

## 2022-09-14 DIAGNOSIS — F333 Major depressive disorder, recurrent, severe with psychotic symptoms: Secondary | ICD-10-CM | POA: Diagnosis not present

## 2022-11-03 ENCOUNTER — Other Ambulatory Visit: Payer: Self-pay

## 2022-11-03 ENCOUNTER — Emergency Department
Admission: EM | Admit: 2022-11-03 | Discharge: 2022-11-03 | Disposition: A | Discharge status: Home / Self Care | Payer: MEDICAID | Attending: Emergency Medicine | Admitting: Emergency Medicine

## 2022-11-03 DIAGNOSIS — T402X1A Poisoning by other opioids, accidental (unintentional), initial encounter: Secondary | ICD-10-CM | POA: Insufficient documentation

## 2022-11-03 DIAGNOSIS — T50901A Poisoning by unspecified drugs, medicaments and biological substances, accidental (unintentional), initial encounter: Secondary | ICD-10-CM

## 2022-11-03 DIAGNOSIS — D72829 Elevated white blood cell count, unspecified: Secondary | ICD-10-CM | POA: Insufficient documentation

## 2022-11-03 DIAGNOSIS — T50904A Poisoning by unspecified drugs, medicaments and biological substances, undetermined, initial encounter: Secondary | ICD-10-CM | POA: Diagnosis not present

## 2022-11-03 DIAGNOSIS — R9431 Abnormal electrocardiogram [ECG] [EKG]: Secondary | ICD-10-CM | POA: Diagnosis not present

## 2022-11-03 DIAGNOSIS — T887XXA Unspecified adverse effect of drug or medicament, initial encounter: Secondary | ICD-10-CM | POA: Diagnosis not present

## 2022-11-03 LAB — URINE DRUG SCREEN, QUALITATIVE (ARMC ONLY)
Amphetamines, Ur Screen: NOT DETECTED
Barbiturates, Ur Screen: NOT DETECTED
Benzodiazepine, Ur Scrn: NOT DETECTED
Cannabinoid 50 Ng, Ur ~~LOC~~: NOT DETECTED
Cocaine Metabolite,Ur ~~LOC~~: POSITIVE — AB
MDMA (Ecstasy)Ur Screen: NOT DETECTED
Methadone Scn, Ur: NOT DETECTED
Opiate, Ur Screen: NOT DETECTED
Phencyclidine (PCP) Ur S: NOT DETECTED
Tricyclic, Ur Screen: NOT DETECTED

## 2022-11-03 LAB — CBC
HCT: 39.2 % (ref 39.0–52.0)
Hemoglobin: 14.3 g/dL (ref 13.0–17.0)
MCH: 30.5 pg (ref 26.0–34.0)
MCHC: 36.5 g/dL — ABNORMAL HIGH (ref 30.0–36.0)
MCV: 83.6 fL (ref 80.0–100.0)
Platelets: 188 10*3/uL (ref 150–400)
RBC: 4.69 MIL/uL (ref 4.22–5.81)
RDW: 13.1 % (ref 11.5–15.5)
WBC: 19.7 10*3/uL — ABNORMAL HIGH (ref 4.0–10.5)
nRBC: 0 % (ref 0.0–0.2)

## 2022-11-03 LAB — COMPREHENSIVE METABOLIC PANEL
ALT: 36 U/L (ref 0–44)
AST: 34 U/L (ref 15–41)
Albumin: 4.3 g/dL (ref 3.5–5.0)
Alkaline Phosphatase: 57 U/L (ref 38–126)
Anion gap: 8 (ref 5–15)
BUN: 16 mg/dL (ref 6–20)
CO2: 26 mmol/L (ref 22–32)
Calcium: 8.6 mg/dL — ABNORMAL LOW (ref 8.9–10.3)
Chloride: 103 mmol/L (ref 98–111)
Creatinine, Ser: 0.95 mg/dL (ref 0.61–1.24)
GFR, Estimated: >60 mL/min (ref >60)
Glucose, Bld: 146 mg/dL — ABNORMAL HIGH (ref 70–99)
Potassium: 3.1 mmol/L — ABNORMAL LOW (ref 3.5–5.1)
Sodium: 137 mmol/L (ref 135–145)
Total Bilirubin: 1.1 mg/dL (ref 0.3–1.2)
Total Protein: 7.3 g/dL (ref 6.5–8.1)

## 2022-11-03 LAB — ETHANOL: Alcohol, Ethyl (B): <10 mg/dL (ref <10)

## 2022-11-03 MED ORDER — NALOXONE HCL 4 MG/0.1ML NA LIQD
1.0000 | Freq: Once | NASAL | Status: AC
  Administered 2022-11-03: 1 via NASAL
  Filled 2022-11-03: qty 4

## 2022-11-03 MED ORDER — POTASSIUM CHLORIDE CRYS ER 20 MEQ PO TBCR
40.0000 meq | EXTENDED_RELEASE_TABLET | Freq: Once | ORAL | Status: AC
  Administered 2022-11-03: 40 meq via ORAL
  Filled 2022-11-03: qty 2
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
## 2022-11-03 NOTE — ED Notes (Signed)
Pt A&O x4, no obvious distress noted, respirations regular/unlabored. Pt verbalizes understanding of discharge instructions. Pt able to ambulate from ED independently.   

## 2022-11-03 NOTE — ED Notes (Signed)
Pt asking this RN for "ketamine" and "weed"

## 2022-12-15 ENCOUNTER — Emergency Department: Payer: MEDICAID

## 2022-12-15 ENCOUNTER — Emergency Department
Admission: EM | Admit: 2022-12-15 | Discharge: 2022-12-15 | Disposition: A | Discharge status: Home / Self Care | Payer: MEDICAID | Attending: Emergency Medicine | Admitting: Emergency Medicine

## 2022-12-15 DIAGNOSIS — S20212A Contusion of left front wall of thorax, initial encounter: Secondary | ICD-10-CM | POA: Diagnosis not present

## 2022-12-15 DIAGNOSIS — S20302A Unspecified superficial injuries of left front wall of thorax, initial encounter: Secondary | ICD-10-CM | POA: Diagnosis present

## 2022-12-15 DIAGNOSIS — S0083XA Contusion of other part of head, initial encounter: Secondary | ICD-10-CM | POA: Insufficient documentation

## 2022-12-15 MED ORDER — LIDOCAINE 5 % EX PTCH
1.0000 | MEDICATED_PATCH | CUTANEOUS | Status: DC
  Administered 2022-12-15: 1 via TRANSDERMAL
  Filled 2022-12-15: qty 1

## 2022-12-15 MED ORDER — ACETAMINOPHEN 500 MG PO TABS
1000.0000 mg | ORAL_TABLET | Freq: Once | ORAL | Status: AC
  Administered 2022-12-15: 1000 mg via ORAL
  Filled 2022-12-15: qty 2

## 2022-12-15 NOTE — ED Notes (Signed)
Patient transported to X-ray 
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
## 2022-12-15 NOTE — Discharge Instructions (Addendum)
Please take over-the-counter acetaminophen and ibuprofen as needed according to label instructions.  Use cold packs as needed.
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
## 2023-07-06 DIAGNOSIS — F333 Major depressive disorder, recurrent, severe with psychotic symptoms: Secondary | ICD-10-CM | POA: Diagnosis not present

## 2023-07-28 DIAGNOSIS — F333 Major depressive disorder, recurrent, severe with psychotic symptoms: Secondary | ICD-10-CM | POA: Diagnosis not present

## 2023-11-04 DIAGNOSIS — R45851 Suicidal ideations: Secondary | ICD-10-CM | POA: Diagnosis not present

## 2023-11-04 NOTE — ED Notes (Addendum)
 Pt stating took acid and molly last night to get over sadness but requesting help to get to [facility] wishes to stay here until bed available there. Pt stating was dropped off by family in [place] for treatment but no bed available

## 2023-11-04 NOTE — ED Provider Notes (Signed)
 EMERGENCY DEPARTMENT ENCOUNTER     CHIEF COMPLAINT   Chief Complaint  Patient presents with  . Psych Problem    Pt BIB EMS for hallucinations and depression. Hx of schizophrenia and hasn't been able to get his Abilify  due to an insurance issue. Denies SI/HI.   EMERGENCY DEPARTMENT ENCOUNTER     CHIEF COMPLAINT   Chief Complaint  Patient presents with  . Psych Problem    Pt BIB EMS for hallucinations and depression. Hx of schizophrenia and hasn't been able to get his Abilify  due to an insurance issue. Denies SI/HI.     HPI   Edward Shelton is a 23 y.o. male with past medical history of schizophrenia.  Patient states he has a slight increase in hallucinations.  Patient is adamant he is not suicidal not homicidal.  Patient states he has been off his medications for about 1 week.  He is requesting IM Abilify .  He states he takes 20 of Abilify  at home.  He states he had an insurance issue and ran out of Abilify  about a week ago.  He states IM injection usually hits him much faster.  He denies being suicidal homicidal he is forward thinking but states he needs his medication.  PAST MEDICAL HISTORY   Past Medical History:  Diagnosis Date  . Schizophrenia (*)     SURGICAL HISTORY   History reviewed. No pertinent surgical history.  CURRENT MEDICATIONS   Current Medications[1]  ALLERGIES   Allergies[2]  FAMILY HISTORY   History reviewed. No pertinent family history.  SOCIAL HISTORY   Social History[3]  PHYSICAL EXAM   VITAL SIGNS: BP 138/87   Pulse 110   Temp 98.6 F (37 C)   Resp 17   Ht 1.6 m (5' 3)   Wt 59 kg (130 lb)   SpO2 100%   BMI 23.03 kg/m  Constitutional:  No acute distress, non-toxic appearance.  Mildly pressured speech Eyes:  PERRLA, conjunctiva normal, no discharge HENT: Atraumatic, moist mucus membranes Neck:  Normal inspection, symmetrical Respiratory:  NO respiratory distress, speaking in full sentences with no difficulty. NO crackles or wheezes  auscultated.  Cardiovascular:  Regular rhythm, no murmurs auscultated Abdomen:  Soft, Bowel sound are present, No abdominal tenderness, no peritoneal signs.  Extremities: No pedal edema, ROM grossly intact in joints with no significant pain.  Skin:  Warm, No rash noted Neurologic:  Awake and alert, gross normal cognition. No focal deficits noted,  CN II-XII grossly intact. No gross motor weakness noted, No sensory deficit detected. Vitals:   11/04/23 0623  BP: 138/87  Pulse: 110  Resp: 17  Temp: 98.6 F (37 C)  SpO2: 100%  Weight: 59 kg (130 lb)  Height: 1.6 m (5' 3)     ED COURSE & MEDICAL DECISION MAKING   Pertinent labs & imaging studies reviewed. (See chart for details) 23 y.o. male with a history of schizophrenia.  Patient has been off his medications for about a week.  She is requesting IM Abilify .  Discussed with pharmacist.  It appears the IM Abilify  would take much longer to have an effect on the patient.  Discussed with patient he is comfortable with plan.  Will give him a dose of oral meds here.  He does not meet any IVC criteria is not suicidal not homicidal.  He is forward thinking and has has follow-up with psychiatry.  FINAL IMPRESSION   Final diagnoses:  Mood disorder    Disposition: ED Disposition  ED Disposition  Discharge   Condition  Stable   Comment  --                 [1] Current Facility-Administered Medications  Medication Dose Route Frequency Provider Last Rate Last Admin  . ARIPiprazole  (ABILIFY ) tablet 20 mg  20 mg Oral ONCE Dorn Han, MD       No current outpatient medications on file.  [2] No Known Allergies [3] Social History Socioeconomic History  . Marital status: Single   Dorn Han, MD 11/04/23 [phone]

## 2023-11-04 NOTE — Consults (Signed)
 Psychiatric Assessment Consult/Psychiatric Medical Screening Exam  [facility] [address] [place], [place] [address] Name: Edward Shelton DOB: 10-01-2000 HAR: [id number] MRN: [id number] Date: 11/04/2023    HPI:   This is a 23 y.o. old male who presented to the ED for reported hallucinations in the context of substance use on 11/04/2023.  Patient reports a history of depression and states he takes medications but cannot remember the names.  Patient is a poor historian states that he is here because he is tired.  Patient notes that he was hospitalized 2 days ago but then states he has never been in the hospital.  Patient notes that he is tired and does not want to answer any more questions.  Patient adamantly denies any SI or HI or any hallucinations.  Does note that he has been using ketamine which she obtains off the dark web as well as other substances.    Psychiatric ROS: Positive noted by +  Thoughts of harming self or others helplessness, hopelessness, worthlessness trouble with sleep, energy, concentration, libido, or appetite auditory hallucinations, visual hallucinations, paranoia, delusion, or IOR periods of time with decreased sleep, increased energy, hypereligiosity, or grandiosity nightmares or flashbacks from any prior traumas  Obessions, compulsions, rituals overwhelming anxiety, irritability, restlessness or episodes consistent with panic  Medical ROS Review of Systems  Respiratory: Negative.    Cardiovascular: Negative.   Psychiatric/Behavioral:  Negative for depression, hallucinations and suicidal ideas.      Past Psychiatric History: Patient with a history of multiple presentations to [facility] in June and July 2023 for substance induced psychoses and polysubstance use. [facility] with [facility] in the context of substance use including but not limited to cocaine and was apparently diagnosed with a history of  schizophrenia while high on cocaine.   Family Medical and Psychiatric History: Family member with psychiatric or mental illness: None reported  Social History: Social History[1]   Substance Abuse History: Patient with long history of polysubstance use with but not limited to methamphetamines, cocaine, cannabis   Past Medical History: Past Medical History:  Diagnosis Date  . Schizophrenia (*)      Current Medications: Prior to Admission medications  Medication Sig Start Date End Date Taking? Authorizing Provider  ARIPiprazole  (ABILIFY ) 20 MG tablet Take one tablet (20 mg dose) by mouth daily for 14 days. 11/04/23 11/18/23  Dorn Han, MD  fluoxetine  (PROZAC ) 40 MG capsule Take one capsule (40 mg dose) by mouth daily for 14 days. 11/04/23 11/18/23  Dorn Han, MD     Allergies: Allergies[2]   Speciality Physical Exam: Psychiatry  Musculoskeletal: No abnormal movements noted  Neurological: Gait observed patient lying in bed  Physical Exam Constitutional: BP 113/72   Pulse 102   Temp (!) 96.4 F (35.8 C) (Tympanic)   Resp 18   Ht 1.6 m (5' 3)   Wt 59 kg (130 lb)   SpO2 100%   BMI 23.03 kg/m  General:  Alert, oriented xperson and place, no acute distress  Mental Status Exam:  Appearance & Hygiene: Appearance: alert, well appearing, and in no distress. Psychomotor Activity:psychomotor retardation Eye Contact: poor Attitude: poor Mood:neutral Affect: Full range Speech: delayed, increased latency of response, and soft Thought Process: logical connections Associations: tight Perception (Hallucinations): denies hallucinations Thought Content:denies suicidal, homicidal, and paranoid ideation Insight: fair  Judgement: poor  Cognition: Orientation:person, place, and time Memory: intact Attention & Concentration: intact Language:fluent and spontaneous without dysarthric features  Labs Lab Results  Component Value Date   WBC 17.7 (H) 11/04/2023   HGB  15.9 11/04/2023   Plt Ct 222 11/04/2023   Creatinine 0.98 11/04/2023   BUN 17 11/04/2023   Na 136 11/04/2023   Potassium 3.4 11/04/2023   Cl 101 11/04/2023   CO2 23 11/04/2023   ALT 37 11/04/2023   AST 71 (H) 11/04/2023   ALK PHOS 87 11/04/2023   T Bili 2.2 (H) 11/04/2023       EKG/Radiology/Procedures No results found for this or any previous visit.  I have personally reviewed the above radiological and Laboratory data.     Assessment/Pan --patient was reason presentation due to reported hallucinations in the contact of methamphetamine and cocaine use.  Patient with a long history of substance use and his presentation to the emergency department appears to be secondary to continued longstanding substance use. We will continue to monitor in the emergency department. --Labs, and imaging, electrocardiogram reviewed as above.   Assessment & Plan Severe cocaine use disorder (*)   Severe methamphetamine use disorder (*)   Hallucinogen use with hallucinogen-induced disorder (*)        Portions of this note may be dictated using Dragon Naturally Speaking voice recognition software.  Variances in spelling and vocabulary are possible and unintentional.  Not all errors are caught/corrected.  Please notify the dino if any discrepancies are noted or if the meaning of any statement is not clear.  [place] C Spellman, MD, 11/04/2023, 1:23 PM          [1] Social History Socioeconomic History  . Marital status: Single  Tobacco Use  . Smoking status: Never  . Smokeless tobacco: Never  Substance and Sexual Activity  . Drug use: Not Currently  [2] No Known Allergies

## 2023-11-04 NOTE — ED Notes (Signed)
 Assumed care of pt, received report.  Pt resting on bed with even, unlabored respirations in no distress, sitter bedside.

## 2023-11-04 NOTE — ED Provider Notes (Signed)
 EMERGENCY DEPARTMENT ENCOUNTER     CHIEF COMPLAINT   Chief Complaint  Patient presents with  . Psych Problem    PT REPORTS SI, SEEN EARLIER TODAY FOR MED REFIL, REPORTS WORSENING MANIC SYMTPOMS, FEELS THE URGE TO USE DRUGS TO HARM HIMSELF      HPI   Edward Shelton is a 23 y.o. male with past medical history of schizophrenia.  Patient states he takes 20 mg Abilify  a day.  Has been out of his medication for about a week as well as 40 mg of Prozac .  I saw the patient earlier in the day.  He states he needed his medications.  He was not suicidal not homicidal.  Patient was given the medications and a prescription and discharged he states that symptoms are worsening he is now having some passive suicidal thoughts.  He does not feel safe at home he denies taking any medications today.  PAST MEDICAL HISTORY   Past Medical History:  Diagnosis Date  . Schizophrenia (*)     SURGICAL HISTORY   History reviewed. No pertinent surgical history.  CURRENT MEDICATIONS   Current Medications[1]  ALLERGIES   Allergies[2]  FAMILY HISTORY   History reviewed. No pertinent family history.  SOCIAL HISTORY   Social History[3]  PHYSICAL EXAM   VITAL SIGNS: BP 113/72   Pulse 102   Temp (!) 96.4 F (35.8 C) (Tympanic)   Resp 18   Ht 1.6 m (5' 3)   Wt 59 kg (130 lb)   SpO2 100%   BMI 23.03 kg/m  Constitutional:  No acute distress, non-toxic appearance.  Slightly pressured speech Eyes:  PERRLA, conjunctiva normal, no discharge HENT: Atraumatic, moist mucus membranes Neck:  Normal inspection, symmetrical Respiratory:  NO respiratory distress, speaking in full sentences with no difficulty. NO crackles or wheezes auscultated.  Cardiovascular:  Regular rhythm, no murmurs auscultated Abdomen:  Soft, Bowel sound are present, No abdominal tenderness, no peritoneal signs.  Extremities: No pedal edema, ROM grossly intact in joints with no significant pain.  Skin:  Warm, No rash noted Neurologic:   Awake and alert, gross normal cognition. No focal deficits noted,  CN II-XII grossly intact. No gross motor weakness noted, No sensory deficit detected. Vitals:   11/04/23 1107 11/04/23 1108  BP: 113/72   Pulse: 102   Resp: 18   Temp:  (!) 96.4 F (35.8 C)  TempSrc:  Tympanic  SpO2: 100%   Weight: 59 kg (130 lb)   Height: 1.6 m (5' 3)     LABS Abnormal Labs Reviewed  CBC AND DIFFERENTIAL - Abnormal; Notable for the following components:      Result Value   WBC 17.7 (*)    MCHC 36.6 (*)    ABSOLUTE NEUTROPHIL COUNT 13.30 (*)    Absolute Monocyte Count 1.74 (*)    Absolute Immature Granulocyte Count 0.09 (*)    All other components within normal limits  COMPREHENSIVE METABOLIC PANEL - Abnormal; Notable for the following components:   Glucose 141 (*)    T Bili 2.2 (*)    Alb 5.2 (*)    AST 71 (*)    All other components within normal limits     ED COURSE & MEDICAL DECISION MAKING   Pertinent labs & imaging studies reviewed. (See chart for details) 23 y.o. male with past medical history of schizophrenia.  Patient presents stating he is having more manic episodes hallucinations.  He states he does not feel safe at home.  Is recently seen  for medication refill.  At this point we will have psychiatry see the patient he is very willing to stay in the emergency department.  Screening laboratory studies showed total bili of 2.2 is not having abdominal pain.  White count of 17.7.  Will set him to repeat tomorrow do not believe has acute gallbladder or cholecystitis.        FINAL IMPRESSION   Final diagnoses:  Suicidal ideation  Mood disorder    Disposition: ED Disposition     ED Disposition  Behavioral Health    Condition  --   Comment  --                    [1] Current Facility-Administered Medications  Medication Dose Route Frequency Provider Last Rate Last Admin  . acetaminophen  (TYLENOL ) tablet 650 mg  650 mg Oral Q6H PRN Dorn Han, MD       . aluminum & magnesium  hydroxide-simethicone (MAALOX,MYLANTA,ANTACID ANTI-GAS) 200-200-20 mg/5 mL oral suspension 30 mL  30 mL Oral Q6H PRN Dorn Han, MD      . benzocaine-menthol 15-3.6 mg (CEPACOL) lozenge  1 lozenge Mouth/Throat Q2H PRN Dorn Han, MD      . benztropine (COGENTIN) tablet 1 mg  1 mg Oral Q6H PRN Dorn Han, MD       Or  . benztropine mesylate (COGENTIN) injection 1 mg  1 mg Intramuscular Q6H PRN Dorn Han, MD      . cetirizine (ZYRTEC) tablet 10 mg  10 mg Oral Daily PRN Dorn Han, MD      . folic acid tablet 1 mg  1 mg Oral Daily Dorn Han, MD   1 mg at 11/04/23 1152  . haloperidol  (HALDOL ) tablet 5 mg  5 mg Oral Q4H PRN Dorn Han, MD       Or  . haloperidol  lactate (HALDOL ) injection 5 mg  5 mg Intramuscular Q4H PRN Dorn Han, MD      . hydrOXYzine  pamoate (VISTARIL ) capsule 50 mg  50 mg Oral Q4H PRN Dorn Han, MD      . ondansetron (ZOFRAN-ODT) disintegrating tablet 4 mg  4 mg Oral Q4H PRN Dorn Han, MD       Or  . ondansetron (ZOFRAN) injection 4 mg  4 mg Intramuscular Q4H PRN Dorn Han, MD      . polyethylene glycol (MIRALAX) packet 17 g  17 g Oral Daily PRN Dorn Han, MD      . sodium chloride (ALTAMIST,OCEAN,DEEP SEA NASAL SPRAY) 0.65% nasal solution 2 spray  2 spray Both Nostrils Q4H PRN Dorn Han, MD      . VERBA [facility]) 1 tablet  1 tablet Oral Daily Dorn Han, MD   1 tablet at 11/04/23 1152  . thiamine (VITAMIN B-1) tablet 100 mg  100 mg Oral Daily Dorn Han, MD   100 mg at 11/04/23 1152  . traZODone  (DESYREL ) tablet 50 mg  50 mg Oral HS PRN Dorn Han, MD       Current Outpatient Medications  Medication Sig Dispense Refill  . ARIPiprazole  (ABILIFY ) 20 MG tablet Take one tablet (20 mg dose) by mouth daily for 14 days. 14 tablet 0  . fluoxetine  (PROZAC ) 40 MG capsule Take one capsule (40 mg dose) by mouth daily for 14 days. 14 capsule 0  [2] No Known  Allergies [3] Social History Socioeconomic History  . Marital status: Single  Tobacco Use  . Smoking status: Never  . Smokeless tobacco: Never  Substance and Sexual Activity  .  Drug use: Not Currently   Dorn Han, MD 11/04/23 1208

## 2023-11-04 NOTE — ED Notes (Signed)
 Patient wanded by company police.

## 2023-11-05 NOTE — ED Notes (Signed)
 Pt escorted off unit at this time by RN with mom at bedside

## 2023-11-05 NOTE — ED Notes (Signed)
Male visitor at bedside.

## 2023-11-05 NOTE — Nursing Note (Signed)
 AVS reviewed with pnt with verbalized understanding/ copy given.  Pnt escorted/discharged to ED lobby with mother in stable condition, ambulatory with steady gait

## 2023-11-05 NOTE — Progress Notes (Signed)
 Psychiatry Progress Note [facility] [address] [place], [place] [address]      Name: Edward Shelton MR#: [id number] HAR#: [id number] DOB:  2001/02/08 Admit Date: 11/04/2023 Length of Stay: 0  Chief Complaints  Chief Complaint  Patient presents with  . Psych Problem    PT REPORTS SI, SEEN EARLIER TODAY FOR MED REFIL, REPORTS WORSENING MANIC SYMTPOMS, FEELS THE URGE TO USE DRUGS TO HARM HIMSELF       Subjective: Patient was seen and evaluated today and currently reports that he wants to be discharged today.  Patient states that he came in for hearing voices.  Patient notes however that he has been on his antipsychotics for the past 2 days and is not hearing voices.  Patient notes that his mother is here to take him to [facility] upon discharge.  When asking patient if he was using any substances prior to coming and patient denies despite having UDS positive for cocaine and methamphetamines.  Patient adamantly denies any SI or HI or any hallucinations at this time.  Review of Systems  Review of Systems  Respiratory: Negative.    Cardiovascular: Negative.   Psychiatric/Behavioral:  Negative for depression, hallucinations and suicidal ideas.     Social History: Social History[1]    Past Medical History: Past Medical History:  Diagnosis Date  . Hallucinogen use with hallucinogen-induced disorder (*) 11/04/2023  . Severe methamphetamine use disorder (*) 11/04/2023     Current Medications: Prior to Admission medications  Medication Sig Start Date End Date Taking? Authorizing Provider  ARIPiprazole  (ABILIFY ) 20 MG tablet Take one tablet (20 mg dose) by mouth daily for 14 days. 11/04/23 11/18/23  Dorn Han, MD  fluoxetine  (PROZAC ) 40 MG capsule Take one capsule (40 mg dose) by mouth daily for 14 days. 11/04/23 11/18/23  Dorn Han, MD     Allergies: Allergies[2]  Medications: . folic acid  1 mg Oral Daily  . THERA   1 tablet Oral Daily  . thiamine  100 mg Oral Daily     Allergies: Allergies[3]   Laboratory information: Lab Results  Component Value Date   WBC 9.7 11/05/2023   HGB 14.5 11/05/2023   Plt Ct 171 11/05/2023   Creatinine 0.77 11/05/2023   BUN 12 11/05/2023   Na 141 11/05/2023   Potassium 3.8 11/05/2023   Cl 105 11/05/2023   CO2 28 11/05/2023   ALT 31 11/05/2023   AST 46 (H) 11/05/2023   ALK PHOS 70 11/05/2023   T Bili 1.8 (H) 11/05/2023     I have reviewed the above laboratory information   Speciality Physical Exam: Psychiatry  Musculoskeletal: No abnormal movements noted  Neurological: Gait normal   Physical Exam Constitutional: BP 95/61 (BP Location: Right Upper Arm, Patient Position: Lying)   Pulse 75   Temp 97.7 F (36.5 C) (Oral)   Resp 17   Ht 1.6 m (5' 3)   Wt 59 kg (130 lb)   SpO2 100%   BMI 23.03 kg/m  General:  Alert, oriented xperson, place, and time, no acute distress   Mental Status Exam:  Appearance & Hygiene: Appearance: alert, well appearing, and in no distress. Psychomotor Activity:within normal limits Eye Contact: good  Attitude: good  Mood: Euthymic Affect: Full range and congruent to mood Speech: normal pitch and normal volume Thought Process: logical connections Associations: tight Perception (Hallucinations): denies hallucinations Thought Content:denies suicidal, homicidal, and paranoid ideation Insight: fair to poor with relation to substance use Judgement:  poor with relation to substance use, fair otherwise     Assessment and Plan: Patient's presentation to the emergency department expressing hallucinations have completely resolved and sobering up from substances.  Patient has one go to [facility] and denies any SI or HI at this time does not have any somatic/psychiatric symptoms that would appear to warrant further observation the emergency department.  We will discharge patient encouraged him to follow up  with [facility] treatment [facility] and to abstain from all toxic/illicit substances. Assessment & Plan Severe cocaine use disorder (*)   Severe methamphetamine use disorder (*)   Hallucinogen use with hallucinogen-induced disorder (*)    Portions of this note may be dictated using Dragon Naturally Speaking voice recognition software.  Variances in spelling and vocabulary are possible and unintentional.  Not all errors are caught/corrected.  Please notify the dino if any discrepancies are noted or if the meaning of any statement is not clear.  [place] C Spellman, MD, 11/05/2023, 10:44 AM             [1] Social History Socioeconomic History  . Marital status: Single  Tobacco Use  . Smoking status: Never  . Smokeless tobacco: Never  Substance and Sexual Activity  . Drug use: Not Currently  [2] No Known Allergies [3] No Known Allergies

## 2023-11-05 NOTE — ED Notes (Signed)
 Pt resting on bed with even, unlabored respirations in no distress, easily arousable.  Pt denies any pain or needs at this time.  AM labs drawn and sent.

## 2023-11-05 NOTE — ED Provider Notes (Signed)
 ED Psychiatric Observation Note    History of Present Illness: Edward Shelton is a 23 y.o. male who is being observed awaiting continued psychiatric treatment and transfer to psychiatric hospital.    Current Medications[1]    Review Of Systems:  Constitutional: Denies fevers or chills Eyes:  Denies acute changes in vision. Respiratory:  No sob or significant cough Cardiovascular: No CP or palpitations Integument:  Denies rash. Neurologic:  Denies headache, numbness, tingling, weakness, sensory or motor changes.  Physical Examination: BP 95/61 (BP Location: Right Upper Arm, Patient Position: Lying)   Pulse 75   Temp 97.7 F (36.5 C) (Oral)   Resp 17   Ht 1.6 m (5' 3)   Wt 59 kg (130 lb)   SpO2 100%   BMI 23.03 kg/m   General:  Awake, alert, no acute distress.  Eyes:  sclera white, conjunctiva clear  HENT:  Atraumatic, Mucus membranes moist.  Pulm:  No respiratory distress or audible wheezing Musculoskeletal/Extrem:  No acute swelling or pain. Integument: Clean, dry, intact without rash or abnormality. Neurologic:  Alert & oriented,  No focal deficits.. Psychiatric: Calm and cooperative   Meds: Medications  aluminum & magnesium  hydroxide-simethicone (MAALOX,MYLANTA,ANTACID ANTI-GAS) 200-200-20 mg/5 mL oral suspension 30 mL (has no administration in time range)  hydrOXYzine  pamoate (VISTARIL ) capsule 50 mg (has no administration in time range)  haloperidol  (HALDOL ) tablet 5 mg (has no administration in time range)    Or  haloperidol  lactate (HALDOL ) injection 5 mg (has no administration in time range)  benztropine (COGENTIN) tablet 1 mg (has no administration in time range)    Or  benztropine mesylate (COGENTIN) injection 1 mg (has no administration in time range)  traZODone  (DESYREL ) tablet 50 mg (has no administration in time range)  acetaminophen  (TYLENOL ) tablet 650 mg (has no administration in time range)  ondansetron (ZOFRAN-ODT) disintegrating tablet 4 mg  (has no administration in time range)    Or  ondansetron (ZOFRAN) injection 4 mg (has no administration in time range)  polyethylene glycol (MIRALAX) packet 17 g (has no administration in time range)  folic acid tablet 1 mg (1 mg Oral Given 11/04/23 1152)  THERA (THEREMS,ONE-DAILY) 1 tablet (1 tablet Oral Given 11/04/23 1152)  thiamine (VITAMIN B-1) tablet 100 mg (100 mg Oral Given 11/04/23 1152)  benzocaine-menthol 15-3.6 mg (CEPACOL) lozenge (has no administration in time range)  cetirizine (ZYRTEC) tablet 10 mg (has no administration in time range)  sodium chloride (ALTAMIST,OCEAN,DEEP SEA NASAL SPRAY) 0.65% nasal solution 2 spray (has no administration in time range)    Assessment:    Condition:  In stable condition   Progression:  Unchanged   Assessment/Plan Details:  Behavioral health rounding completed. I have evaluated this pt who is alert and oriented, calm and cooperative. Labs reviewed. Vital signs stable. No needs/concerns voiced at this time. Pending psych placement.   Plan:    Transfer:  Awaiting dispo per behavioral health team   activity:  Ad lib activity      [1] Current Facility-Administered Medications  Medication Dose Route Frequency Provider Last Rate Last Admin  . acetaminophen  (TYLENOL ) tablet 650 mg  650 mg Oral Q6H PRN Dorn Han, MD      . aluminum & magnesium  hydroxide-simethicone (MAALOX,MYLANTA,ANTACID ANTI-GAS) 200-200-20 mg/5 mL oral suspension 30 mL  30 mL Oral Q6H PRN Dorn Han, MD      . benzocaine-menthol 15-3.6 mg (CEPACOL) lozenge  1 lozenge Mouth/Throat Q2H PRN Dorn Han, MD      . benztropine (COGENTIN) tablet  1 mg  1 mg Oral Q6H PRN Dorn Han, MD       Or  . benztropine mesylate (COGENTIN) injection 1 mg  1 mg Intramuscular Q6H PRN Dorn Han, MD      . cetirizine (ZYRTEC) tablet 10 mg  10 mg Oral Daily PRN Dorn Han, MD      . folic acid tablet 1 mg  1 mg Oral Daily Dorn Han, MD   1 mg at 11/04/23 1152  .  haloperidol  (HALDOL ) tablet 5 mg  5 mg Oral Q4H PRN Dorn Han, MD       Or  . haloperidol  lactate (HALDOL ) injection 5 mg  5 mg Intramuscular Q4H PRN Dorn Han, MD      . hydrOXYzine  pamoate (VISTARIL ) capsule 50 mg  50 mg Oral Q4H PRN Dorn Han, MD      . ondansetron (ZOFRAN-ODT) disintegrating tablet 4 mg  4 mg Oral Q4H PRN Dorn Han, MD       Or  . ondansetron (ZOFRAN) injection 4 mg  4 mg Intramuscular Q4H PRN Dorn Han, MD      . polyethylene glycol (MIRALAX) packet 17 g  17 g Oral Daily PRN Dorn Han, MD      . sodium chloride (ALTAMIST,OCEAN,DEEP SEA NASAL SPRAY) 0.65% nasal solution 2 spray  2 spray Both Nostrils Q4H PRN Dorn Han, MD      . VERBA [facility]) 1 tablet  1 tablet Oral Daily Dorn Han, MD   1 tablet at 11/04/23 1152  . thiamine (VITAMIN B-1) tablet 100 mg  100 mg Oral Daily Dorn Han, MD   100 mg at 11/04/23 1152  . traZODone  (DESYREL ) tablet 50 mg  50 mg Oral HS PRN Dorn Han, MD       Current Outpatient Medications  Medication Sig Dispense Refill  . ARIPiprazole  (ABILIFY ) 20 MG tablet Take one tablet (20 mg dose) by mouth daily for 14 days. 14 tablet 0  . fluoxetine  (PROZAC ) 40 MG capsule Take one capsule (40 mg dose) by mouth daily for 14 days. 14 capsule 0   Robynn Modest, PA-C 11/05/23 [phone]

## 2023-11-09 ENCOUNTER — Other Ambulatory Visit: Payer: Self-pay

## 2023-11-09 ENCOUNTER — Encounter: Payer: Self-pay | Admitting: Psychiatry

## 2023-11-09 ENCOUNTER — Emergency Department (EMERGENCY_DEPARTMENT_HOSPITAL)
Admission: EM | Admit: 2023-11-09 | Discharge: 2023-11-09 | Disposition: A | Discharge status: Other Acute Inpatient Hospital | Source: Home / Self Care | Attending: Emergency Medicine | Admitting: Emergency Medicine

## 2023-11-09 ENCOUNTER — Inpatient Hospital Stay
Admission: AD | Admit: 2023-11-09 | Discharge: 2023-11-15 | DRG: 885 | Disposition: A | Discharge status: Home / Self Care | Source: Intra-hospital | Attending: Psychiatry | Admitting: Psychiatry

## 2023-11-09 DIAGNOSIS — F332 Major depressive disorder, recurrent severe without psychotic features: Secondary | ICD-10-CM | POA: Diagnosis not present

## 2023-11-09 DIAGNOSIS — F1994 Other psychoactive substance use, unspecified with psychoactive substance-induced mood disorder: Secondary | ICD-10-CM

## 2023-11-09 DIAGNOSIS — F1490 Cocaine use, unspecified, uncomplicated: Secondary | ICD-10-CM | POA: Insufficient documentation

## 2023-11-09 DIAGNOSIS — F1414 Cocaine abuse with cocaine-induced mood disorder: Secondary | ICD-10-CM | POA: Diagnosis present

## 2023-11-09 DIAGNOSIS — F17290 Nicotine dependence, other tobacco product, uncomplicated: Secondary | ICD-10-CM | POA: Diagnosis present

## 2023-11-09 DIAGNOSIS — Z91148 Patient's other noncompliance with medication regimen for other reason: Secondary | ICD-10-CM

## 2023-11-09 DIAGNOSIS — R45851 Suicidal ideations: Secondary | ICD-10-CM | POA: Insufficient documentation

## 2023-11-09 DIAGNOSIS — Z79899 Other long term (current) drug therapy: Secondary | ICD-10-CM | POA: Diagnosis not present

## 2023-11-09 DIAGNOSIS — Z9151 Personal history of suicidal behavior: Secondary | ICD-10-CM | POA: Diagnosis not present

## 2023-11-09 DIAGNOSIS — F17210 Nicotine dependence, cigarettes, uncomplicated: Secondary | ICD-10-CM | POA: Diagnosis present

## 2023-11-09 DIAGNOSIS — F1990 Other psychoactive substance use, unspecified, uncomplicated: Secondary | ICD-10-CM

## 2023-11-09 DIAGNOSIS — D72829 Elevated white blood cell count, unspecified: Secondary | ICD-10-CM | POA: Diagnosis present

## 2023-11-09 DIAGNOSIS — Z560 Unemployment, unspecified: Secondary | ICD-10-CM | POA: Diagnosis not present

## 2023-11-09 LAB — COMPREHENSIVE METABOLIC PANEL WITH GFR
ALT: 29 U/L (ref 0–44)
AST: 32 U/L (ref 15–41)
Albumin: 4.3 g/dL (ref 3.5–5.0)
Alkaline Phosphatase: 60 U/L (ref 38–126)
Anion gap: 11 (ref 5–15)
BUN: 14 mg/dL (ref 6–20)
CO2: 25 mmol/L (ref 22–32)
Calcium: 9.7 mg/dL (ref 8.9–10.3)
Chloride: 103 mmol/L (ref 98–111)
Creatinine, Ser: 0.72 mg/dL (ref 0.61–1.24)
GFR, Estimated: >60 mL/min (ref >60)
Glucose, Bld: 132 mg/dL — ABNORMAL HIGH (ref 70–99)
Potassium: 3.4 mmol/L — ABNORMAL LOW (ref 3.5–5.1)
Sodium: 139 mmol/L (ref 135–145)
Total Bilirubin: 1.2 mg/dL (ref 0.0–1.2)
Total Protein: 7.4 g/dL (ref 6.5–8.1)

## 2023-11-09 LAB — CBC
HCT: 40.5 % (ref 39.0–52.0)
Hemoglobin: 14.6 g/dL (ref 13.0–17.0)
MCH: 30.6 pg (ref 26.0–34.0)
MCHC: 36 g/dL (ref 30.0–36.0)
MCV: 84.9 fL (ref 80.0–100.0)
Platelets: 191 K/uL (ref 150–400)
RBC: 4.77 MIL/uL (ref 4.22–5.81)
RDW: 13.7 % (ref 11.5–15.5)
WBC: 12 K/uL — ABNORMAL HIGH (ref 4.0–10.5)
nRBC: 0 % (ref 0.0–0.2)

## 2023-11-09 LAB — URINE DRUG SCREEN, QUALITATIVE (ARMC ONLY)
Amphetamines, Ur Screen: NOT DETECTED
Barbiturates, Ur Screen: NOT DETECTED
Benzodiazepine, Ur Scrn: NOT DETECTED
Cannabinoid 50 Ng, Ur ~~LOC~~: NOT DETECTED
Cocaine Metabolite,Ur ~~LOC~~: POSITIVE — AB
MDMA (Ecstasy)Ur Screen: NOT DETECTED
Methadone Scn, Ur: NOT DETECTED
Opiate, Ur Screen: NOT DETECTED
Phencyclidine (PCP) Ur S: NOT DETECTED
Tricyclic, Ur Screen: NOT DETECTED

## 2023-11-09 LAB — ACETAMINOPHEN LEVEL: Acetaminophen (Tylenol), Serum: <10 ug/mL — ABNORMAL LOW (ref 10–30)

## 2023-11-09 LAB — SALICYLATE LEVEL: Salicylate Lvl: <7 mg/dL — ABNORMAL LOW (ref 7.0–30.0)

## 2023-11-09 LAB — ETHANOL: Alcohol, Ethyl (B): <15 mg/dL (ref <15)

## 2023-11-09 MED ORDER — FLUOXETINE HCL 20 MG PO CAPS
40.0000 mg | ORAL_CAPSULE | Freq: Every day | ORAL | Status: DC
  Administered 2023-11-09: 40 mg via ORAL
  Filled 2023-11-09: qty 2

## 2023-11-09 MED ORDER — FLUOXETINE HCL 20 MG PO CAPS
40.0000 mg | ORAL_CAPSULE | Freq: Every day | ORAL | Status: DC
  Administered 2023-11-10 – 2023-11-15 (×8): 40 mg via ORAL
  Filled 2023-11-09 (×6): qty 2

## 2023-11-09 MED ORDER — MAGNESIUM HYDROXIDE 400 MG/5ML PO SUSP: 30.0000 mL | Freq: Every day | ORAL | Status: DC | PRN

## 2023-11-09 MED ORDER — ARIPIPRAZOLE 10 MG PO TABS
20.0000 mg | ORAL_TABLET | Freq: Every day | ORAL | Status: DC
  Administered 2023-11-10 – 2023-11-15 (×8): 20 mg via ORAL
  Filled 2023-11-09 (×6): qty 2

## 2023-11-09 MED ORDER — LORAZEPAM 2 MG/ML IJ SOLN: 2.0000 mg | Freq: Three times a day (TID) | INTRAMUSCULAR | Status: DC | PRN

## 2023-11-09 MED ORDER — NICOTINE POLACRILEX 2 MG MT GUM
2.0000 mg | CHEWING_GUM | OROMUCOSAL | Status: DC | PRN
  Administered 2023-11-10 – 2023-11-13 (×4): 2 mg via ORAL
  Filled 2023-11-09 (×4): qty 1

## 2023-11-09 MED ORDER — DIPHENHYDRAMINE HCL 25 MG PO CAPS
50.0000 mg | ORAL_CAPSULE | Freq: Three times a day (TID) | ORAL | Status: DC | PRN
Start: 2023-11-09 — End: 2023-11-15

## 2023-11-09 MED ORDER — HYDROXYZINE HCL 25 MG PO TABS: 25.0000 mg | ORAL_TABLET | Freq: Four times a day (QID) | ORAL | Status: DC | PRN

## 2023-11-09 MED ORDER — HALOPERIDOL LACTATE 5 MG/ML IJ SOLN: 10.0000 mg | Freq: Three times a day (TID) | INTRAMUSCULAR | Status: DC | PRN

## 2023-11-09 MED ORDER — DIPHENHYDRAMINE HCL 50 MG/ML IJ SOLN: 50.0000 mg | Freq: Three times a day (TID) | INTRAMUSCULAR | Status: DC | PRN

## 2023-11-09 MED ORDER — HALOPERIDOL LACTATE 5 MG/ML IJ SOLN: 5.0000 mg | Freq: Three times a day (TID) | INTRAMUSCULAR | Status: DC | PRN

## 2023-11-09 MED ORDER — ARIPIPRAZOLE 10 MG PO TABS
20.0000 mg | ORAL_TABLET | Freq: Every day | ORAL | Status: DC
  Administered 2023-11-09: 20 mg via ORAL
  Filled 2023-11-09: qty 2

## 2023-11-09 MED ORDER — ALUM & MAG HYDROXIDE-SIMETH 200-200-20 MG/5ML PO SUSP: 30.0000 mL | ORAL | Status: DC | PRN

## 2023-11-09 MED ORDER — HALOPERIDOL 5 MG PO TABS: 5.0000 mg | ORAL_TABLET | Freq: Three times a day (TID) | ORAL | Status: DC | PRN

## 2023-11-09 MED ORDER — ACETAMINOPHEN 325 MG PO TABS: 650.0000 mg | ORAL_TABLET | Freq: Four times a day (QID) | ORAL | Status: DC | PRN

## 2023-11-09 MED ORDER — TRAZODONE HCL 50 MG PO TABS
50.0000 mg | ORAL_TABLET | Freq: Every evening | ORAL | Status: DC | PRN
Start: 2023-11-09 — End: 2023-11-15
  Filled 2023-11-09: qty 1

## 2023-11-09 NOTE — Tx Team (Signed)
 Initial Treatment Plan 11/09/2023 8:02 PM Edward Shelton FMW:[id number]    PATIENT STRESSORS: Medication change or noncompliance   Substance abuse     PATIENT STRENGTHS: General fund of knowledge  Motivation for treatment/growth    PATIENT IDENTIFIED PROBLEMS: Depression  Medication non compliance  Substance abuse  Suicidal thoughts               DISCHARGE CRITERIA:  Improved stabilization in mood, thinking, and/or behavior  PRELIMINARY DISCHARGE PLAN: Outpatient therapy  PATIENT/FAMILY INVOLVEMENT: This treatment plan has been presented to and reviewed with the patient, Edward Shelton, and/or family member, .  The patient and family have been given the opportunity to ask questions and make suggestions.  Joshua Hoose, RN 11/09/2023, 8:02 PM

## 2023-11-09 NOTE — ED Notes (Signed)
 Transport arranged at this time. Awaiting security escort to [facility].

## 2023-11-09 NOTE — Progress Notes (Signed)
 Patient admitted from [facility] - ED, report received from RN in the ED. Pt presents flat and depressed. Pt stated he had SI prior to coming in because he has been off his meds. Pt stated he relapses on drugs when he runs out of his meds and wants to go to a long term drug rehab. Pt given education, support, and encouragement to be active in his treatment plan. Pt skin assessment completed with Clarita, RN, no abnormalities found. Pt being monitored Q 15 minutes for safety per unit protocol, remains safe on the unit.

## 2023-11-09 NOTE — Consult Note (Signed)
 [facility] Psychiatric Consult Initial  Patient Name: .Edward Shelton  MRN: [id number]  DOB: 2000/08/26  Consult Order details:  Orders (From admission, onward)     Start     Ordered   11/09/23 0217  CONSULT TO CALL ACT TEAM       Ordering Provider: Neomi Josette SAILOR, DO  Provider:  (Not yet assigned)  Question:  Reason for Consult?  Answer:  Psych consult   11/09/23 0217   11/09/23 0217  IP CONSULT TO PSYCHIATRY       Ordering Provider: Neomi Josette SAILOR, DO  Provider:  (Not yet assigned)  Question Answer Comment  Consult Timeframe STAT - requires a response within one hour   STAT timeframe requires provider to provider communication, has the provider to provider communication been completed Yes   Reason for Consult? 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 male admitted: Presented to the EDteven Davius Goudeau is a 23 y.o. male with history of depression, polysubstance use disorder who presents to the emergency department with suicidal thoughts.  States he has a plan to grab a knife and cut himself.  Psychiatry was consulted to evaluate for safety  On assessment patient continues to endorse depression and suicidal ideation with a plan to slash his throat.  He does have extensive history of polysubstance use especially ketamine, cocaine and has history of using LSD, Molly.  Patient is agreeable for inpatient psychiatric admission for further stabilization.     Diagnoses:  Active Hospital problems: Active Problems:   * No active hospital problems. *    Plan   ## Psychiatric Medication Recommendations:  Patient was  on Abilify  and Prozac   ## Medical Decision Making Capacity: Not specifically addressed in this encounter  ## Further Work-up:   No additional workup required   ## Disposition:-- We recommend inpatient psychiatric hospitalization when medically cleared. Patient is under voluntary admission status at this time; please IVC if attempts to leave hospital.  ## Behavioral / Environmental: -Utilize compassion and acknowledge the patient's experiences while setting clear and realistic expectations for care.    ## Safety and Observation Level:  - Based on my clinical evaluation, I estimate the patient to be at moderate risk of self harm in the current setting. - At this time, we recommend  1:1 Observation. This decision is based on my review of the chart including patient's history and current presentation, interview of the patient, mental status examination, and consideration of suicide risk including evaluating suicidal ideation, plan, intent, suicidal or self-harm behaviors, risk factors, and protective factors. This judgment is based on our ability to directly address suicide risk, implement suicide prevention strategies, and develop a safety plan while the patient is in the clinical setting. Please contact our team if there is a concern that risk level has changed.  CSSR Risk Category:C-SSRS RISK CATEGORY: Moderate Risk  Suicide Risk Assessment: Patient has following modifiable risk factors for suicide: under treated depression , which we are addressing by inpatient psychiatric admission. Patient has following non-modifiable or demographic risk factors for  suicide: male gender Patient has the following protective factors against suicide: Supportive family  Thank you for this consult request. Recommendations have been communicated to the primary team.  We will sign off at this time.   Belen Zwahlen, MD       History of Present Illness  Relevant Aspects of Hospital ED   On interview  patient is noted to be awake, alert, oriented x 3.  He reports running out of the medications in the last 2 weeks.  He reports depression, denies feeling hopeless and helpless, reports anhedonia, poor appetite and sleep.  He endorses worsening suicidal ideation for the last few days as he relapsed on Molly and endorses hallucinations but is unable to give clear description of the hallucinations.  He reports having a plan of slashing his throat.  He denies any homicidal ideation/intent/plan.  He denies any current or recent episodes of mania/hypomania.  He denies nightmares or flashbacks and denies any history of abuse.  He reports living by himself but reports having good support from family.    Psychiatric and Social History  Psychiatric History:  Information collected from patient and chart  Past Psychiatric History: Patient with a history of multiple presentations to [facility] health in June and July 2023 for substance induced psychoses and polysubstance use. [facility] with [facility] in the context of substance use including but not limited to cocaine and was apparently diagnosed with a history of schizophrenia while high on cocaine.     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 of ketamine and cocaine Living Situation: Lives by himself Spiritual Hx: None reported Access to weapons/lethal means: Denies  Substance History Alcohol: 2-3 times in a week Type of alcohol beer Last Drink few days ago Number of drinks per day 1-2 drinks of 16 ounce History of alcohol withdrawal seizures denies History of DT's denies Tobacco: Vapes every 5 minutes Illicit drugs: History of use of LSD, Molly, recent use of ketamine and cocaine Prescription drug abuse: Denies Rehab hx: Denies  Exam Findings   Physical Exam: Reviewed and agree with the physical exam findings conducted by the ED provider Vital Signs:  Temp:  [98 F (36.7 C)-98.2 F (36.8 C)] 98 F (36.7 C) (08/06 1021) Pulse Rate:  [71-81] 81 (08/06 1021) Resp:  [16-17] 16 (08/06 1100) BP: (129-131)/(74-80) 129/74 (08/06 1021) SpO2:  [100 %] 100 % (08/06 1021) Weight:  [59 kg] 59 kg (08/06 0129) Blood pressure 129/74, pulse 81, temperature 98 F (36.7 C), temperature source Oral, resp. rate 16, height 5' 4 (1.626 m), weight 59 kg, SpO2 100%. Body mass index is 22.31 kg/m.    Mental Status Exam: General Appearance: Fairly Groomed  Orientation:  Full (Time, Place, and Person)  Memory:  Immediate;   Fair Recent;   Fair Remote;   Fair  Concentration:  Concentration: Fair and Attention Span: Fair  Recall:  Fair  Attention  Fair  Eye Contact:  Fair  Speech:  Clear and Coherent  Language:  Fair  Volume:  Normal  Mood: fine  Affect:  Appropriate  Thought Process:  Coherent  Thought Content:  Logical  Suicidal Thoughts:  Yes.  with intent/plan  Homicidal Thoughts:  No  Judgement:  Impaired  Insight:  Shallow  Psychomotor Activity:  Normal  Akathisia:  No  Fund of Knowledge:  Fair  Assets:  Communication Skills Desire for Improvement  Cognition:  WNL  ADL's:  Intact  AIMS (if indicated):        Other History   These have been pulled in through the EMR, reviewed, and updated if appropriate.  Family History:  The patient's family history includes Healthy in his father and mother.  Medical History: Past Medical History:  Diagnosis Date   Depression     Surgical History: History reviewed. No pertinent surgical history.   Medications:   Current Facility-Administered Medications:    ARIPiprazole  (ABILIFY ) tablet 20 mg, 20 mg, Oral, Daily, Ward, Kristen N, DO, 20 mg at 11/09/23 0901   FLUoxetine  (PROZAC ) capsule 40 mg, 40 mg, Oral, Daily, Ward, Kristen N, DO, 40 mg at 11/09/23 0901  Current  Outpatient Medications:    ARIPiprazole  (ABILIFY ) 20 MG tablet, Take one tablet (20 mg dose) by mouth daily for 14 days., Disp: , Rfl:    FLUoxetine  (PROZAC ) 40 MG capsule, Take one capsule (40 mg dose) by mouth daily for 14 days., Disp: , Rfl:    mirtazapine  (REMERON ) 15 MG tablet, Take 15 mg by mouth at bedtime., Disp: , Rfl:    DULoxetine  (CYMBALTA ) 30 MG capsule, Take 30-60 mg by mouth 2 (two) times daily. (Patient not taking: Reported on 11/09/2023), Disp: , Rfl:    guanFACINE  (INTUNIV ) 1 MG TB24 ER tablet, Take 1 mg by mouth every morning. (Patient not taking: Reported on 11/09/2023), Disp: , Rfl:    hydrOXYzine  (ATARAX ) 25 MG tablet, SMARTSIG:0.5-1 Tablet(s) By Mouth 3 Times a Week (Patient not taking: Reported on 10/22/2021), Disp: , Rfl:    hydrOXYzine  (VISTARIL ) 25 MG capsule, Take 25 mg by mouth every 6 (six) hours as needed. (Patient not taking: Reported on 11/09/2023), Disp: , Rfl:    OLANZapine  (ZYPREXA ) 2.5 MG tablet, Take 2.5 mg by mouth at bedtime. (Patient not taking: Reported on 11/09/2023), Disp: , Rfl:    traZODone  (DESYREL ) 50 MG tablet, Take 50 mg by mouth at bedtime. (Patient not taking: Reported on 11/09/2023), Disp: , Rfl:    venlafaxine  (EFFEXOR ) 37.5 MG tablet, Take 37.5 mg by mouth daily. (Patient not taking: Reported on 11/09/2023), Disp: , Rfl:   Allergies: No Known Allergies  Inola Lisle, MD

## 2023-11-09 NOTE — ED Notes (Signed)
 Patient accepted to [facility]

## 2023-11-09 NOTE — ED Notes (Signed)
 Pt given breakfast.

## 2023-11-09 NOTE — Group Note (Signed)
 Date:  11/09/2023 Time:  8:51 PM  Group Topic/Focus:  Wrap-Up Group:   The focus of this group is to help patients review their daily goal of treatment and discuss progress on daily workbooks.    Participation Level:  Did Not Attend   Additional Comments:  Pt was just coming on to the unit from ED  Maglione,Daila Elbert E 11/09/2023, 8:51 PM

## 2023-11-09 NOTE — ED Provider Notes (Signed)
 [facility] Provider Note    Event Date/Time   First MD Initiated Contact with Patient 11/09/23 0216     (approximate)   History   Psychiatric Evaluation   HPI  Edward Shelton is a 23 y.o. male with history of depression, polysubstance use disorder who presents to the emergency department with suicidal thoughts.  States he has a plan to grab a knife and cut himself.  No HI or hallucinations.  States he has had intermittent suicidal thoughts for the past 2 years and normally they will go away on their own but now they have not.  He states he has not had any alcohol in a couple of days but does report snorting cocaine and ketamine today.   History provided by patient.    Past Medical History:  Diagnosis Date   Depression     History reviewed. No pertinent surgical history.  MEDICATIONS:  Prior to Admission medications   Medication Sig Start Date End Date Taking? Authorizing Provider  ARIPiprazole  (ABILIFY ) 10 MG tablet Take 10 mg by mouth at bedtime. Patient not taking: Reported on 10/22/2021 09/07/21   [provider]  DULoxetine  (CYMBALTA ) 30 MG capsule Take 30-60 mg by mouth 2 (two) times daily. 09/25/21   [provider]  guanFACINE  (INTUNIV ) 1 MG TB24 ER tablet Take 1 mg by mouth every morning. 09/25/21   [provider]  hydrOXYzine  (ATARAX ) 25 MG tablet SMARTSIG:0.5-1 Tablet(s) By Mouth 3 Times a Week Patient not taking: Reported on 10/22/2021 09/25/21   [provider]  hydrOXYzine  (VISTARIL ) 25 MG capsule Take 25 mg by mouth every 6 (six) hours as needed. 09/07/21   [provider]  mirtazapine  (REMERON ) 15 MG tablet Take 15 mg by mouth at bedtime. 09/25/21   [provider]  OLANZapine  (ZYPREXA ) 2.5 MG tablet Take 2.5 mg by mouth at bedtime. 10/13/21   [provider]  traZODone  (DESYREL ) 50 MG tablet Take 50 mg by mouth at bedtime. 10/13/21   [provider]  venlafaxine   (EFFEXOR ) 37.5 MG tablet Take 37.5 mg by mouth daily. 10/13/21   [provider]    Physical Exam   Triage Vital Signs: ED Triage Vitals  Encounter Vitals Group     BP 11/09/23 0130 131/80     Girls Systolic BP Percentile --      Girls Diastolic BP Percentile --      Boys Systolic BP Percentile --      Boys Diastolic BP Percentile --      Pulse Rate 11/09/23 0130 71     Resp 11/09/23 0130 17     Temp 11/09/23 0130 98.2 F (36.8 C)     Temp src --      SpO2 11/09/23 0130 100 %     Weight 11/09/23 0129 130 lb (59 kg)     Height 11/09/23 0129 5' 4 (1.626 m)     Head Circumference --      Peak Flow --      Pain Score 11/09/23 0129 0     Pain Loc --      Pain Education --      Exclude from Growth Chart --     Most recent vital signs: Vitals:   11/09/23 0130  BP: 131/80  Pulse: 71  Resp: 17  Temp: 98.2 F (36.8 C)  SpO2: 100%    CONSTITUTIONAL: Alert, responds appropriately to questions. Well-appearing; well-nourished HEAD: Normocephalic, atraumatic EYES: Conjunctivae clear, pupils appear equal,  sclera nonicteric ENT: normal nose; moist mucous membranes NECK: Supple, normal ROM CARD: RRR; S1 and S2 appreciated RESP: Normal chest excursion without splinting or tachypnea; breath sounds clear and equal bilaterally; no wheezes, no rhonchi, no rales, no hypoxia or respiratory distress, speaking full sentences ABD/GI: Non-distended; soft, non-tender, no rebound, no guarding, no peritoneal signs BACK: The back appears normal EXT: Normal ROM in all joints; no deformity noted, no edema SKIN: Normal color for age and race; warm; no rash on exposed skin NEURO: Moves all extremities equally, normal speech PSYCH: Endorses SI with plan.  No HI.  Not responding to internal stimuli.  Flat affect.   ED Results / Procedures / Treatments   LABS: (all labs ordered are listed, but only abnormal results are displayed) Labs Reviewed  COMPREHENSIVE METABOLIC PANEL WITH GFR -  Abnormal; Notable for the following components:      Result Value   Potassium 3.4 (*)    Glucose, Bld 132 (*)    All other components within normal limits  CBC - Abnormal; Notable for the following components:   WBC 12.0 (*)    All other components within normal limits  SALICYLATE LEVEL - Abnormal; Notable for the following components:   Salicylate Lvl <7.0 (*)    All other components within normal limits  ACETAMINOPHEN  LEVEL - Abnormal; Notable for the following components:   Acetaminophen  (Tylenol ), Serum <10 (*)    All other components within normal limits  ETHANOL  URINE DRUG SCREEN, QUALITATIVE ([facility] ONLY)     EKG:  RADIOLOGY: My personal review and interpretation of imaging:    I have personally reviewed all radiology reports.   No results found.   PROCEDURES:  Critical Care performed: No     Procedures    IMPRESSION / MDM / ASSESSMENT AND PLAN / ED COURSE  I reviewed the triage vital signs and the nursing notes.    Patient here with suicidal thoughts with plan.  Also history of polysubstance use disorder.    DIFFERENTIAL DIAGNOSIS (includes but not limited to):   Depression, anxiety, polysubstance abuse, substance induced mood disorder   Patient's presentation is most consistent with acute presentation with potential threat to life or bodily function.   PLAN: Will obtain screening labs, urine.  Patient here voluntarily.  Will consult TTS, psychiatry.   MEDICATIONS GIVEN IN ED: Medications  ARIPiprazole  (ABILIFY ) tablet 20 mg (has no administration in time range)  FLUoxetine  (PROZAC ) capsule 40 mg (has no administration in time range)     ED COURSE: Labs show leukocytosis of 12,000.  No infectious symptoms.  May be reactive in nature.  Normal electrolytes.  Tylenol  and salicylate levels negative.  Ethanol level negative.  Medically cleared for psychiatric evaluation and disposition.   CONSULTS: TTS and psychiatry consulted for further  disposition.   OUTSIDE RECORDS REVIEWED: Reviewed recent psychiatric notes at [facility] health in [place].       FINAL CLINICAL IMPRESSION(S) / ED DIAGNOSES   Final diagnoses:  Suicidal ideation  Polysubstance use disorder     Rx / DC Orders   ED Discharge Orders     None        Note:  This document was prepared using Dragon voice recognition software and may include unintentional dictation errors.   Harrison Zetina, Josette SAILOR, DO 11/09/23 [phone]

## 2023-11-09 NOTE — BH Assessment (Signed)
 Comprehensive Clinical Assessment (CCA) Screening, Triage and Referral Note  11/09/2023 Edward Shelton [id number]  Edward Shelton, 23 year old male who presents to [facility] ED voluntarily for treatment. Per triage note, Pt reports he has been off his antidepressants for 2 weeks, pt reports thoughts of SI with no plan. Pt was recently seen at [facility] for same on 8/2. Pt calm and cooperative   During TTS assessment pt presents alert and oriented x 4, restless but cooperative, and mood-congruent with affect. The pt does not appear to be responding to internal or external stimuli. Neither is the pt presenting with any delusional thinking. Pt verified the information provided to triage RN.   Pt identifies his main complaint to be that he has been experiencing SI due to being off his psych medications for the past 2 weeks. Patient reports he felt his symptoms were worsening so he came to the hospital. Patient reports having thoughts of suicide is somewhat "normal"; however, the thoughts are occurring more frequently. "I think about slicing my throat." Even though he says he leads a "really decent" life, patient states he has some depression and psychosis. "Spirits start talking to me." Patient states he is unemployed and recently moved out from his parents' home about 6 months ago. Patient states he does not have access to any firearms. Pt reports polysubstance use. Patient reports his most recent use of cocaine was yesterday, ketamine 2 days ago, molly about a week ago and vapes every 5 minutes. Pt reports INPT hx a couple of years ago at [facility] because of a past suicide attempt where patient states he tried to overdose. Patient is not currently being followed by an outpatient provider. Pt denies HI, paranoia and anxiety. Pt is unable to contract for safety.    Per Dr. Jadapalle, pt is recommended for inpatient psychiatric admission.  Chief Complaint:  Chief Complaint  Patient presents with    Psychiatric Evaluation   Visit Diagnosis: Depression  Patient Reported Information How did you hear about us ? No data recorded What Is the Reason for Your Visit/Call Today? Patient came to ED because he has been off meds for 2 weeks with SI and plan to slice his throat.  How Long Has This Been Causing You Problems? > than 6 months  What Do You Feel Would Help You the Most Today? Alcohol or Drug Use Treatment; Medication(s); Treatment for Depression or other mood problem   Have You Recently Had Any Thoughts About Hurting Yourself? Yes  Are You Planning to Commit Suicide/Harm Yourself At This time? No   Have you Recently Had Thoughts About Hurting Someone Edward Shelton? No  Are You Planning to Harm Someone at This Time? No  Explanation: No data recorded  Have You Used Any Alcohol or Drugs in the Past 24 Hours? Yes  How Long Ago Did You Use Drugs or Alcohol? 11/08/23- cocaine  What Did You Use and How Much? Ketamine, cocaine, amphetamines, LSD   Do You Currently Have a Therapist/Psychiatrist? No  Name of Therapist/Psychiatrist: No data recorded  Have You Been Recently Discharged From Any Office Practice or Programs? No  Explanation of Discharge From Practice/Program: No data recorded   CCA Screening Triage Referral Assessment Type of Contact: Face-to-Face  Telemedicine Service Delivery:   Is this Initial or Reassessment?   Date Telepsych consult ordered in [facility]:    Time Telepsych consult ordered in [facility]:    Location of Assessment: [facility] ED  Provider Location: [facility] ED    Collateral  Involvement: None provided   Does Patient Have a Court Appointed Legal Guardian? No data recorded Name and Contact of Legal Guardian: No data recorded If Minor and Not Living with Parent(s), Who has Custody? No data recorded Is CPS involved or ever been involved? No data recorded Is APS involved or ever been involved? No data recorded  Patient Determined To Be At Risk for Harm To Self or Others  Based on Review of Patient Reported Information or Presenting Complaint? Yes, for Self-Harm  Method: Plan without intent  Availability of Means: No access or NA  Intent: Vague intent or NA  Notification Required: No need or identified person  Additional Information for Danger to Others Potential: No data recorded Additional Comments for Danger to Others Potential: No data recorded Are There Guns or Other Weapons in Your Home? No  Types of Guns/Weapons: No data recorded Are These Weapons Safely Secured?                            No data recorded Who Could Verify You Are Able To Have These Secured: No data recorded Do You Have any Outstanding Charges, Pending Court Dates, Parole/Probation? No data recorded Contacted To Inform of Risk of Harm To Self or Others: No data recorded  Does Patient Present under Involuntary Commitment? No    [place] of Residence: [place]   Patient Currently Receiving the Following Services: Not Receiving Services   Determination of Need: Emergent (2 hours)   Options For Referral: ED Visit; Medication Management; Inpatient Hospitalization; Chemical Dependency Intensive Outpatient Therapy (CDIOP)   Disposition Recommendation per psychiatric provider: Per Dr. Jadapalle, pt is recommended for inpatient psychiatric admission.  Arbie Reisz JONELLE Dolly, Counselor, LCAS-A

## 2023-11-09 NOTE — ED Notes (Signed)
 Pt belongings:  [place] tshirt tan pants [employer] Black socks Red bracelet

## 2023-11-09 NOTE — ED Triage Notes (Signed)
 Pt reports he has been off of his antidepressants for 2 weeks, pt reports thoughts of SI with no plan. Pt was recently seen at [facility] for same on 8/2. Pt calm and cooperative

## 2023-11-09 NOTE — BH Assessment (Addendum)
 Patient is to be admitted to [facility] BMU tonight 11/09/23 after 7:30pm by Dr. Jadapalle.  Attending Physician will be Dr. Jadapalle.   Patient has been assigned to room 324, by [facility] Charge Nurse Aurora.    ER staff is aware of the admission: Luann, ER Secretary   Dr. Levander, ER MD  Leontine, Patient's Nurse  Curtistine, Patient Access.

## 2023-11-09 NOTE — ED Notes (Signed)
 Security present, pt belongings gathered, ED Tech present. Pt now ready to be escorted to BMU.

## 2023-11-09 NOTE — ED Notes (Signed)
 Matt, RN given report on this pt. Pt has sign and held orders in and is now awaiting discharge/readmission and transportation to BMU.

## 2023-11-09 NOTE — ED Notes (Signed)
 Assumed care of patient from triage. Pt alert, oriented and cooperative, no distress noted

## 2023-11-10 NOTE — Progress Notes (Signed)
   11/10/23 1000  Psych Admission Type (Psych Patients Only)  Admission Status Voluntary  Psychosocial Assessment  Patient Complaints Sleep disturbance  Eye Contact Brief  Facial Expression Flat  Affect Depressed  Speech Logical/coherent  Interaction Minimal  Motor Activity Slow  Appearance/Hygiene Unremarkable  Behavior Characteristics Cooperative  Mood Depressed;Pleasant  Thought Process  Coherency WDL  Content WDL  Delusions None reported or observed  Perception WDL  Hallucination None reported or observed  Judgment WDL  Confusion None  Danger to Self  Current suicidal ideation? Denies  Danger to Others  Danger to Others None reported or observed

## 2023-11-10 NOTE — Group Note (Signed)
 [facility] LCSW Group Therapy Note   Group Date: 11/10/2023 Start Time: 1300 End Time: 1450   Type of Therapy and Topic: Group Therapy: Avoiding Self-Sabotaging and Enabling Behaviors  Participation Level: Minimal  Mood: Flat  Description of Group:  In this group, patients will learn how to identify obstacles, self-sabotaging and enabling behaviors, as well as: what are they, why do we do them and what needs these behaviors meet. Discuss unhealthy relationships and how to have positive healthy boundaries with those that sabotage and enable. Explore aspects of self-sabotage and enabling in yourself and how to limit these self-destructive behaviors in everyday life.   Therapeutic Goals: 1. Patient will identify one obstacle that relates to self-sabotage and enabling behaviors 2. Patient will identify one personal self-sabotaging or enabling behavior they did prior to admission 3. Patient will state a plan to change the above identified behavior 4. Patient will demonstrate ability to communicate their needs through discussion and/or role play.    Summary of Patient Progress:   The patient explored distorted thinking patterns and gained insight into personal maladaptive thought processes that contributed to negative emotions and behaviors. As a group, members collaborated to identify strategies for challenging and reframing these negative thoughts to promote healthier emotional responses and behavioral outcomes. The patient was receptive to peer feedback, participated openly, and actively engaged both group members and facilitators in deeper discussions about cognitive distortions and self-sabotaging thought patterns.    Therapeutic Modalities:  Cognitive Behavioral Therapy Person-Centered Therapy Motivational Interviewing    Alveta CHRISTELLA Kerns, LCSW

## 2023-11-10 NOTE — Group Note (Signed)
 Date:  11/10/2023 Time:  8:50 PM  Group Topic/Focus:  Wrap-Up Group:   The focus of this group is to help patients review their daily goal of treatment and discuss progress on daily workbooks.    Participation Level:  Did Not Attend  Participation Quality:  none  Affect:  none  Cognitive:  none  Insight: None  Engagement in Group:  none  Modes of Intervention:  none  Additional Comments:  none   Kerri Katz 11/10/2023, 8:50 PM

## 2023-11-10 NOTE — BHH Counselor (Signed)
 Adult Comprehensive Assessment  Patient ID: Courtney Fenlon, male   DOB: 11/02/2000, 23 y.o.   MRN: [id number]  Information Source: Information source: Patient  Current Stressors:  Patient states their primary concerns and needs for treatment are:: Suicidal ideation. Pt reported that this has been ongoing for two years. Patient states their goals for this hospitilization and ongoing recovery are:: I want to be sent to a long-term place. Educational / Learning stressors: None reported Employment / Job issues: None reported Family Relationships: None reported Surveyor, quantity / Lack of resources (include bankruptcy): None reported Housing / Lack of housing: None reported Physical health (include injuries & life threatening diseases): None reported Social relationships: None reported. Substance abuse: He endorsed use of ketamine, cocaine, molly, and acid. Bereavement / Loss: None reported  Living/Environment/Situation:  Living Arrangements: Alone Living conditions (as described by patient or guardian): Pt reported that he lives in [place], [place]. Who else lives in the home?: By myself. How long has patient lived in current situation?: Like six months. What is atmosphere in current home: Comfortable, Other (Comment) (It was good.)  Family History:  Marital status: Single Are you sexually active?: Yes What is your sexual orientation?: Heterosexual Does patient have children?: No  Childhood History:  By whom was/is the patient raised?: Both parents Additional childhood history information: Pt reported that he was raised by both parents. Description of patient's relationship with caregiver when they were a child: Good Patient's description of current relationship with people who raised him/her: Not as good because of the drugs. How were you disciplined when you got in trouble as a child/adolescent?: Spanked. Does patient have siblings?: Yes Number of Siblings: 3 (One older  sister, and two younger sisters.) Description of patient's current relationship with siblings: It's ok. I don't talk to them that much. 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 Are You Satisfied With Your Job?: Yes Do You Work More Than One Job?: No Work Stressors: Pt denied any work stressors. Patient's Job has Been Impacted by Current Illness: Yes Describe how Patient's Job has Been Impacted: Yes substance use and mental health feed off each other. What is the Longest Time Patient has Held a Job?: Current job Where was the Patient Employed at that Time?: Current job Has Patient ever Been in Equities trader?: No  Financial Resources:   Financial resources: Medicaid Does patient have a Lawyer or guardian?: No  Alcohol/Substance Abuse:   What has been your use of drugs/alcohol within the last 12 months?: Pt reported using ketamine, cocaine, molly, and acid multiple times a week. If attempted suicide, did drugs/alcohol play a role in this?: No Alcohol/Substance Abuse Treatment Hx: Denies past history Has alcohol/substance abuse ever caused legal problems?: Yes (Possession charges)  Social Support System:   Patient's Community Support System: Good Describe Community Support System: My mom, that's about it. Type of faith/religion: Pt  denied any practices. How does patient's  faith help to cope with current illness?: Pt denied any practices.  Leisure/Recreation:   Do You Have Hobbies?: No  Strengths/Needs:   What is the patient's perception of their strengths?: Not really. Patient states these barriers may affect/interfere with their treatment: No barriers identified. Patient states these barriers may affect their return to the community: No barriers identified. Other important information patient would like considered in planning for their treatment: He stated that he wants to go somewhere long-term.  Discharge Plan:   Currently receiving community mental health services: Yes (From Whom) [facility] for medication management.) Patient states concerns and preferences for aftercare planning are: Pt shared that he is interested in residential substance use treatment. Patient states they will know when they are safe and ready for discharge when: I want to already be set up to go somewhere after this because if I return home to use then i'm not going. Does patient have access to transportation?: Yes Does patient have financial barriers related to discharge medications?: No Plan for living situation after discharge: Pt wants residential substance use treatment. Will patient be returning to same living situation after discharge?: No  Summary/Recommendations:   Summary and Recommendations (to be completed by the evaluator): Patient is a 23 year old, single, [place] [facility] He shared that he is in the hospital due to suicidal ideation. Pt reported that he has been experiencing this for the past two years. He stated his goal for hospitalization is to get placed in longer-term treatment. Pt reported that he lives by himself. He denied experiencing any stressors other than his suicidal ideation, depression, and continued substance use. Pt reported that he uses ketamine, cocaine, molly, and acid multiple times  weekly. Pt reported interest in going directly from here to the next place because he shared if he returns home, he will return to use. Pt denied any history of abuse in his childhood home or trauma.  He reported that he receives medication management through [facility] but denied any other outpatient treatment.  Recommendations include crisis stabilization, therapeutic milieu, encourage group attendance and participation, medication management for detox/mood stabilization and development of a comprehensive sobriety/mental wellness plan.  Nadara JONELLE Fam. 11/10/2023

## 2023-11-10 NOTE — BHH Suicide Risk Assessment (Signed)
 BHH INPATIENT:  Family/Significant Other Suicide Prevention Education  Suicide Prevention Education:  Patient Refusal for Family/Significant Other Suicide Prevention Education: The patient Edward Shelton has refused to provide written consent for family/significant other to be provided Family/Significant Other Suicide Prevention Education during admission and/or prior to discharge.  Physician notified.  SPE completed with pt, as pt refused to consent to family contact. SPI pamphlet provided to pt and pt was encouraged to share information with support network, ask questions, and talk about any concerns relating to SPE. Pt denies access to guns/firearms and verbalized understanding of information provided. Mobile Crisis information also provided to pt.  Nadara JONELLE Fam 11/10/2023, 10:29 AM

## 2023-11-10 NOTE — Group Note (Signed)
 Recreation Therapy Group Note   Group Topic:Goal Setting  Group Date: 11/10/2023 Start Time: 1000 End Time: 1055 Facilitators: Celestia Jeoffrey BRAVO, LRT, CTRS Location: Craft Room  Group Description: Product/process development scientist. Patients were given many different magazines, a glue stick, markers, and a piece of cardstock paper. LRT and pts discussed the importance of having goals in life. LRT and pts discussed the difference between short-term and long-term goals, as well as what a SMART goal is. LRT encouraged pts to create a vision board, with images they picked and then cut out with safety scissors from the magazine, for themselves, that capture their short and long-term goals. LRT encouraged pts to show and explain their vision board to the group.   Goal Area(s) Addressed:  Patient will gain knowledge of short vs. long term goals.  Patient will identify goals for themselves. Patient will practice setting SMART goals. Patient will verbalize their goals to LRT and peers.   Affect/Mood: N/A   Participation Level: Did not attend    Clinical Observations/Individualized Feedback: Patient did not attend group.   Plan: Continue to engage patient in RT group sessions 2-3x/week.   Jeoffrey BRAVO Celestia, LRT, CTRS 11/10/2023 1:12 PM

## 2023-11-10 NOTE — BHH Counselor (Signed)
 CSW contacted [facility] to see if they accept pt's medicaid and the length of their program. He was informed that they do accept pt's medicaid and that their program is 30 days. No other concerns expressed. Contact ended without incident.   CSW met pt briefly to provide update. He was informed that referral information would need to be sent over and that someone would contact him with information for screening. CSW did encourage pt to identify other programs he would like to apply to. He stated that there were not any others. CSW mentioned [facility] and encouraged him to review the list to have some other options. Pt stated if he did not get accepted to [facility] would he have to go home. CSW stated that he would have to go home if he did not have any program accept him. No other concerns expressed. Contact ended without incident.   CSW faxed over referral information to [facility] [phone]).  Nadara SAUNDERS. Chaim, MSW, LCSW, LCAS 11/10/2023 4:17 PM

## 2023-11-10 NOTE — Group Note (Deleted)
 LCSW Group Therapy Note  Group Date: 11/10/2023 Start Time: 1315 End Time: 1400   Type of Therapy and Topic:  Group Therapy: Positive Affirmations  Participation Level:  {BHH PARTICIPATION [id number]}   Description of Group:   This group addressed positive affirmation towards self and others.  Patients went around the room and identified two positive things about themselves and two positive things about a peer in the room.  Patients reflected on how it felt to share something positive with others, to identify positive things about themselves, and to hear positive things from others/ Patients were encouraged to have a daily reflection of positive characteristics or circumstances.   Therapeutic Goals: 1. Patients will verbalize two of their positive qualities 2. Patients will demonstrate empathy for others by stating two positive qualities about a peer in the group 3. Patients will verbalize their feelings when voicing positive self affirmations and when voicing positive affirmations of others 4. Patients will discuss the potential positive impact on their wellness/recovery of focusing on positive traits of self and others.  Summary of Patient Progress:  *** actively engaged in the discussion and . S*** was able***or not able to identify positive affirmations about ***self as well as other group members. Patient demonstrated *** insight into the subject matter, was respectful of peers, participated throughout the entire session.  Therapeutic Modalities:   Cognitive Behavioral Therapy Motivational Interviewing    Lum JONETTA Croft, [place] 11/10/2023  2:09 PM

## 2023-11-10 NOTE — Plan of Care (Signed)

## 2023-11-10 NOTE — H&P (Signed)
 Psychiatric Admission Assessment Adult  Patient Identification: Edward Shelton MRN:  [id number] Date of Evaluation:  11/10/2023 Chief Complaint:  MDD (major depressive disorder), recurrent severe, without psychosis (HCC) [F33.2]   History of Present Illness:  23 year old male with past psychiatric history of depression and polysubstance abuse presented to the emergency department with worsening depression and suicidal ideation, including plan to cut self with a knife, in the context of medication noncompliance and polysubstance use.  Past medications: Abilify  20 mg PO daily Prozac  40 mg PO daily  Labs and diagnostics on admission: Urine Drug Screen: Positive for cocaine Blood Alcohol Level: Negative Blood Glucose: 132 EKG: Within normal limits  Patient seen today for initial psychiatric evaluation. Upon approach, he is noted to be lying in bed, somewhat disheveled and disinterested overall.  Patient reports presenting due to worsening depression and suicidal ideation, including a plan to slash his throat prior to arrival. He attributes his decompensation to substance relapse, psychosocial stressors, and discontinuation of psychiatric medications. He reports new stressor of leaving his parents home.  He describes: Depressive symptoms: Anhedonia, sadness, low motivation, poor energy, low sleep, social withdrawal Suicidal ideation: Active over the past week; denies current SI or plan at this time Hallucinations: Reports AVH only in the context of drug use History of trauma: Denies any history of abuse, nightmares, or flashbacks Insight: Acknowledges pattern of stopping medications and worsening with drug use "I start to use drugs and don't take the medicine anymore and I get worse." Reports Abilify  and Prozac  were helpful when compliant  Psychiatric History 4 prior psychiatric admissions, most recently at [facility] 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; diagnosed with schizophrenia during intoxication History of suicidal overdose Reports history of AVH only when using substances  Social History Born in [place], [place] Raised by both parents Education: High school graduate Employment: Works as a Public affairs consultant (intermittent), currently unemployed Living Situation: Lives alone in a trailer in [place], [place] Relationship Status: Never married, no children, no significant other Legal: Facing charges for possession of ketamine and cocaine Spiritual: No affiliation reported Access to weapons: Denies  Substance Abuse History Cocaine: Uses occasionally, UDS positive Ketamine: Daily use, self-reported as drug of choice, snorts daily LSD, Molly: Past use Alcohol: 2-3x/week, 1-2 beers (16 oz); last use a few days ago Tobacco: Vapes nicotine  "every 5 minutes" Marijuana / Opiates: Denies Prescription Drug Abuse: Denies Withdrawal Seizures/DTs: Denies Rehab history: Denies  Mental Status Examination (MSE) Appearance: Disheveled, lying in bed Behavior: Disinterested, low engagement Eye Contact: Poor Speech: Low volume, non-spontaneous Mood: "Depressed" Affect: Flat Thought Process: Linear, goal-directed Thought Content: No delusions, paranoia, or perceptual disturbances reported Perception: Denies hallucinations outside substance use Cognition: Alert, oriented x4 Insight: Fair Judgment: Poor (related to substance relapse and medication nonadherence) Memory: Good Concentration: Good Attention: Good Recall: WNL Fund of Knowledge: Average Language: Intact Psychomotor: No agitation or retardation Sleep: Poor Appetite: Not discussed SI: Denies currently; endorsed recent suicidal thoughts with plan HI: Denies   Total Time spent with patient: 45 minutes  Sleep  Is the patient at risk to self? Yes.    Has the patient been a risk to self in the past 6 months? Yes.    Has the  patient been a risk to self within the distant past? Yes.    Is the patient a risk to others? No.  Has the patient been a risk to others in the past 6 months? No.  Has the  patient been a risk to others within the distant past? No.   [place] Scale:  Flowsheet Row Admission (Current) from 11/09/2023 in [facility] INPATIENT BEHAVIORAL MEDICINE Most recent reading at 11/09/2023  8:00 PM ED from 11/09/2023 in [facility] Emergency Department at [facility] Most recent reading at 11/09/2023  1:30 AM ED from 12/15/2022 in [facility] Emergency Department at [facility] Most recent reading at 12/15/2022 10:39 AM  C-SSRS RISK CATEGORY Low Risk Moderate Risk No Risk     Past Medical History:  Past Medical History:  Diagnosis Date   Depression    History reviewed. No pertinent surgical history. Family History:  Family History  Problem Relation Age of Onset   Healthy Mother    Healthy Father     Social History:  Social History   Substance and Sexual Activity  Alcohol Use Not Currently     Social History   Substance and Sexual Activity  Drug Use Yes   Types: Marijuana, IV   Comment: shrooms, MDMA, ketamine      Allergies:  No Known Allergies Lab Results:  Results for orders placed or performed during the hospital encounter of 11/09/23 (from the past 48 hours)  Ethanol     Status: None   Collection Time: 11/09/23  1:32 AM  Result Value Ref Range   Alcohol, Ethyl (B) <15 <15 mg/dL    Comment: (NOTE) For medical purposes only. Performed at [facility], [address] [place], [place] [address]   Comprehensive metabolic panel     Status: Abnormal   Collection Time: 11/09/23  1:35 AM  Result Value Ref Range   Sodium 139 135 - 145 mmol/L   Potassium 3.4 (L) 3.5 - 5.1 mmol/L   Chloride 103 98 - 111 mmol/L   CO2 25 22 - 32 mmol/L   Glucose, Bld 132 (H) 70 - 99 mg/dL    Comment: Glucose reference range applies only to samples taken after fasting for at least 8 hours.    BUN 14 6 - 20 mg/dL   Creatinine, Ser 9.27 0.61 - 1.24 mg/dL   Calcium 9.7 8.9 - 89.6 mg/dL   Total Protein 7.4 6.5 - 8.1 g/dL   Albumin 4.3 3.5 - 5.0 g/dL   AST 32 15 - 41 U/L   ALT 29 0 - 44 U/L   Alkaline Phosphatase 60 38 - 126 U/L   Total Bilirubin 1.2 0.0 - 1.2 mg/dL   GFR, Estimated >39 >39 mL/min    Comment: (NOTE) Calculated using the CKD-EPI Creatinine Equation (2021)    Anion gap 11 5 - 15    Comment: Performed at [facility], [address] [place], [place] [address]  cbc     Status: Abnormal   Collection Time: 11/09/23  1:35 AM  Result Value Ref Range   WBC 12.0 (H) 4.0 - 10.5 K/uL   RBC 4.77 4.22 - 5.81 MIL/uL   Hemoglobin 14.6 13.0 - 17.0 g/dL   HCT 59.4 60.9 - 47.9 %   MCV 84.9 80.0 - 100.0 fL   MCH 30.6 26.0 - 34.0 pg   MCHC 36.0 30.0 - 36.0 g/dL   RDW 86.2 88.4 - 84.4 %   Platelets 191 150 - 400 K/uL   nRBC 0.0 0.0 - 0.2 %    Comment: Performed at [facility], [address], [place], [place] [address]  Salicylate level     Status: Abnormal   Collection Time: 11/09/23  1:35 AM  Result Value  Ref Range   Salicylate Lvl <7.0 (L) 7.0 - 30.0 mg/dL    Comment: Performed at [facility], [address] [place], [place] [address]  Acetaminophen  level     Status: Abnormal   Collection Time: 11/09/23  1:35 AM  Result Value Ref Range   Acetaminophen  (Tylenol ), Serum <10 (L) 10 - 30 ug/mL    Comment: (NOTE) Therapeutic concentrations vary significantly. A range of 10-30 ug/mL  may be an effective concentration for many patients. However, some  are best treated at concentrations outside of this range. Acetaminophen  concentrations >150 ug/mL at 4 hours after ingestion  and >50 ug/mL at 12 hours after ingestion are often associated with  toxic reactions.  Performed at [facility], [address] [place], [place] [address]   Urine Drug Screen, Qualitative     Status: Abnormal   Collection Time: 11/09/23 11:36 AM   Result Value Ref Range   Tricyclic, Ur Screen NONE DETECTED NONE DETECTED   Amphetamines, Ur Screen NONE DETECTED NONE DETECTED   MDMA (Ecstasy)Ur Screen NONE DETECTED NONE DETECTED   Cocaine Metabolite,Ur [place] POSITIVE (A) NONE DETECTED   Opiate, Ur Screen NONE DETECTED NONE DETECTED   Phencyclidine (PCP) Ur S NONE DETECTED NONE DETECTED   Cannabinoid 50 Ng, Ur [place] NONE DETECTED NONE DETECTED   Barbiturates, Ur Screen NONE DETECTED NONE DETECTED   Benzodiazepine, Ur Scrn NONE DETECTED NONE DETECTED   Methadone Scn, Ur NONE DETECTED NONE DETECTED    Comment: (NOTE) Tricyclics + metabolites, urine    Cutoff 1000 ng/mL Amphetamines + metabolites, urine  Cutoff 1000 ng/mL MDMA (Ecstasy), urine              Cutoff 500 ng/mL Cocaine Metabolite, urine          Cutoff 300 ng/mL Opiate + metabolites, urine        Cutoff 300 ng/mL Phencyclidine (PCP), urine         Cutoff 25 ng/mL Cannabinoid, urine                 Cutoff 50 ng/mL Barbiturates + metabolites, urine  Cutoff 200 ng/mL Benzodiazepine, urine              Cutoff 200 ng/mL Methadone, urine                   Cutoff 300 ng/mL  The urine drug screen provides only a preliminary, unconfirmed analytical test result and should not be used for non-medical purposes. Clinical consideration and professional judgment should be applied to any positive drug screen result due to possible interfering substances. A more specific alternate chemical method must be used in order to obtain a confirmed analytical result. Gas chromatography / mass spectrometry (GC/MS) is the preferred confirm atory method. Performed at [facility], [address] [place], [place] [address]     Blood Alcohol level:  Lab Results  Component Value Date   [facility] <15 11/09/2023   ETH <10 11/03/2022    Metabolic Disorder Labs:  No results found for: HGBA1C, MPG No results found for: PROLACTIN No results found for: CHOL, TRIG, HDL, CHOLHDL,  VLDL, LDLCALC  Current Medications: Current Facility-Administered Medications  Medication Dose Route Frequency Provider Last Rate Last Admin   acetaminophen  (TYLENOL ) tablet 650 mg  650 mg Oral Q6H PRN Jadapalle, Sree, MD       alum & mag hydroxide-simeth (MAALOX/MYLANTA) 200-200-20 MG/5ML suspension 30 mL  30 mL Oral Q4H PRN Donnelly Mellow, MD  ARIPiprazole  (ABILIFY ) tablet 20 mg  20 mg Oral Daily Jadapalle, Sree, MD   20 mg at 11/10/23 9177   haloperidol  (HALDOL ) tablet 5 mg  5 mg Oral TID PRN Jadapalle, Sree, MD       And   diphenhydrAMINE  (BENADRYL ) capsule 50 mg  50 mg Oral TID PRN Jadapalle, Sree, MD       haloperidol  lactate (HALDOL ) injection 5 mg  5 mg Intramuscular TID PRN Jadapalle, Sree, MD       And   diphenhydrAMINE  (BENADRYL ) injection 50 mg  50 mg Intramuscular TID PRN Jadapalle, Sree, MD       And   LORazepam  (ATIVAN ) injection 2 mg  2 mg Intramuscular TID PRN Jadapalle, Sree, MD       haloperidol  lactate (HALDOL ) injection 10 mg  10 mg Intramuscular TID PRN Jadapalle, Sree, MD       And   diphenhydrAMINE  (BENADRYL ) injection 50 mg  50 mg Intramuscular TID PRN Jadapalle, Sree, MD       And   LORazepam  (ATIVAN ) injection 2 mg  2 mg Intramuscular TID PRN Jadapalle, Sree, MD       FLUoxetine  (PROZAC ) capsule 40 mg  40 mg Oral Daily Jadapalle, Sree, MD   40 mg at 11/10/23 9177   hydrOXYzine  (ATARAX ) tablet 25 mg  25 mg Oral Q6H PRN Jadapalle, Sree, MD       magnesium  hydroxide (MILK OF MAGNESIA) suspension 30 mL  30 mL Oral Daily PRN Donnelly Mellow, MD       nicotine  polacrilex (NICORETTE ) gum 2 mg  2 mg Oral PRN Jadapalle, Sree, MD       traZODone  (DESYREL ) tablet 50 mg  50 mg Oral QHS PRN Jadapalle, Sree, MD       PTA Medications: Medications Prior to Admission  Medication Sig Dispense Refill Last Dose/Taking   ARIPiprazole  (ABILIFY ) 20 MG tablet Take one tablet (20 mg dose) by mouth daily for 14 days.      DULoxetine  (CYMBALTA ) 30 MG capsule Take 30-60 mg by  mouth 2 (two) times daily. (Patient not taking: Reported on 11/09/2023)      FLUoxetine  (PROZAC ) 40 MG capsule Take one capsule (40 mg dose) by mouth daily for 14 days.      guanFACINE  (INTUNIV ) 1 MG TB24 ER tablet Take 1 mg by mouth every morning. (Patient not taking: Reported on 11/09/2023)      hydrOXYzine  (ATARAX ) 25 MG tablet SMARTSIG:0.5-1 Tablet(s) By Mouth 3 Times a Week (Patient not taking: Reported on 10/22/2021)      hydrOXYzine  (VISTARIL ) 25 MG capsule Take 25 mg by mouth every 6 (six) hours as needed. (Patient not taking: Reported on 11/09/2023)      mirtazapine  (REMERON ) 15 MG tablet Take 15 mg by mouth at bedtime.      OLANZapine  (ZYPREXA ) 2.5 MG tablet Take 2.5 mg by mouth at bedtime. (Patient not taking: Reported on 11/09/2023)      traZODone  (DESYREL ) 50 MG tablet Take 50 mg by mouth at bedtime. (Patient not taking: Reported on 11/09/2023)      venlafaxine  (EFFEXOR ) 37.5 MG tablet Take 37.5 mg by mouth daily. (Patient not taking: Reported on 11/09/2023)      Physical Exam: Physical Exam Constitutional:      Appearance: Normal appearance.  HENT:     Head: Normocephalic and atraumatic.  Musculoskeletal:     Cervical back: Normal range of motion.  Neurological:     Mental Status: He is alert.    ROS  Blood pressure (!) 93/59, pulse 72, temperature (!) 97.4 F (36.3 C), resp. rate 18, height 5' 3 (1.6 m), weight 58.5 kg, SpO2 100%. Body mass index is 22.85 kg/m.  Principal Diagnosis: MDD (major depressive disorder), recurrent severe, without psychosis (HCC) Diagnosis:  Principal Problem:   MDD (major depressive disorder), recurrent severe, without psychosis (HCC)   Clinical Decision Making: Patient presentation is meeting diagnostic criteria for Major Depressive Disorder, recurrent, moderate to severe, and Substance-Induced Mood Disorder with psychotic features, with polysubstance use as a primary driver of psychiatric instability. He reports that auditory and visual hallucinations  only occur in the context of intoxication, and he demonstrates insight into the relationship between drug use, medication noncompliance, and worsening mood and functioning. There is no current psychosis, but recent suicidal ideation, mood disturbance, and significant stressors make him appropriate for inpatient stabilization, medication management, and safety monitoring.  Safety and Monitoring:             -- Voluntary admission to inpatient psychiatric unit for safety, stabilization and treatment             -- Daily contact with patient to assess and evaluate symptoms and progress in treatment             -- Patient's case to be discussed in multi-disciplinary team meeting             -- Observation Level: q15 minute checks             -- Vital signs:  q12 hours             -- Precautions: suicide, elopement, and assault   2. 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 The patient consents to medication trial.                -- Metabolic profile and EKG monitoring obtained while on an atypical antipsychotic (BMI: Lipid Panel: HbgA1c: QTc:)              -- Encouraged patient to participate in unit milieu and in scheduled group therapies                            3. Medical Issues Being Addressed: no needs identified     4. Discharge Planning:              -- Social work and case management to assist with discharge planning and identification of hospital follow-up needs prior to discharge              -- Estimated LOS: 5-7 days             -- Discharge Concerns: Need to establish a safety plan; Medication compliance and effectiveness             -- Discharge Goals: Return home with outpatient referrals follow ups  Physician Treatment Plan for Primary Diagnosis: MDD (major depressive disorder), recurrent severe, without psychosis (HCC) Long Term Goal(s): Improvement in symptoms so as ready for discharge  Short Term  Goals: Ability to identify changes in lifestyle to reduce recurrence of condition will improve  Physician Treatment Plan for Secondary Diagnosis: Principal Problem:   MDD (major depressive disorder), recurrent severe, without psychosis (HCC)  Long Term Goal(s): Improvement in symptoms so as ready for discharge  Short Term Goals: Ability to identify changes in lifestyle to reduce recurrence of condition will improve  I certify that inpatient services furnished can reasonably be expected to improve the patient's condition.    Hoy CHRISTELLA Pinal, NP 8/7/20259:39 AM

## 2023-11-10 NOTE — BHH Counselor (Signed)
 Pt provided with resource list for residential substance use treatment for review.   Edward Shelton. Chaim, MSW, LCSW, LCAS 11/10/2023 11:16 AM

## 2023-11-10 NOTE — Plan of Care (Signed)
   Problem: Education: Goal: Emotional status will improve Outcome: Progressing Goal: Mental status will improve Outcome: Progressing

## 2023-11-10 NOTE — Group Note (Signed)
 Date:  11/10/2023 Time:  6:37 PM  Group Topic/Focus:  Goals Group:   The focus of this group is to help patients establish daily goals to achieve during treatment and discuss how the patient can incorporate goal setting into their daily lives to aide in recovery.    Participation Level:  Did Not Attend   Edward Shelton [facility] 11/10/2023, 6:37 PM

## 2023-11-11 NOTE — Progress Notes (Signed)
 Pt lying awake in bed, mood depressed and affect flat but denied depression and anxiety reporting that he is feeling good. When asked what has change regarding events leading to his admission, he states, "The medication that I am taking."  He denied SI/HI and AVH, withdrawn, and does not participate in unit activity.   11/10/23 2200  Psych Admission Type (Psych Patients Only)  Admission Status Voluntary  Psychosocial Assessment  Patient Complaints None  Eye Contact Brief  Facial Expression Flat  Affect Depressed  Speech Logical/coherent  Interaction Minimal;No initiation;Isolative  Motor Activity Other (Comment) (WDL)  Appearance/Hygiene Unremarkable;In scrubs  Behavior Characteristics Calm  Mood Depressed  Thought Process  Coherency WDL  Content WDL  Delusions None reported or observed  Perception WDL  Hallucination None reported or observed  Judgment WDL  Confusion None  Danger to Self  Current suicidal ideation? Denies  Agreement Not to Harm Self Yes  Description of Agreement Verbal  Danger to Others  Danger to Others None reported or observed    Problem: Education: Goal: Knowledge of [facility] Education information/materials will improve Outcome: Progressing Goal: Emotional status will improve Outcome: Progressing Goal: Mental status will improve Outcome: Progressing Goal: Verbalization of understanding the information provided will improve Outcome: Progressing   Problem: Activity: Goal: Interest or engagement in activities will improve Outcome: Progressing Goal: Sleeping patterns will improve Outcome: Progressing   Problem: Coping: Goal: Ability to verbalize frustrations and anger appropriately will improve Outcome: Progressing Goal: Ability to demonstrate self-control will improve Outcome: Progressing

## 2023-11-11 NOTE — Group Note (Signed)
 Date:  11/11/2023 Time:  8:50 PM  Group Topic/Focus:  Wrap-Up Group:   The focus of this group is to help patients review their daily goal of treatment and discuss progress on daily workbooks.    Participation Level:  Minimal  Participation Quality:  Appropriate  Affect:  Appropriate  Cognitive:  Alert  Insight: Appropriate  Engagement in Group:  Limited  Modes of Intervention:  Discussion  Additional Comments:     Maglione,Jalonda Antigua E 11/11/2023, 8:50 PM

## 2023-11-11 NOTE — BHH Counselor (Signed)
 CSW received call from [facility] [phone]). CSW was informed that they would not have a bed available until 8/22. CSW was asked if pt has anywhere else to go. They were informed that pt has his own home to return to but does not want to because he feels he will return to use. CSW informed them that pt would probably not be able to stay here until 8/22. CSW was informed that [facility] [phone]) could be tried instead and was given contact information. No other concerns expressed. Contact ended without incident.   Nadara SAUNDERS. Chaim, MSW, LCSW, LCAS 11/11/2023 3:28 PM

## 2023-11-11 NOTE — Progress Notes (Signed)
   11/11/23 1500  Psych Admission Type (Psych Patients Only)  Admission Status Voluntary  Psychosocial Assessment  Patient Complaints Anxiety;Depression (patient states it's always there.)  Eye Contact Fair  Facial Expression Flat  Affect Blunted  Speech Soft;Logical/coherent  Interaction Minimal;Isolative (patient isolated to room, except for meals and medication; however, he has been more present in the dayroom after lunch and did go outside to the courtyard for a little while.)  Motor Activity Slow  Appearance/Hygiene Unremarkable  Behavior Characteristics Cooperative;Calm  Mood Pleasant  Aggressive Behavior  Effect No apparent injury  Thought Process  Coherency WDL  Content WDL  Delusions None reported or observed  Perception WDL  Hallucination None reported or observed  Judgment WDL  Confusion None  Danger to Self  Current suicidal ideation? Denies  Agreement Not to Harm Self Yes  Description of Agreement Verbal  Danger to Others  Danger to Others None reported or observed   Patient's goal for today, per his self-inventory is groups.

## 2023-11-11 NOTE — Progress Notes (Signed)
 [facility] MD Progress Note  11/11/2023 11:17 AM Edward Shelton  MRN:  [id number]   Subjective:  Chart reviewed, case discussed in multidisciplinary meeting, patient seen during rounds.   Patient seen today for follow-up psychiatric evaluation. Upon approach, he is resting in his room and readily engages in brief interview. He denies suicidal ideation, homicidal ideation, auditory or visual hallucinations, paranoia, or other psychotic symptoms. Reports no changes in psychiatric symptoms and continues to express interest in entering substance abuse rehabilitation following discharge. Denies medication side effects. He shares goal of getting stabilized on medications and set out with good outpatient plan.   Patient remains mostly isolated to his room throughout the shift but is calm and cooperative when approached. Hygiene and grooming appear adequate. He is alert and oriented 3, with logical and goal-directed thought processes. Mood is described as "okay," with flat affect noted. No abnormal psychomotor activity observed. Insight and judgment remain fair.  Assessment: Patient continues to meet criteria for inpatient psychiatric care to support symptom stabilization and ensure safe discharge planning. He remains psychiatrically stable with no acute safety concerns but continues to require support and structured setting due to substance use history and lack of discharge placement. Patient will remain in the inpatient psychiatric setting for continued stabilization and discharge planning. Supportive therapy and monitoring will continue. Will continue encouraging socialization outside of his room. Team will maintain focus on identifying and coordinating appropriate rehab placement, which remains the patient's stated goal.   Sleep: Good  Appetite:  Fair  Past Psychiatric History: see h&P Family History:  Family History  Problem Relation Age of Onset   Healthy Mother    Healthy Father    Social  History:  Social History   Substance and Sexual Activity  Alcohol Use Not Currently     Social History   Substance and Sexual Activity  Drug Use Yes   Types: Marijuana, IV   Comment: shrooms, MDMA, ketamine    Social History   Socioeconomic History   Marital status: Single    Spouse name: Not on file   Number of children: Not on file   Years of education: Not on file   Highest education level: Not on file  Occupational History   Not on file  Tobacco Use   Smoking status: Every Day    Types: Cigarettes   Smokeless tobacco: Current  Vaping Use   Vaping status: Every Day  Substance and Sexual Activity   Alcohol use: Not Currently   Drug use: Yes    Types: Marijuana, IV    Comment: shrooms, MDMA, ketamine   Sexual activity: Yes  Other Topics Concern   Not on file  Social History Narrative   Not on file   Social Drivers of Health   Financial Resource Strain: Not on file  Food Insecurity: No Food Insecurity (11/09/2023)   Hunger Vital Sign    Worried About Running Out of Food in the Last Year: Never true    Ran Out of Food in the Last Year: Never true  Transportation Needs: No Transportation Needs (11/09/2023)   PRAPARE - Administrator, Civil Service (Medical): No    Lack of Transportation (Non-Medical): No  Physical Activity: Not on file  Stress: Not on file  Social Connections: Not on file   Past Medical History:  Past Medical History:  Diagnosis Date   Depression    History reviewed. No pertinent surgical history.  Current Medications: Current Facility-Administered Medications  Medication Dose Route  Frequency Provider Last Rate Last Admin   acetaminophen  (TYLENOL ) tablet 650 mg  650 mg Oral Q6H PRN Jadapalle, Sree, MD       alum & mag hydroxide-simeth (MAALOX/MYLANTA) 200-200-20 MG/5ML suspension 30 mL  30 mL Oral Q4H PRN Jadapalle, Sree, MD       ARIPiprazole  (ABILIFY ) tablet 20 mg  20 mg Oral Daily Jadapalle, Sree, MD   20 mg at 11/10/23 9177    haloperidol  (HALDOL ) tablet 5 mg  5 mg Oral TID PRN Jadapalle, Sree, MD       And   diphenhydrAMINE  (BENADRYL ) capsule 50 mg  50 mg Oral TID PRN Jadapalle, Sree, MD       haloperidol  lactate (HALDOL ) injection 5 mg  5 mg Intramuscular TID PRN Donnelly Mellow, MD       And   diphenhydrAMINE  (BENADRYL ) injection 50 mg  50 mg Intramuscular TID PRN Donnelly Mellow, MD       And   LORazepam  (ATIVAN ) injection 2 mg  2 mg Intramuscular TID PRN Donnelly Mellow, MD       haloperidol  lactate (HALDOL ) injection 10 mg  10 mg Intramuscular TID PRN Donnelly Mellow, MD       And   diphenhydrAMINE  (BENADRYL ) injection 50 mg  50 mg Intramuscular TID PRN Donnelly Mellow, MD       And   LORazepam  (ATIVAN ) injection 2 mg  2 mg Intramuscular TID PRN Jadapalle, Sree, MD       FLUoxetine  (PROZAC ) capsule 40 mg  40 mg Oral Daily Jadapalle, Sree, MD   40 mg at 11/10/23 9177   hydrOXYzine  (ATARAX ) tablet 25 mg  25 mg Oral Q6H PRN Donnelly Mellow, MD       magnesium  hydroxide (MILK OF MAGNESIA) suspension 30 mL  30 mL Oral Daily PRN Donnelly Mellow, MD       nicotine  polacrilex (NICORETTE ) gum 2 mg  2 mg Oral PRN Jadapalle, Sree, MD   2 mg at 11/10/23 1200   traZODone  (DESYREL ) tablet 50 mg  50 mg Oral QHS PRN Donnelly Mellow, MD        Lab Results:  Results for orders placed or performed during the hospital encounter of 11/09/23 (from the past 48 hours)  Urine Drug Screen, Qualitative     Status: Abnormal   Collection Time: 11/09/23 11:36 AM  Result Value Ref Range   Tricyclic, Ur Screen NONE DETECTED NONE DETECTED   Amphetamines, Ur Screen NONE DETECTED NONE DETECTED   MDMA (Ecstasy)Ur Screen NONE DETECTED NONE DETECTED   Cocaine Metabolite,Ur [place] POSITIVE (A) NONE DETECTED   Opiate, Ur Screen NONE DETECTED NONE DETECTED   Phencyclidine (PCP) Ur S NONE DETECTED NONE DETECTED   Cannabinoid 50 Ng, Ur [place] NONE DETECTED NONE DETECTED   Barbiturates, Ur Screen NONE DETECTED NONE DETECTED   Benzodiazepine, Ur  Scrn NONE DETECTED NONE DETECTED   Methadone Scn, Ur NONE DETECTED NONE DETECTED    Comment: (NOTE) Tricyclics + metabolites, urine    Cutoff 1000 ng/mL Amphetamines + metabolites, urine  Cutoff 1000 ng/mL MDMA (Ecstasy), urine              Cutoff 500 ng/mL Cocaine Metabolite, urine          Cutoff 300 ng/mL Opiate + metabolites, urine        Cutoff 300 ng/mL Phencyclidine (PCP), urine         Cutoff 25 ng/mL Cannabinoid, urine  Cutoff 50 ng/mL Barbiturates + metabolites, urine  Cutoff 200 ng/mL Benzodiazepine, urine              Cutoff 200 ng/mL Methadone, urine                   Cutoff 300 ng/mL  The urine drug screen provides only a preliminary, unconfirmed analytical test result and should not be used for non-medical purposes. Clinical consideration and professional judgment should be applied to any positive drug screen result due to possible interfering substances. A more specific alternate chemical method must be used in order to obtain a confirmed analytical result. Gas chromatography / mass spectrometry (GC/MS) is the preferred confirm atory method. Performed at [facility], [address] [place], [place] [address]     Blood Alcohol level:  Lab Results  Component Value Date   [facility] <15 11/09/2023   ETH <10 11/03/2022    Metabolic Disorder Labs: No results found for: HGBA1C, MPG No results found for: PROLACTIN No results found for: CHOL, TRIG, HDL, CHOLHDL, VLDL, LDLCALC     Psychiatric Specialty Exam: Appearance: Disheveled, lying in bed Behavior: Disinterested, low engagement Eye Contact: Poor Speech: Low volume, non-spontaneous Mood: "better" Affect: Flat Thought Process: Linear, goal-directed Thought Content: No delusions, paranoia, or perceptual disturbances reported Perception: Denies hallucinations outside substance use Cognition: Alert, oriented x4 Insight: Fair Judgment: Poor (related to substance  relapse and medication nonadherence) Memory: Good Concentration: Good Attention: Good Recall: WNL Fund of Knowledge: Average Language: Intact Psychomotor: No agitation or retardation Sleep: Poor Appetite: Not discussed SI: Denies currently; endorsed recent suicidal thoughts with plan HI: Denies   Musculoskeletal: Strength & Muscle Tone: within normal limits Gait & Station: normal Assets  Assets:No data recorded   Physical Exam: Physical Exam ROS Blood pressure 110/71, pulse 65, temperature 97.8 F (36.6 C), resp. rate 20, height 5' 3 (1.6 m), weight 58.5 kg, SpO2 100%. Body mass index is 22.85 kg/m.  Diagnosis: Principal Problem:   MDD (major depressive disorder), recurrent severe, without psychosis (HCC)   PLAN: Safety and Monitoring:  -- Voluntary admission to inpatient psychiatric unit for safety, stabilization and treatment  -- Daily contact with patient to assess and evaluate symptoms and progress in treatment  -- Patient's case to be discussed in multi-disciplinary team meeting  -- Observation Level : q15 minute checks  -- Vital signs:  q12 hours  -- Precautions: suicide, elopement, and assault -- Encouraged patient to participate in unit milieu and in scheduled group therapies   2. 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 The patient consents to medication trial.                -- Metabolic profile and EKG monitoring obtained while on an atypical antipsychotic  (BMI: Lipid Panel: HbgA1c: QTc:)              -- Encouraged patient to participate in unit milieu and in scheduled group therapies  3. Medical Issues Being Addressed: no needs identified    4. Discharge Planning:   -- Social work and case management to assist with discharge planning and identification of hospital follow-up needs prior to discharge  -- Estimated LOS: 3-4 days  Hoy CHRISTELLA [place], NP 11/11/2023, 11:17 AM

## 2023-11-11 NOTE — Plan of Care (Signed)
 Problem: Education: Goal: Knowledge of [facility] Education information/materials will improve Outcome: Progressing Goal: Emotional status will improve Outcome: Progressing Goal: Mental status will improve Outcome: Progressing Goal: Verbalization of understanding the information provided will improve Outcome: Progressing   Problem: Activity: Goal: Interest or engagement in activities will improve Outcome: Progressing Goal: Sleeping patterns will improve Outcome: Progressing   Problem: Coping: Goal: Ability to verbalize frustrations and anger appropriately will improve Outcome: Progressing Goal: Ability to demonstrate self-control will improve Outcome: Progressing   Problem: Health Behavior/Discharge Planning: Goal: Identification of resources available to assist in meeting health care needs will improve Outcome: Progressing Goal: Compliance with treatment plan for underlying cause of condition will improve Outcome: Progressing   Problem: Physical Regulation: Goal: Ability to maintain clinical measurements within normal limits will improve Outcome: Progressing   Problem: Safety: Goal: Periods of time without injury will increase Outcome: Progressing   Problem: Education: Goal: Knowledge of [facility] Education information/materials will improve Outcome: Progressing Goal: Emotional status will improve Outcome: Progressing Goal: Mental status will improve Outcome: Progressing Goal: Verbalization of understanding the information provided will improve Outcome: Progressing   Problem: Activity: Goal: Interest or engagement in activities will improve Outcome: Progressing Goal: Sleeping patterns will improve Outcome: Progressing   Problem: Coping: Goal: Ability to verbalize frustrations and anger appropriately will improve Outcome: Progressing Goal: Ability to demonstrate self-control will improve Outcome: Progressing   Problem: Health  Behavior/Discharge Planning: Goal: Identification of resources available to assist in meeting health care needs will improve Outcome: Progressing Goal: Compliance with treatment plan for underlying cause of condition will improve Outcome: Progressing   Problem: Physical Regulation: Goal: Ability to maintain clinical measurements within normal limits will improve Outcome: Progressing   Problem: Safety: Goal: Periods of time without injury will increase Outcome: Progressing   Problem: Education: Goal: Utilization of techniques to improve thought processes will improve Outcome: Progressing Goal: Knowledge of the prescribed therapeutic regimen will improve Outcome: Progressing   Problem: Activity: Goal: Interest or engagement in leisure activities will improve Outcome: Progressing Goal: Imbalance in normal sleep/wake cycle will improve Outcome: Progressing   Problem: Coping: Goal: Coping ability will improve Outcome: Progressing Goal: Will verbalize feelings Outcome: Progressing   Problem: Health Behavior/Discharge Planning: Goal: Ability to make decisions will improve Outcome: Progressing Goal: Compliance with therapeutic regimen will improve Outcome: Progressing   Problem: Role Relationship: Goal: Will demonstrate positive changes in social behaviors and relationships Outcome: Progressing   Problem: Safety: Goal: Ability to disclose and discuss suicidal ideas will improve Outcome: Progressing Goal: Ability to identify and utilize support systems that promote safety will improve Outcome: Progressing   Problem: Self-Concept: Goal: Will verbalize positive feelings about self Outcome: Progressing Goal: Level of anxiety will decrease Outcome: Progressing   Problem: Education: Goal: Ability to make informed decisions regarding treatment will improve Outcome: Progressing   Problem: Coping: Goal: Coping ability will improve Outcome: Progressing   Problem: Health  Behavior/Discharge Planning: Goal: Identification of resources available to assist in meeting health care needs will improve Outcome: Progressing   Problem: Medication: Goal: Compliance with prescribed medication regimen will improve Outcome: Progressing   Problem: Self-Concept: Goal: Ability to disclose and discuss suicidal ideas will improve Outcome: Progressing Goal: Will verbalize positive feelings about self Outcome: Progressing Note: Patient is initiating therapy. Patient will work on increased adherence    Problem: Education: Goal: Knowledge of disease or condition will improve Outcome: Progressing Goal: Understanding of discharge needs will improve Outcome: Progressing   Problem: Health Behavior/Discharge  Planning: Goal: Ability to identify changes in lifestyle to reduce recurrence of condition will improve Outcome: Progressing Goal: Identification of resources available to assist in meeting health care needs will improve Outcome: Progressing   Problem: Physical Regulation: Goal: Complications related to the disease process, condition or treatment will be avoided or minimized Outcome: Progressing

## 2023-11-11 NOTE — BHH Counselor (Signed)
 CSW sent over referral paperwork to [facility] (fax [phone]).   Nadara SAUNDERS. Chaim, MSW, LCSW, LCAS 11/11/2023 4:28 PM

## 2023-11-11 NOTE — Progress Notes (Signed)
 Patient was found in bed on approach. This Clinical research associate asked if patient would come up for scheduled morning medication. Patient stated that he would come up in a little while. Provider will be made aware.

## 2023-11-11 NOTE — Group Note (Signed)
 Recreation Therapy Group Note   Group Topic:Leisure Education  Group Date: 11/11/2023 Start Time: 1040 End Time: 1140 Facilitators: Celestia Jeoffrey BRAVO, LRT, CTRS Location: Craft Room  Group Description: Leisure. Patients were given the option to choose from singing karaoke, coloring mandalas, using oil pastels, journaling, painting or playing with play-doh. LRT and pts discussed the meaning of leisure, the importance of participating in leisure during their free time/when they're outside of the hospital, as well as how our leisure interests can also serve as coping skills.   Goal Area(s) Addressed:  Patient will identify a current leisure interest.  Patient will learn the definition of "leisure". Patient will practice making a positive decision. Patient will have the opportunity to try a new leisure activity. Patient will communicate with peers and LRT.    Affect/Mood: N/A   Participation Level: Did not attend    Clinical Observations/Individualized Feedback: Patient did not attend group.   Plan: Continue to engage patient in RT group sessions 2-3x/week.   Jeoffrey BRAVO Celestia, LRT, CTRS 11/11/2023 1:18 PM

## 2023-11-11 NOTE — BH IP Treatment Plan (Addendum)
 Interdisciplinary Treatment and Diagnostic Plan Update  11/11/2023 Time of Session: 10:25 Mithcell Schumpert MRN: [id number]  Principal Diagnosis: MDD (major depressive disorder), recurrent severe, without psychosis (HCC)  Secondary Diagnoses: Principal Problem:   MDD (major depressive disorder), recurrent severe, without psychosis (HCC)   Current Medications:  Current Facility-Administered Medications  Medication Dose Route Frequency Provider Last Rate Last Admin   acetaminophen  (TYLENOL ) tablet 650 mg  650 mg Oral Q6H PRN Jadapalle, Sree, MD       alum & mag hydroxide-simeth (MAALOX/MYLANTA) 200-200-20 MG/5ML suspension 30 mL  30 mL Oral Q4H PRN Jadapalle, Sree, MD       ARIPiprazole  (ABILIFY ) tablet 20 mg  20 mg Oral Daily Jadapalle, Sree, MD   20 mg at 11/10/23 9177   haloperidol  (HALDOL ) tablet 5 mg  5 mg Oral TID PRN Jadapalle, Sree, MD       And   diphenhydrAMINE  (BENADRYL ) capsule 50 mg  50 mg Oral TID PRN Jadapalle, Sree, MD       haloperidol  lactate (HALDOL ) injection 5 mg  5 mg Intramuscular TID PRN Jadapalle, Sree, MD       And   diphenhydrAMINE  (BENADRYL ) injection 50 mg  50 mg Intramuscular TID PRN Jadapalle, Sree, MD       And   LORazepam  (ATIVAN ) injection 2 mg  2 mg Intramuscular TID PRN Jadapalle, Sree, MD       haloperidol  lactate (HALDOL ) injection 10 mg  10 mg Intramuscular TID PRN Jadapalle, Sree, MD       And   diphenhydrAMINE  (BENADRYL ) injection 50 mg  50 mg Intramuscular TID PRN Jadapalle, Sree, MD       And   LORazepam  (ATIVAN ) injection 2 mg  2 mg Intramuscular TID PRN Jadapalle, Sree, MD       FLUoxetine  (PROZAC ) capsule 40 mg  40 mg Oral Daily Jadapalle, Sree, MD   40 mg at 11/10/23 9177   hydrOXYzine  (ATARAX ) tablet 25 mg  25 mg Oral Q6H PRN Jadapalle, Sree, MD       magnesium  hydroxide (MILK OF MAGNESIA) suspension 30 mL  30 mL Oral Daily PRN Jadapalle, Sree, MD       nicotine  polacrilex (NICORETTE ) gum 2 mg  2 mg Oral PRN Jadapalle, Sree, MD   2 mg at  11/10/23 1200   traZODone  (DESYREL ) tablet 50 mg  50 mg Oral QHS PRN Jadapalle, Sree, MD       PTA Medications: Medications Prior to Admission  Medication Sig Dispense Refill Last Dose/Taking   ARIPiprazole  (ABILIFY ) 20 MG tablet Take one tablet (20 mg dose) by mouth daily for 14 days.      DULoxetine  (CYMBALTA ) 30 MG capsule Take 30-60 mg by mouth 2 (two) times daily. (Patient not taking: Reported on 11/09/2023)      FLUoxetine  (PROZAC ) 40 MG capsule Take one capsule (40 mg dose) by mouth daily for 14 days.      guanFACINE  (INTUNIV ) 1 MG TB24 ER tablet Take 1 mg by mouth every morning. (Patient not taking: Reported on 11/09/2023)      hydrOXYzine  (ATARAX ) 25 MG tablet SMARTSIG:0.5-1 Tablet(s) By Mouth 3 Times a Week (Patient not taking: Reported on 10/22/2021)      hydrOXYzine  (VISTARIL ) 25 MG capsule Take 25 mg by mouth every 6 (six) hours as needed. (Patient not taking: Reported on 11/09/2023)      mirtazapine  (REMERON ) 15 MG tablet Take 15 mg by mouth at bedtime.      OLANZapine  (ZYPREXA ) 2.5 MG tablet  Take 2.5 mg by mouth at bedtime. (Patient not taking: Reported on 11/09/2023)      traZODone  (DESYREL ) 50 MG tablet Take 50 mg by mouth at bedtime. (Patient not taking: Reported on 11/09/2023)      venlafaxine  (EFFEXOR ) 37.5 MG tablet Take 37.5 mg by mouth daily. (Patient not taking: Reported on 11/09/2023)       Patient Stressors: Medication change or noncompliance   Substance abuse    Patient Strengths: General fund of knowledge  Motivation for treatment/growth   Treatment Modalities: Medication Management, Group therapy, Case management,  1 to 1 session with clinician, Psychoeducation, Recreational therapy.   Physician Treatment Plan for Primary Diagnosis: MDD (major depressive disorder), recurrent severe, without psychosis (HCC) Long Term Goal(s): Improvement in symptoms so as ready for discharge   Short Term Goals: Ability to identify changes in lifestyle to reduce recurrence of condition  will improve  Medication Management: Evaluate patient's response, side effects, and tolerance of medication regimen.  Therapeutic Interventions: 1 to 1 sessions, Unit Group sessions and Medication administration.  Evaluation of Outcomes: Progressing  Physician Treatment Plan for Secondary Diagnosis: Principal Problem:   MDD (major depressive disorder), recurrent severe, without psychosis (HCC)  Long Term Goal(s): Improvement in symptoms so as ready for discharge   Short Term Goals: Ability to identify changes in lifestyle to reduce recurrence of condition will improve     Medication Management: Evaluate patient's response, side effects, and tolerance of medication regimen.  Therapeutic Interventions: 1 to 1 sessions, Unit Group sessions and Medication administration.  Evaluation of Outcomes: Progressing   RN Treatment Plan for Primary Diagnosis: MDD (major depressive disorder), recurrent severe, without psychosis (HCC) Long Term Goal(s): Knowledge of disease and therapeutic regimen to maintain health will improve  Short Term Goals: Ability to remain free from injury will improve, Ability to verbalize frustration and anger appropriately will improve, Ability to demonstrate self-control, Ability to participate in decision making will improve, Ability to verbalize feelings will improve, Ability to disclose and discuss suicidal ideas, Ability to identify and develop effective coping behaviors will improve, and Compliance with prescribed medications will improve  Medication Management: RN will administer medications as ordered by provider, will assess and evaluate patient's response and provide education to patient for prescribed medication. RN will report any adverse and/or side effects to prescribing provider.  Therapeutic Interventions: 1 on 1 counseling sessions, Psychoeducation, Medication administration, Evaluate responses to treatment, Monitor vital signs and CBGs as ordered,  Perform/monitor CIWA, COWS, AIMS and Fall Risk screenings as ordered, Perform wound care treatments as ordered.  Evaluation of Outcomes: Progressing   LCSW Treatment Plan for Primary Diagnosis: MDD (major depressive disorder), recurrent severe, without psychosis (HCC) Long Term Goal(s): Safe transition to appropriate next level of care at discharge, Engage patient in therapeutic group addressing interpersonal concerns.  Short Term Goals: Engage patient in aftercare planning with referrals and resources, Increase social support, Increase ability to appropriately verbalize feelings, Increase emotional regulation, Facilitate acceptance of mental health diagnosis and concerns, Facilitate patient progression through stages of change regarding substance use diagnoses and concerns, Identify triggers associated with mental health/substance abuse issues, and Increase skills for wellness and recovery  Therapeutic Interventions: Assess for all discharge needs, 1 to 1 time with Social worker, Explore available resources and support systems, Assess for adequacy in community support network, Educate family and significant other(s) on suicide prevention, Complete Psychosocial Assessment, Interpersonal group therapy.  Evaluation of Outcomes: Progressing   Progress in Treatment: Attending groups: Yes. and No. Participating  in groups: Yes. Taking medication as prescribed: Yes. Toleration medication: Yes. Family/Significant other contact made: No, will contact:  if given permission.  Patient understands diagnosis: Yes. Discussing patient identified problems/goals with staff: Yes. Medical problems stabilized or resolved: Yes. Denies suicidal/homicidal ideation: Yes. Issues/concerns per patient self-inventory: No. Other: none.   New problem(s) identified: No, Describe:  none identified.  New Short Term/Long Term Goal(s): detox, elimination of symptoms of psychosis, medication management for mood  stabilization; elimination of SI thoughts; development of comprehensive mental wellness/sobriety plan.  Patient Goals:  Go to a rehab or something.   Discharge Plan or Barriers: CSW will assist pt with development of an appropriate aftercare/discharge plan.   Reason for Continuation of Hospitalization: Anxiety Depression Medication stabilization Suicidal ideation  Estimated Length of Stay: 1-7 days  Last 3 [place] Suicide Severity Risk Score: Flowsheet Row Admission (Current) from 11/09/2023 in [facility] INPATIENT BEHAVIORAL MEDICINE Most recent reading at 11/09/2023  8:00 PM ED from 11/09/2023 in [facility] Emergency Department [facility] Most recent reading at 11/09/2023  1:30 AM ED from 12/15/2022 in [facility] Emergency Department at [facility] Most recent reading at 12/15/2022 10:39 AM  C-SSRS RISK CATEGORY Low Risk Moderate Risk No Risk    Last PHQ 2/9 Scores:     No data to display          Scribe for Treatment Team: Nadara JONELLE Fam, LCSW 11/11/2023 10:53 AM

## 2023-11-11 NOTE — Group Note (Signed)
 Date:  11/11/2023 Time:  11:31 AM  Group Topic/Focus:  Goals Group:   The focus of this group is to help patients establish daily goals to achieve during treatment and discuss how the patient can incorporate goal setting into their daily lives to aide in recovery.  Participation Level:  Did Not Attend  Hammad Finkler A Pessy Delamar 11/11/2023, 11:31 AM

## 2023-11-12 NOTE — Group Note (Signed)
 Date:  11/12/2023 Time:  9:41 PM  Group Topic/Focus:  Recovery Goals:   The focus of this group is to identify appropriate goals for recovery and establish a plan to achieve them.    PT did not attend group.  Edward Shelton L 11/12/2023, 9:41 PM

## 2023-11-12 NOTE — Progress Notes (Signed)
   11/12/23 0400  Psych Admission Type (Psych Patients Only)  Admission Status Voluntary  Psychosocial Assessment  Patient Complaints Depression  Eye Contact Fair  Facial Expression Flat  Affect Blunted  Speech Logical/coherent;Soft  Interaction Minimal;Isolative  Motor Activity Slow  Appearance/Hygiene Unremarkable  Behavior Characteristics Cooperative;Calm  Mood Pleasant  Aggressive Behavior  Effect No apparent injury  Thought Process  Coherency WDL  Content WDL  Delusions None reported or observed  Perception WDL  Hallucination None reported or observed  Judgment WDL  Confusion None  Danger to Self  Current suicidal ideation? Denies  Agreement Not to Harm Self Yes  Danger to Others  Danger to Others None reported or observed

## 2023-11-12 NOTE — Progress Notes (Signed)
 [facility] MD Progress Note  11/12/2023 12:31 PM Edward Shelton  MRN:  [id number]   Subjective:  Chart reviewed, case discussed in multidisciplinary meeting, patient seen during rounds.   Patient seen today for follow-up psychiatric evaluation. Upon approach, he is resting in his room and readily engages in brief interview. He denies suicidal ideation, homicidal ideation, auditory or visual hallucinations, paranoia, or other psychotic symptoms. Reports no changes in psychiatric symptoms and continues to express interest in entering substance abuse rehabilitation following discharge. Denies medication side effects. He shares goal of getting stabilized on medications and set out with good outpatient plan.   Patient seen today for follow-up psychiatric evaluation. He requests discharge from this setting, stating his intent was solely to have his psychiatric medications restarted. We reviewed the history of his presentation, which included suicidal ideation and mood decompensation in the context of frequent relapse on substances, leading to poor follow-up with mental health care and medication nonadherence, and overall instability. He reiterates his desire to seek inpatient long-term substance abuse treatment and reports he has located a bed at a facility in approximately two weeks. In the meantime, he plans to return to living alone at home until admission to that program.  We discussed the limited support this plan provides, especially given his repeated relapses. Patient is considering the possibility of discharging to his parents' home for additional support. He reports tolerating medications well without side effects, and none are noted today. States mood is "okay."  Patient continues to show stabilization compared to admission, with improved grooming and engagement. While he denies acute psychiatric symptoms today, his ongoing substance use history, recent suicidal ideation, and limited interim supports  prior to planned inpatient rehab remain significant risk factors. Continued hospitalization is indicated until a safe and structured discharge plan is secured.  Plan: No changes to current psychiatric medications today. Continue current regimen, safety monitoring, and coordination with social work regarding discharge planning. Encourage consideration of staying with parents until inpatient rehab bed is available.   Sleep: Good  Appetite:  Fair  Past Psychiatric History: see h&P Family History:  Family History  Problem Relation Age of Onset   Healthy Mother    Healthy Father    Social History:  Social History   Substance and Sexual Activity  Alcohol Use Not Currently     Social History   Substance and Sexual Activity  Drug Use Yes   Types: Marijuana, IV   Comment: shrooms, MDMA, ketamine    Social History   Socioeconomic History   Marital status: Single    Spouse name: Not on file   Number of children: Not on file   Years of education: Not on file   Highest education level: Not on file  Occupational History   Not on file  Tobacco Use   Smoking status: Every Day    Types: Cigarettes   Smokeless tobacco: Current  Vaping Use   Vaping status: Every Day  Substance and Sexual Activity   Alcohol use: Not Currently   Drug use: Yes    Types: Marijuana, IV    Comment: shrooms, MDMA, ketamine   Sexual activity: Yes  Other Topics Concern   Not on file  Social History Narrative   Not on file   Social Drivers of Health   Financial Resource Strain: Not on file  Food Insecurity: No Food Insecurity (11/09/2023)   Hunger Vital Sign    Worried About Running Out of Food in the Last Year: Never true  Ran Out of Food in the Last Year: Never true  Transportation Needs: No Transportation Needs (11/09/2023)   PRAPARE - Administrator, Civil Service (Medical): No    Lack of Transportation (Non-Medical): No  Physical Activity: Not on file  Stress: Not on file   Social Connections: Not on file   Past Medical History:  Past Medical History:  Diagnosis Date   Depression    History reviewed. No pertinent surgical history.  Current Medications: Current Facility-Administered Medications  Medication Dose Route Frequency Provider Last Rate Last Admin   acetaminophen  (TYLENOL ) tablet 650 mg  650 mg Oral Q6H PRN Jadapalle, Sree, MD       alum & mag hydroxide-simeth (MAALOX/MYLANTA) 200-200-20 MG/5ML suspension 30 mL  30 mL Oral Q4H PRN Jadapalle, Sree, MD       ARIPiprazole  (ABILIFY ) tablet 20 mg  20 mg Oral Daily Jadapalle, Sree, MD   20 mg at 11/12/23 9188   haloperidol  (HALDOL ) tablet 5 mg  5 mg Oral TID PRN Jadapalle, Sree, MD       And   diphenhydrAMINE  (BENADRYL ) capsule 50 mg  50 mg Oral TID PRN Jadapalle, Sree, MD       haloperidol  lactate (HALDOL ) injection 5 mg  5 mg Intramuscular TID PRN Jadapalle, Sree, MD       And   diphenhydrAMINE  (BENADRYL ) injection 50 mg  50 mg Intramuscular TID PRN Jadapalle, Sree, MD       And   LORazepam  (ATIVAN ) injection 2 mg  2 mg Intramuscular TID PRN Jadapalle, Sree, MD       haloperidol  lactate (HALDOL ) injection 10 mg  10 mg Intramuscular TID PRN Jadapalle, Sree, MD       And   diphenhydrAMINE  (BENADRYL ) injection 50 mg  50 mg Intramuscular TID PRN Jadapalle, Sree, MD       And   LORazepam  (ATIVAN ) injection 2 mg  2 mg Intramuscular TID PRN Jadapalle, Sree, MD       FLUoxetine  (PROZAC ) capsule 40 mg  40 mg Oral Daily Jadapalle, Sree, MD   40 mg at 11/12/23 9189   hydrOXYzine  (ATARAX ) tablet 25 mg  25 mg Oral Q6H PRN Jadapalle, Sree, MD       magnesium  hydroxide (MILK OF MAGNESIA) suspension 30 mL  30 mL Oral Daily PRN Donnelly Mellow, MD       nicotine  polacrilex (NICORETTE ) gum 2 mg  2 mg Oral PRN Jadapalle, Sree, MD   2 mg at 11/12/23 1230   traZODone  (DESYREL ) tablet 50 mg  50 mg Oral QHS PRN Jadapalle, Sree, MD        Lab Results:  No results found for this or any previous visit (from the past 48  hours).   Blood Alcohol level:  Lab Results  Component Value Date   [facility] <15 11/09/2023   ETH <10 11/03/2022    Metabolic Disorder Labs: No results found for: HGBA1C, MPG No results found for: PROLACTIN No results found for: CHOL, TRIG, HDL, CHOLHDL, VLDL, LDLCALC     Psychiatric Specialty Exam: Appearance: Improved grooming, casually dressed, appropriate for setting Eye Contact: Good Behavior: Cooperative, better engagement than prior Speech: Clear, normal rate, rhythm, and volume Mood: "It's okay" Affect: Flat Thought Process: Linear, logical, and goal-directed Thought Content: Denies suicidal ideation, homicidal ideation, hallucinations, paranoia, or delusions Perception: No perceptual disturbances noted Cognition: Alert and oriented to person, place, time, and situation Memory: Intact recent and remote memory Concentration: Intact Recall: Intact Fund of Knowledge: Appropriate  for age and education Language: Fluent, coherent speech with appropriate word choice Insight: Fair Judgment: limited Sleep: good Appetite: good   Musculoskeletal: Strength & Muscle Tone: within normal limits Gait & Station: normal    Physical Exam: Physical Exam ROS Blood pressure 102/60, pulse (!) 58, temperature (!) 97.3 F (36.3 C), resp. rate 19, height 5' 3 (1.6 m), weight 58.5 kg, SpO2 99%. Body mass index is 22.85 kg/m.  Diagnosis: Principal Problem:   MDD (major depressive disorder), recurrent severe, without psychosis (HCC)   PLAN: Safety and Monitoring:  -- Voluntary admission to inpatient psychiatric unit for safety, stabilization and treatment  -- Daily contact with patient to assess and evaluate symptoms and progress in treatment  -- Patient's case to be discussed in multi-disciplinary team meeting  -- Observation Level : q15 minute checks  -- Vital signs:  q12 hours  -- Precautions: suicide, elopement, and assault -- Encouraged patient to  participate in unit milieu and in scheduled group therapies   2. 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 The patient consents to medication trial.                -- Metabolic profile and EKG monitoring obtained while on an atypical antipsychotic (BMI: Lipid Panel: HbgA1c: QTc:)              -- Encouraged patient to participate in unit milieu and in scheduled group therapies                            3. Medical Issues Being Addressed: no needs identified    4. Discharge Planning:   -- Social work and case management to assist with discharge planning and identification of hospital follow-up needs prior to discharge  -- Estimated LOS: 3-4 days  Hoy CHRISTELLA Pinal, NP 11/12/2023, 12:31 PM

## 2023-11-12 NOTE — Plan of Care (Signed)

## 2023-11-12 NOTE — Plan of Care (Signed)
   Problem: Education: Goal: Knowledge of [facility] Education information/materials will improve Outcome: Progressing Goal: Emotional status will improve Outcome: Progressing Goal: Mental status will improve Outcome: Progressing Goal: Verbalization of understanding the information provided will improve Outcome: Progressing

## 2023-11-12 NOTE — Progress Notes (Signed)
   11/12/23 1613  Psychosocial Assessment  Patient Complaints Anxiety;Depression  Eye Contact Fair  Facial Expression Flat  Affect Flat  Speech Logical/coherent  Interaction Assertive  Motor Activity Slow  Appearance/Hygiene Unremarkable  Behavior Characteristics Cooperative  Mood Pleasant;Anxious;Depressed  Aggressive Behavior  Effect No apparent injury  Thought Process  Coherency WDL  Content WDL  Delusions None reported or observed  Perception WDL  Hallucination None reported or observed  Judgment WDL  Confusion None  Danger to Self  Current suicidal ideation? Denies  Agreement Not to Harm Self Yes  Description of Agreement verbal  Danger to Others  Danger to Others None reported or observed

## 2023-11-13 NOTE — Plan of Care (Signed)
 Problem: Education: Goal: Knowledge of [facility] Education information/materials will improve Outcome: Progressing Goal: Emotional status will improve Outcome: Progressing Goal: Mental status will improve Outcome: Progressing Goal: Verbalization of understanding the information provided will improve Outcome: Progressing   Problem: Activity: Goal: Interest or engagement in activities will improve Outcome: Progressing Goal: Sleeping patterns will improve Outcome: Progressing   Problem: Coping: Goal: Ability to verbalize frustrations and anger appropriately will improve Outcome: Progressing Goal: Ability to demonstrate self-control will improve Outcome: Progressing   Problem: Health Behavior/Discharge Planning: Goal: Identification of resources available to assist in meeting health care needs will improve Outcome: Progressing Goal: Compliance with treatment plan for underlying cause of condition will improve Outcome: Progressing   Problem: Physical Regulation: Goal: Ability to maintain clinical measurements within normal limits will improve Outcome: Progressing   Problem: Safety: Goal: Periods of time without injury will increase Outcome: Progressing   Problem: Education: Goal: Knowledge of [place] [facility] Education information/materials will improve Outcome: Progressing Goal: Emotional status will improve Outcome: Progressing Goal: Mental status will improve Outcome: Progressing Goal: Verbalization of understanding the information provided will improve Outcome: Progressing   Problem: Activity: Goal: Interest or engagement in activities will improve Outcome: Progressing Goal: Sleeping patterns will improve Outcome: Progressing   Problem: Coping: Goal: Ability to verbalize frustrations and anger appropriately will improve Outcome: Progressing Goal: Ability to demonstrate self-control will improve Outcome: Progressing   Problem: Health  Behavior/Discharge Planning: Goal: Identification of resources available to assist in meeting health care needs will improve Outcome: Progressing Goal: Compliance with treatment plan for underlying cause of condition will improve Outcome: Progressing   Problem: Physical Regulation: Goal: Ability to maintain clinical measurements within normal limits will improve Outcome: Progressing   Problem: Safety: Goal: Periods of time without injury will increase Outcome: Progressing   Problem: Education: Goal: Utilization of techniques to improve thought processes will improve Outcome: Progressing Goal: Knowledge of the prescribed therapeutic regimen will improve Outcome: Progressing   Problem: Activity: Goal: Interest or engagement in leisure activities will improve Outcome: Progressing Goal: Imbalance in normal sleep/wake cycle will improve Outcome: Progressing   Problem: Coping: Goal: Coping ability will improve Outcome: Progressing Goal: Will verbalize feelings Outcome: Progressing   Problem: Health Behavior/Discharge Planning: Goal: Ability to make decisions will improve Outcome: Progressing Goal: Compliance with therapeutic regimen will improve Outcome: Progressing   Problem: Role Relationship: Goal: Will demonstrate positive changes in social behaviors and relationships Outcome: Progressing   Problem: Safety: Goal: Ability to disclose and discuss suicidal ideas will improve Outcome: Progressing Goal: Ability to identify and utilize support systems that promote safety will improve Outcome: Progressing   Problem: Self-Concept: Goal: Will verbalize positive feelings about self Outcome: Progressing Goal: Level of anxiety will decrease Outcome: Progressing   Problem: Education: Goal: Ability to make informed decisions regarding treatment will improve Outcome: Progressing   Problem: Coping: Goal: Coping ability will improve Outcome: Progressing   Problem: Health  Behavior/Discharge Planning: Goal: Identification of resources available to assist in meeting health care needs will improve Outcome: Progressing   Problem: Medication: Goal: Compliance with prescribed medication regimen will improve Outcome: Progressing   Problem: Self-Concept: Goal: Ability to disclose and discuss suicidal ideas will improve Outcome: Progressing Goal: Will verbalize positive feelings about self Outcome: Progressing Note: Patient is on track. Patient will work on increased adherence    Problem: Education: Goal: Knowledge of disease or condition will improve Outcome: Progressing Goal: Understanding of discharge needs will improve Outcome: Progressing   Problem: Health Behavior/Discharge  Planning: Goal: Ability to identify changes in lifestyle to reduce recurrence of condition will improve Outcome: Progressing Goal: Identification of resources available to assist in meeting health care needs will improve Outcome: Progressing   Problem: Physical Regulation: Goal: Complications related to the disease process, condition or treatment will be avoided or minimized Outcome: Progressing

## 2023-11-13 NOTE — Progress Notes (Signed)
 [facility] MD Progress Note  11/13/2023 11:55 AM Edward Shelton  MRN:  [id number]   Subjective:  Chart reviewed, case discussed in multidisciplinary meeting, patient seen during rounds.   Patient seen today for follow-up psychiatric evaluation. He reports depression is improved, denies anxiety, and states he is "fine." Reports eating and sleeping well. Denies suicidal ideation, homicidal ideation, auditory or visual hallucinations, paranoia, or other psychotic symptoms. Reports no changes in psychiatric symptoms and continues to express interest in entering substance abuse rehabilitation following discharge. Denies medication side effects. States his goal is to get stabilized on medications and leave with a good outpatient plan.  11/12/23: Patient seen today for follow-up psychiatric evaluation. Upon approach, he is resting in his room and readily engages in brief interview. He denies suicidal ideation, homicidal ideation, auditory or visual hallucinations, paranoia, or other psychotic symptoms. Reports no changes in psychiatric symptoms and continues to express interest in entering substance abuse rehabilitation following discharge. Denies medication side effects. He shares goal of getting stabilized on medications and set out with good outpatient plan.   11/11/23: Patient seen today for follow-up psychiatric evaluation. He requests discharge from this setting, stating his intent was solely to have his psychiatric medications restarted. We reviewed the history of his presentation, which included suicidal ideation and mood decompensation in the context of frequent relapse on substances, leading to poor follow-up with mental health care and medication nonadherence, and overall instability. He reiterates his desire to seek inpatient long-term substance abuse treatment and reports he has located a bed at a facility in approximately two weeks. In the meantime, he plans to return to living alone at home until  admission to that program. We discussed the limited support this plan provides, especially given his repeated relapses. Patient is considering the possibility of discharging to his parents' home for additional support. He reports tolerating medications well without side effects, and none are noted today. States mood is "okay."  Patient continues to show stabilization compared to admission, with improved grooming and engagement. While he denies acute psychiatric symptoms today, his ongoing substance use history, recent suicidal ideation, and limited interim supports prior to planned inpatient rehab remain significant risk factors. Continued hospitalization is indicated until a safe and structured discharge plan is secured.  Plan: No changes to current psychiatric medications today. Continue current regimen, safety monitoring, and coordination with social work regarding discharge planning. Encourage consideration of staying with parents until inpatient rehab bed is available.   Sleep: Good  Appetite:  Fair  Past Psychiatric History: see h&P Family History:  Family History  Problem Relation Age of Onset   Healthy Mother    Healthy Father    Social History:  Social History   Substance and Sexual Activity  Alcohol Use Not Currently     Social History   Substance and Sexual Activity  Drug Use Yes   Types: Marijuana, IV   Comment: shrooms, MDMA, ketamine    Social History   Socioeconomic History   Marital status: Single    Spouse name: Not on file   Number of children: Not on file   Years of education: Not on file   Highest education level: Not on file  Occupational History   Not on file  Tobacco Use   Smoking status: Every Day    Types: Cigarettes   Smokeless tobacco: Current  Vaping Use   Vaping status: Every Day  Substance and Sexual Activity   Alcohol use: Not Currently   Drug use: Yes  Types: Marijuana, IV    Comment: shrooms, MDMA, ketamine   Sexual activity:  Yes  Other Topics Concern   Not on file  Social History Narrative   Not on file   Social Drivers of Health   Financial Resource Strain: Not on file  Food Insecurity: No Food Insecurity (11/09/2023)   Hunger Vital Sign    Worried About Running Out of Food in the Last Year: Never true    Ran Out of Food in the Last Year: Never true  Transportation Needs: No Transportation Needs (11/09/2023)   PRAPARE - Administrator, Civil Service (Medical): No    Lack of Transportation (Non-Medical): No  Physical Activity: Not on file  Stress: Not on file  Social Connections: Not on file   Past Medical History:  Past Medical History:  Diagnosis Date   Depression    History reviewed. No pertinent surgical history.  Current Medications: Current Facility-Administered Medications  Medication Dose Route Frequency Provider Last Rate Last Admin   acetaminophen  (TYLENOL ) tablet 650 mg  650 mg Oral Q6H PRN Jadapalle, Sree, MD       alum & mag hydroxide-simeth (MAALOX/MYLANTA) 200-200-20 MG/5ML suspension 30 mL  30 mL Oral Q4H PRN Jadapalle, Sree, MD       ARIPiprazole  (ABILIFY ) tablet 20 mg  20 mg Oral Daily Jadapalle, Sree, MD   20 mg at 11/13/23 9160   haloperidol  (HALDOL ) tablet 5 mg  5 mg Oral TID PRN Jadapalle, Sree, MD       And   diphenhydrAMINE  (BENADRYL ) capsule 50 mg  50 mg Oral TID PRN Jadapalle, Sree, MD       haloperidol  lactate (HALDOL ) injection 5 mg  5 mg Intramuscular TID PRN Jadapalle, Sree, MD       And   diphenhydrAMINE  (BENADRYL ) injection 50 mg  50 mg Intramuscular TID PRN Donnelly Mellow, MD       And   LORazepam  (ATIVAN ) injection 2 mg  2 mg Intramuscular TID PRN Jadapalle, Sree, MD       haloperidol  lactate (HALDOL ) injection 10 mg  10 mg Intramuscular TID PRN Jadapalle, Sree, MD       And   diphenhydrAMINE  (BENADRYL ) injection 50 mg  50 mg Intramuscular TID PRN Jadapalle, Sree, MD       And   LORazepam  (ATIVAN ) injection 2 mg  2 mg Intramuscular TID PRN Jadapalle,  Sree, MD       FLUoxetine  (PROZAC ) capsule 40 mg  40 mg Oral Daily Jadapalle, Sree, MD   40 mg at 11/13/23 9160   hydrOXYzine  (ATARAX ) tablet 25 mg  25 mg Oral Q6H PRN Jadapalle, Sree, MD       magnesium  hydroxide (MILK OF MAGNESIA) suspension 30 mL  30 mL Oral Daily PRN Donnelly Mellow, MD       nicotine  polacrilex (NICORETTE ) gum 2 mg  2 mg Oral PRN Jadapalle, Sree, MD   2 mg at 11/13/23 9160   traZODone  (DESYREL ) tablet 50 mg  50 mg Oral QHS PRN Jadapalle, Sree, MD        Lab Results:  No results found for this or any previous visit (from the past 48 hours).   Blood Alcohol level:  Lab Results  Component Value Date   [facility] <15 11/09/2023   ETH <10 11/03/2022    Metabolic Disorder Labs: No results found for: HGBA1C, MPG No results found for: PROLACTIN No results found for: CHOL, TRIG, HDL, CHOLHDL, VLDL, LDLCALC     Psychiatric  Specialty Exam: Appearance: Present in milieu more frequently, calm Eye contact: Appropriate Attention: Sustained Psychomotor activity: Normal Speech: Normal rate, rhythm, and volume Mood: Improved Affect: Appropriate and congruent Thought process: Linear and goal-directed Thought content: No delusions, paranoia, or obsessions Perception: No hallucinations endorsed or observed Orientation: Fully oriented to person, place, time, and situation Memory: Intact Concentration: Intact Recall: Intact Fund of knowledge: Adequate Language: Fluent and appropriate Insight: Fair Judgment: Fair Sleep: Reports good sleep Appetite: Reports good appetite Suicidal thoughts: Denied Homicidal thoughts: Denied  Musculoskeletal: Strength & Muscle Tone: within normal limits Gait & Station: normal    Physical Exam: Physical Exam ROS Blood pressure (!) 92/58, pulse (!) 53, temperature (!) 97.2 F (36.2 C), resp. rate 18, height 5' 3 (1.6 m), weight 58.5 kg, SpO2 100%. Body mass index is 22.85 kg/m.  Diagnosis: Principal Problem:   MDD  (major depressive disorder), recurrent severe, without psychosis (HCC)   PLAN: Safety and Monitoring:  -- Voluntary admission to inpatient psychiatric unit for safety, stabilization and treatment  -- Daily contact with patient to assess and evaluate symptoms and progress in treatment  -- Patient's case to be discussed in multi-disciplinary team meeting  -- Observation Level : q15 minute checks  -- Vital signs:  q12 hours  -- Precautions: suicide, elopement, and assault -- Encouraged patient to participate in unit milieu and in scheduled group therapies   2. 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 The patient consents to medication trial.                -- Metabolic profile and EKG monitoring obtained while on an atypical antipsychotic (BMI: Lipid Panel: HbgA1c: QTc:)              -- Encouraged patient to participate in unit milieu and in scheduled group therapies                            3. Medical Issues Being Addressed: no needs identified    4. Discharge Planning:   -- Social work and case management to assist with discharge planning and identification of hospital follow-up needs prior to discharge  -- Estimated LOS: 3-4 days  Hoy CHRISTELLA Pinal, NP 11/13/2023, 11:55 AM

## 2023-11-13 NOTE — Progress Notes (Signed)
   11/13/23 0906  Psychosocial Assessment  Patient Complaints None  Eye Contact Fair  Facial Expression Flat  Affect Flat  Speech Soft  Interaction Minimal  Motor Activity Slow  Appearance/Hygiene Unremarkable  Behavior Characteristics Cooperative  Mood Pleasant  Aggressive Behavior  Effect No apparent injury  Thought Process  Coherency WDL  Content WDL  Delusions None reported or observed  Perception WDL  Hallucination None reported or observed  Judgment WDL  Confusion None  Danger to Self  Current suicidal ideation? Denies  Agreement Not to Harm Self Yes  Description of Agreement verbal  Danger to Others  Danger to Others None reported or observed

## 2023-11-13 NOTE — Group Note (Signed)
 Date:  11/13/2023 Time:  5:00 PM  Group Topic/Focus:  Activity Group: The focus of the group is to promote activity for the patients and to encourage them to go outside to the courtyard for some fresh air and some exercise.    Participation Level:  Did Not Attend   [name] HERO Edward Shelton 11/13/2023, 5:00 PM

## 2023-11-13 NOTE — Plan of Care (Signed)
  Problem: Education: Goal: Emotional status will improve Outcome: Progressing Goal: Mental status will improve Outcome: Progressing Goal: Verbalization of understanding the information provided will improve Outcome: Progressing   Problem: Activity: Goal: Interest or engagement in activities will improve Outcome: Progressing Goal: Sleeping patterns will improve Outcome: Progressing   Problem: Coping: Goal: Ability to verbalize frustrations and anger appropriately will improve Outcome: Progressing Goal: Ability to demonstrate self-control will improve Outcome: Progressing   Problem: Health Behavior/Discharge Planning: Goal: Compliance with treatment plan for underlying cause of condition will improve Outcome: Not Progressing

## 2023-11-13 NOTE — Group Note (Signed)
 Date:  11/13/2023 Time:  9:31 PM  Group Topic/Focus:  Wrap-Up Group:   The focus of this group is to help patients review their daily goal of treatment and discuss progress on daily workbooks.    Participation Level:  Did Not Attend   Larrie Leita BRAVO 11/13/2023, 9:31 PM

## 2023-11-14 DIAGNOSIS — F332 Major depressive disorder, recurrent severe without psychotic features: Secondary | ICD-10-CM | POA: Diagnosis not present

## 2023-11-14 NOTE — Progress Notes (Signed)
   11/14/23 1157  Psych Admission Type (Psych Patients Only)  Admission Status Voluntary  Psychosocial Assessment  Patient Complaints None  Eye Contact Fair  Facial Expression Flat  Affect Flat  Speech Soft  Interaction Minimal  Motor Activity Slow  Appearance/Hygiene Unremarkable  Behavior Characteristics Cooperative  Mood Pleasant  Aggressive Behavior  Effect No apparent injury  Thought Process  Coherency WDL  Content WDL  Delusions None reported or observed  Perception WDL  Hallucination None reported or observed  Judgment WDL  Confusion None  Danger to Self  Current suicidal ideation? Denies  Agreement Not to Harm Self Yes  Description of Agreement verbal  Danger to Others  Danger to Others None reported or observed

## 2023-11-14 NOTE — Group Note (Signed)
 Recreation Therapy Group Note   Group Topic:Coping Skills  Group Date: 11/14/2023 Start Time: 1530 End Time: 1550 Facilitators: Celestia Jeoffrey FORBES ARTICE, CTRS Location: Craft Room  Group Description: Mind Map.  Patient was provided a blank template of a diagram with 32 blank boxes in a tiered system, branching from the center (similar to a bubble chart). LRT directed patients to label the middle of the diagram Coping Skills. LRT and patients then came up with 8 different coping skills as examples. Pt were directed to record their coping skills in the 2nd tier boxes closest to the center.  Patients would then share their coping skills with the group as LRT wrote them out. LRT gave a handout of 99 different coping skills at the end of group.   Goal Area(s) Addressed: Patients will be able to define "coping skills". Patient will identify new coping skills.  Patient will increase communication.   Affect/Mood: Euthymic   Participation Level: Moderate   Participation Quality: Minimal Cues   Behavior: Cooperative   Speech/Thought Process: Coherent   Insight: Fair   Judgement: Limited   Modes of Intervention: Education, Worksheet, and Writing   Patient Response to Interventions:  Receptive   Education Outcome:  Acknowledges education   Clinical Observations/Individualized Feedback: Edward Shelton was mostly active in their participation of session activities and group discussion. Pt identified Do you have any candy? Pt also shared stories of when he was doing drugs and required redirection.    Plan: Continue to engage patient in RT group sessions 2-3x/week.   Jeoffrey FORBES Celestia, LRT, CTRS 11/14/2023 5:33 PM

## 2023-11-14 NOTE — Group Note (Signed)
 Recreation Therapy Group Note   Group Topic:Health and Wellness  Group Date: 11/14/2023 Start Time: 1040 End Time: 1140 Facilitators: Celestia Jeoffrey BRAVO, LRT, CTRS Location: Courtyard  Group Description: [employer]. LRT and patients played games of basketball, drew with chalk, and played corn hole while outside in the courtyard while getting fresh air and sunlight. Music was being played in the background. LRT and peers conversed about different games they have played before, what they do in their free time and anything else that is on their minds. LRT encouraged pts to drink water after being outside, sweating and getting their heart rate up.  Goal Area(s) Addressed: Patient will build on frustration tolerance skills. Patients will partake in a competitive play game with peers. Patients will gain knowledge of new leisure interest/hobby.    Affect/Mood: Appropriate   Participation Level: Active   Participation Quality: Independent   Behavior: Appropriate   Speech/Thought Process: Coherent   Insight: Fair   Judgement: Fair    Modes of Intervention: Activity   Patient Response to Interventions:  Receptive   Education Outcome:  In group clarification offered    Clinical Observations/Individualized Feedback: Patrice was active in their participation of session activities and group discussion. Pt interacted well with LRT and peers duration of session.    Plan: Continue to engage patient in RT group sessions 2-3x/week.   Jeoffrey BRAVO Celestia, LRT, CTRS 11/14/2023 1:22 PM

## 2023-11-14 NOTE — Group Note (Signed)
 [facility] LCSW Group Therapy Note    Group Date: 11/14/2023 Start Time: 1300 End Time: 1400  Type of Therapy and Topic:  Group Therapy:  Overcoming Obstacles  Participation Level:  [facility] PARTICIPATION LEVEL: Did Not Attend  Mood:  Description of Group:   In this group patients will be encouraged to explore what they see as obstacles to their own wellness and recovery. They will be guided to discuss their thoughts, feelings, and behaviors related to these obstacles. The group will process together ways to cope with barriers, with attention given to specific choices patients can make. Each patient will be challenged to identify changes they are motivated to make in order to overcome their obstacles. This group will be process-oriented, with patients participating in exploration of their own experiences as well as giving and receiving support and challenge from other group members.  Therapeutic Goals: 1. Patient will identify personal and current obstacles as they relate to admission. 2. Patient will identify barriers that currently interfere with their wellness or overcoming obstacles.  3. Patient will identify feelings, thought process and behaviors related to these barriers. 4. Patient will identify two changes they are willing to make to overcome these obstacles:    Summary of Patient Progress   X   Therapeutic Modalities:   Cognitive Behavioral Therapy Solution Focused Therapy Motivational Interviewing Relapse Prevention Therapy   Sherryle JINNY Margo, LCSW

## 2023-11-14 NOTE — Progress Notes (Signed)
   11/13/23 2000  Psychosocial Assessment  Patient Complaints None  Eye Contact Fair;Brief  Facial Expression Flat  Affect Flat  Speech Soft  Interaction Minimal  Motor Activity Tremors  Appearance/Hygiene Unremarkable  Behavior Characteristics Cooperative  Mood Pleasant  Aggressive Behavior  Effect No apparent injury  Thought Process  Coherency WDL  Content WDL  Delusions WDL  Perception WDL  Hallucination None reported or observed  Judgment WDL  Confusion WDL  Danger to Self  Current suicidal ideation? Denies  Agreement Not to Harm Self Yes  Danger to Others  Danger to Others None reported or observed

## 2023-11-14 NOTE — Group Note (Signed)
 Date:  11/14/2023 Time:  9:28 PM  Group Topic/Focus:  Personal Choices and Values:   The focus of this group is to help patients assess and explore the importance of values in their lives, how their values affect their decisions, how they express their values and what opposes their expression.   PT did not attend group.  Slade Pierpoint L 11/14/2023, 9:28 PM

## 2023-11-14 NOTE — Plan of Care (Signed)

## 2023-11-14 NOTE — Progress Notes (Signed)
 [facility] MD Progress Note  11/14/2023 9:04 AM Edward Shelton  MRN:  [id number]   Subjective:  Chart reviewed, case discussed in multidisciplinary meeting, patient seen during rounds.   8/11 Patient seen for follow up. They are alert and oriented. They are pleasant and cooperative on exam. They report med compliance and demonstrate good insight into the need for outpatient follow up. Is going to follow up at [facility]. They deny SI/hi/avh. They are linear, logical, and future oriented on exam. They report stable appetite, mood, and sleep. No changes in psychiatric symptoms since last f/u. Report they live a lone and have steady employment. They also feel supported by family, and continue to be interested in substance use treatment. They voice no concerns or complaints at this time. Feels they have met their goal of getting started back on medications and are looking forward to discharge. No med adjustments. No behavioral PRNS over night.   8/10 Patient seen today for follow-up psychiatric evaluation. He reports depression is improved, denies anxiety, and states he is "fine." Reports eating and sleeping well. Denies suicidal ideation, homicidal ideation, auditory or visual hallucinations, paranoia, or other psychotic symptoms. Reports no changes in psychiatric symptoms and continues to express interest in entering substance abuse rehabilitation following discharge. Denies medication side effects. States his goal is to get stabilized on medications and leave with a good outpatient plan.  11/12/23: Patient seen today for follow-up psychiatric evaluation. Upon approach, he is resting in his room and readily engages in brief interview. He denies suicidal ideation, homicidal ideation, auditory or visual hallucinations, paranoia, or other psychotic symptoms. Reports no changes in psychiatric symptoms and continues to express interest in entering substance abuse rehabilitation following discharge. Denies medication  side effects. He shares goal of getting stabilized on medications and set out with good outpatient plan.   11/11/23: Patient seen today for follow-up psychiatric evaluation. He requests discharge from this setting, stating his intent was solely to have his psychiatric medications restarted. We reviewed the history of his presentation, which included suicidal ideation and mood decompensation in the context of frequent relapse on substances, leading to poor follow-up with mental health care and medication nonadherence, and overall instability. He reiterates his desire to seek inpatient long-term substance abuse treatment and reports he has located a bed at a facility in approximately two weeks. In the meantime, he plans to return to living alone at home until admission to that program. We discussed the limited support this plan provides, especially given his repeated relapses. Patient is considering the possibility of discharging to his parents' home for additional support. He reports tolerating medications well without side effects, and none are noted today. States mood is "okay."  Patient continues to show stabilization compared to admission, with improved grooming and engagement. While he denies acute psychiatric symptoms today, his ongoing substance use history, recent suicidal ideation, and limited interim supports prior to planned inpatient rehab remain significant risk factors. Continued hospitalization is indicated until a safe and structured discharge plan is secured.  Plan: No changes to current psychiatric medications today. Continue current regimen, safety monitoring, and coordination with social work regarding discharge planning. Encourage consideration of staying with parents until inpatient rehab bed is available.   Sleep: Good  Appetite:  Fair  Past Psychiatric History: see h&P Family History:  Family History  Problem Relation Age of Onset   Healthy Mother    Healthy Father     Social History:  Social History   Substance and Sexual Activity  Alcohol Use Not Currently     Social History   Substance and Sexual Activity  Drug Use Yes   Types: Marijuana, IV   Comment: shrooms, MDMA, ketamine    Social History   Socioeconomic History   Marital status: Single    Spouse name: Not on file   Number of children: Not on file   Years of education: Not on file   Highest education level: Not on file  Occupational History   Not on file  Tobacco Use   Smoking status: Every Day    Types: Cigarettes   Smokeless tobacco: Current  Vaping Use   Vaping status: Every Day  Substance and Sexual Activity   Alcohol use: Not Currently   Drug use: Yes    Types: Marijuana, IV    Comment: shrooms, MDMA, ketamine   Sexual activity: Yes  Other Topics Concern   Not on file  Social History Narrative   Not on file   Social Drivers of Health   Financial Resource Strain: Not on file  Food Insecurity: No Food Insecurity (11/09/2023)   Hunger Vital Sign    Worried About Running Out of Food in the Last Year: Never true    Ran Out of Food in the Last Year: Never true  Transportation Needs: No Transportation Needs (11/09/2023)   PRAPARE - Administrator, Civil Service (Medical): No    Lack of Transportation (Non-Medical): No  Physical Activity: Not on file  Stress: Not on file  Social Connections: Not on file   Past Medical History:  Past Medical History:  Diagnosis Date   Depression    History reviewed. No pertinent surgical history.  Current Medications: Current Facility-Administered Medications  Medication Dose Route Frequency Provider Last Rate Last Admin   acetaminophen  (TYLENOL ) tablet 650 mg  650 mg Oral Q6H PRN Donnelly Mellow, MD       alum & mag hydroxide-simeth (MAALOX/MYLANTA) 200-200-20 MG/5ML suspension 30 mL  30 mL Oral Q4H PRN Jadapalle, Sree, MD       ARIPiprazole  (ABILIFY ) tablet 20 mg  20 mg Oral Daily Jadapalle, Sree, MD   20 mg at  11/14/23 0757   haloperidol  (HALDOL ) tablet 5 mg  5 mg Oral TID PRN Jadapalle, Sree, MD       And   diphenhydrAMINE  (BENADRYL ) capsule 50 mg  50 mg Oral TID PRN Jadapalle, Sree, MD       haloperidol  lactate (HALDOL ) injection 5 mg  5 mg Intramuscular TID PRN Jadapalle, Sree, MD       And   diphenhydrAMINE  (BENADRYL ) injection 50 mg  50 mg Intramuscular TID PRN Donnelly Mellow, MD       And   LORazepam  (ATIVAN ) injection 2 mg  2 mg Intramuscular TID PRN Jadapalle, Sree, MD       haloperidol  lactate (HALDOL ) injection 10 mg  10 mg Intramuscular TID PRN Jadapalle, Sree, MD       And   diphenhydrAMINE  (BENADRYL ) injection 50 mg  50 mg Intramuscular TID PRN Jadapalle, Sree, MD       And   LORazepam  (ATIVAN ) injection 2 mg  2 mg Intramuscular TID PRN Jadapalle, Sree, MD       FLUoxetine  (PROZAC ) capsule 40 mg  40 mg Oral Daily Jadapalle, Sree, MD   40 mg at 11/14/23 0757   hydrOXYzine  (ATARAX ) tablet 25 mg  25 mg Oral Q6H PRN Jadapalle, Sree, MD       magnesium  hydroxide (MILK OF MAGNESIA) suspension 30  mL  30 mL Oral Daily PRN Donnelly Mellow, MD       nicotine  polacrilex (NICORETTE ) gum 2 mg  2 mg Oral PRN Jadapalle, Sree, MD   2 mg at 11/13/23 9160   traZODone  (DESYREL ) tablet 50 mg  50 mg Oral QHS PRN Jadapalle, Sree, MD        Lab Results:  No results found for this or any previous visit (from the past 48 hours).   Blood Alcohol level:  Lab Results  Component Value Date   [facility] <15 11/09/2023   ETH <10 11/03/2022    Metabolic Disorder Labs: No results found for: HGBA1C, MPG No results found for: PROLACTIN No results found for: CHOL, TRIG, HDL, CHOLHDL, VLDL, LDLCALC     Psychiatric Specialty Exam: Appearance: Present in milieu more frequently, calm Eye contact: Appropriate Attention: Sustained Psychomotor activity: Normal Speech: Normal rate, rhythm, and volume Mood: Improved Affect: Appropriate and congruent Thought process: Linear and  goal-directed Thought content: No delusions, paranoia, or obsessions Perception: No hallucinations endorsed or observed Orientation: Fully oriented to person, place, time, and situation Memory: Intact Concentration: Intact Recall: Intact Fund of knowledge: Adequate Language: Fluent and appropriate Insight: Fair Judgment: Fair Sleep: Reports good sleep Appetite: Reports good appetite Suicidal thoughts: Denied Homicidal thoughts: Denied  Musculoskeletal: Strength & Muscle Tone: within normal limits Gait & Station: normal    Physical Exam: Physical Exam Vitals and nursing note reviewed.  HENT:     Head: Atraumatic.  Eyes:     Extraocular Movements: Extraocular movements intact.  Pulmonary:     Effort: Pulmonary effort is normal.  Neurological:     Mental Status: He is alert and oriented to person, place, and time.    Review of Systems  Psychiatric/Behavioral:  Negative for hallucinations, substance abuse and suicidal ideas. The patient is not nervous/anxious and does not have insomnia.    Blood pressure 104/67, pulse (!) 55, temperature 97.8 F (36.6 C), resp. rate 18, height 5' 3 (1.6 m), weight 58.5 kg, SpO2 100%. Body mass index is 22.85 kg/m.  Diagnosis: Principal Problem:   MDD (major depressive disorder), recurrent severe, without psychosis (HCC)   PLAN: Safety and Monitoring:  -- Voluntary admission to inpatient psychiatric unit for safety, stabilization and treatment  -- Daily contact with patient to assess and evaluate symptoms and progress in treatment  -- Patient's case to be discussed in multi-disciplinary team meeting  -- Observation Level : q15 minute checks  -- Vital signs:  q12 hours  -- Precautions: suicide, elopement, and assault -- Encouraged patient to participate in unit milieu and in scheduled group therapies   2. 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 The patient consents to medication trial.                -- Metabolic profile and EKG monitoring obtained while on an atypical antipsychotic (BMI: Lipid Panel: HbgA1c: QTc:)              -- Encouraged patient to participate in unit milieu and in scheduled group therapies  3. Medical Issues Being Addressed: no needs identified    4. Discharge Planning:   -- Social work and case management to assist with discharge planning and identification of hospital follow-up needs prior to discharge  -- Estimated LOS: 3-4 days  Donnice FORBES Right, PA-C 11/14/2023, 9:04 AM

## 2023-11-14 NOTE — Group Note (Signed)
 Date:  11/14/2023 Time:  10:27 AM  Group Topic/Focus:  Goals Group:   The focus of this group is to help patients establish daily goals to achieve during treatment and discuss how the patient can incorporate goal setting into their daily lives to aide in recovery.    Participation Level:  Did Not Attend   Edward Shelton 11/14/2023, 10:27 AM

## 2023-11-14 NOTE — Progress Notes (Signed)
 72 Hour Document Signed Note  Patient Details Name: Edward Shelton MRN: [id number] DOB: 2000/04/30 Today's Date: 11/14/2023   72 Hour Signed Documentation:  Admission Status: Voluntary/72 hour document signed Date 72 hour document signed : 11/14/23 Time 72 hour document signed : [id number]      Edward Shelton 11/14/2023, 3:12 PM

## 2023-11-15 DIAGNOSIS — F332 Major depressive disorder, recurrent severe without psychotic features: Secondary | ICD-10-CM | POA: Diagnosis not present

## 2023-11-15 NOTE — [facility] Suicide Risk Assessment (Signed)
 [facility] Discharge Suicide Risk Assessment   Principal Problem: MDD (major depressive disorder), recurrent severe, without psychosis (HCC) Discharge Diagnoses: Principal Problem:   MDD (major depressive disorder), recurrent severe, without psychosis (HCC)   Total Time spent with patient: 1 hour  Musculoskeletal: Strength & Muscle Tone: within normal limits Gait & Station: normal Patient leans: N/A  Psychiatric Specialty Exam  Presentation  General Appearance:  Casual  Eye Contact: Fair  Speech: Clear and Coherent  Speech Volume: Normal  Handedness:No data recorded  Mood and Affect  Mood: Euthymic  Duration of Depression Symptoms: No data recorded Affect: Congruent   Thought Process  Thought Processes: Coherent  Descriptions of Associations:Intact  Orientation:Full (Time, Place and Person)  Thought Content:WDL  History of Schizophrenia/Schizoaffective disorder:No  Duration of Psychotic Symptoms:No data recorded Hallucinations:Hallucinations: None  Ideas of Reference:None  Suicidal Thoughts:Suicidal Thoughts: No  Homicidal Thoughts:Homicidal Thoughts: No   Sensorium  Memory: Immediate Fair; Recent Fair  Judgment: Intact  Insight: Good   Executive Functions  Concentration: Good  Attention Span: Good  Recall: Good  Fund of Knowledge: Good  Language: Good   Psychomotor Activity  Psychomotor Activity: Psychomotor Activity: Normal   Assets  Assets: Communication Skills; Desire for Improvement; Housing; Physical Health   Sleep  Sleep: Sleep: Good  Estimated Sleeping Duration (Last 24 Hours): 8.75-10.50 hours  Physical Exam: Physical Exam Vitals and nursing note reviewed.  HENT:     Head: Atraumatic.  Eyes:     Extraocular Movements: Extraocular movements intact.  Pulmonary:     Effort: Pulmonary effort is normal.  Neurological:     Mental Status: He is alert and oriented to person, place, and time.  Psychiatric:         Mood and Affect: Mood normal.        Behavior: Behavior normal.        Thought Content: Thought content normal.        Judgment: Judgment normal.    Review of Systems  Psychiatric/Behavioral:  Negative for hallucinations, substance abuse and suicidal ideas. The patient is not nervous/anxious and does not have insomnia.    Blood pressure 106/66, pulse (!) 54, temperature (!) 97.4 F (36.3 C), resp. rate 20, height 5' 3 (1.6 m), weight 58.5 kg, SpO2 100%. Body mass index is 22.85 kg/m.  Mental Status Per Nursing Assessment::   On Admission:  NA  Demographic Factors:  Male  Loss Factors: NA  Historical Factors: Impulsivity  Risk Reduction Factors:   Positive therapeutic relationship and Positive coping skills or problem solving skills  Continued Clinical Symptoms:  Previous Psychiatric Diagnoses and Treatments  Cognitive Features That Contribute To Risk:  None    Suicide Risk:  Minimal: No identifiable suicidal ideation.  Patients presenting with no risk factors but with morbid ruminations; may be classified as minimal risk based on the severity of the depressive symptoms   Follow-up Information     [facility] Follow up.   Contact information: [address] [place] [address] [phone]                 Plan Of Care/Follow-up recommendations:  # It is recommended to the patient to continue psychiatric medications as prescribed, after discharge from the hospital.   # It is recommended to the patient to follow up with your outpatient psychiatric provider and PCP. # It was discussed with the patient, the impact of alcohol, drugs, tobacco have been there overall psychiatric and medical wellbeing, and total abstinence from  substance use was recommended. # Prescriptions provided or sent directly to preferred pharmacy at discharge. Patient agreeable to plan. Given the opportunity to ask questions. Appears to feel comfortable with  discharge.  # In the event of worsening symptoms, the patient is instructed to call the crisis hotline (988), 911 and or go to the nearest ED for appropriate evaluation and treatment of symptoms. To follow-up with primary care provider for other medical issues, concerns and or health care needs # Patient was discharged home as requested with a plan to follow up as noted above.    Donnice FORBES Right, PA-C 11/15/2023, 11:46 AM

## 2023-11-15 NOTE — Plan of Care (Signed)
   Problem: Education: Goal: Knowledge of [facility] Education information/materials will improve Outcome: Progressing Goal: Emotional status will improve Outcome: Progressing Goal: Mental status will improve Outcome: Progressing Goal: Verbalization of understanding the information provided will improve Outcome: Progressing

## 2023-11-15 NOTE — Progress Notes (Signed)
 [facility] MD Progress Note  11/15/2023 8:39 AM Edward Shelton  MRN:  [id number]   Subjective:  Chart reviewed, case discussed in multidisciplinary meeting, patient seen during rounds.   8/12:  atient seen for follow up. They are alert and oriented. They are pleasant and cooperative on exam. They report med compliance and demonstrate good insight into the need for outpatient follow up. Is going to follow up at [facility]. They deny SI/hi/avh. They are linear, logical, and future oriented on exam. They report stable appetite, mood, and sleep. No changes in psychiatric symptoms since last f/u. No PRNs overnight. No self harm behavior. Has consistently denied SI. Interactig with staff and peers appropriately. Pharmacy is [facility] in [place]. They are voluntary and have requested discharge. They do not meet IVC criteria and will be discharged home today. No medication changes.   8/11 Patient seen for follow up. They are alert and oriented. They are pleasant and cooperative on exam. They report med compliance and demonstrate good insight into the need for outpatient follow up. Is going to follow up at [facility]. They deny SI/hi/avh. They are linear, logical, and future oriented on exam. They report stable appetite, mood, and sleep. No changes in psychiatric symptoms since last f/u. Report they live a lone and have steady employment. They also feel supported by family, and continue to be interested in substance use treatment. They voice no concerns or complaints at this time. Feels they have met their goal of getting started back on medications and are looking forward to discharge. No med adjustments. No behavioral PRNS over night.   8/10 Patient seen today for follow-up psychiatric evaluation. He reports depression is improved, denies anxiety, and states he is "fine." Reports eating and sleeping well. Denies suicidal ideation, homicidal ideation, auditory or visual hallucinations, paranoia, or other psychotic symptoms.  Reports no changes in psychiatric symptoms and continues to express interest in entering substance abuse rehabilitation following discharge. Denies medication side effects. States his goal is to get stabilized on medications and leave with a good outpatient plan.  11/12/23: Patient seen today for follow-up psychiatric evaluation. Upon approach, he is resting in his room and readily engages in brief interview. He denies suicidal ideation, homicidal ideation, auditory or visual hallucinations, paranoia, or other psychotic symptoms. Reports no changes in psychiatric symptoms and continues to express interest in entering substance abuse rehabilitation following discharge. Denies medication side effects. He shares goal of getting stabilized on medications and set out with good outpatient plan.   11/11/23: Patient seen today for follow-up psychiatric evaluation. He requests discharge from this setting, stating his intent was solely to have his psychiatric medications restarted. We reviewed the history of his presentation, which included suicidal ideation and mood decompensation in the context of frequent relapse on substances, leading to poor follow-up with mental health care and medication nonadherence, and overall instability. He reiterates his desire to seek inpatient long-term substance abuse treatment and reports he has located a bed at a facility in approximately two weeks. In the meantime, he plans to return to living alone at home until admission to that program. We discussed the limited support this plan provides, especially given his repeated relapses. Patient is considering the possibility of discharging to his parents' home for additional support. He reports tolerating medications well without side effects, and none are noted today. States mood is "okay."  Patient continues to show stabilization compared to admission, with improved grooming and engagement. While he denies acute psychiatric symptoms  today, his ongoing  substance use history, recent suicidal ideation, and limited interim supports prior to planned inpatient rehab remain significant risk factors. Continued hospitalization is indicated until a safe and structured discharge plan is secured.  Plan: No changes to current psychiatric medications today. Continue current regimen, safety monitoring, and coordination with social work regarding discharge planning. Encourage consideration of staying with parents until inpatient rehab bed is available.   Sleep: Good  Appetite:  Fair  Past Psychiatric History: see h&P Family History:  Family History  Problem Relation Age of Onset   Healthy Mother    Healthy Father    Social History:  Social History   Substance and Sexual Activity  Alcohol Use Not Currently     Social History   Substance and Sexual Activity  Drug Use Yes   Types: Marijuana, IV   Comment: shrooms, MDMA, ketamine    Social History   Socioeconomic History   Marital status: Single    Spouse name: Not on file   Number of children: Not on file   Years of education: Not on file   Highest education level: Not on file  Occupational History   Not on file  Tobacco Use   Smoking status: Every Day    Types: Cigarettes   Smokeless tobacco: Current  Vaping Use   Vaping status: Every Day  Substance and Sexual Activity   Alcohol use: Not Currently   Drug use: Yes    Types: Marijuana, IV    Comment: shrooms, MDMA, ketamine   Sexual activity: Yes  Other Topics Concern   Not on file  Social History Narrative   Not on file   Social Drivers of Health   Financial Resource Strain: Not on file  Food Insecurity: No Food Insecurity (11/09/2023)   Hunger Vital Sign    Worried About Running Out of Food in the Last Year: Never true    Ran Out of Food in the Last Year: Never true  Transportation Needs: No Transportation Needs (11/09/2023)   PRAPARE - Administrator, Civil Service (Medical): No    Lack  of Transportation (Non-Medical): No  Physical Activity: Not on file  Stress: Not on file  Social Connections: Not on file   Past Medical History:  Past Medical History:  Diagnosis Date   Depression    History reviewed. No pertinent surgical history.  Current Medications: Current Facility-Administered Medications  Medication Dose Route Frequency Provider Last Rate Last Admin   acetaminophen  (TYLENOL ) tablet 650 mg  650 mg Oral Q6H PRN Jadapalle, Sree, MD       alum & mag hydroxide-simeth (MAALOX/MYLANTA) 200-200-20 MG/5ML suspension 30 mL  30 mL Oral Q4H PRN Jadapalle, Sree, MD       ARIPiprazole  (ABILIFY ) tablet 20 mg  20 mg Oral Daily Jadapalle, Sree, MD   20 mg at 11/15/23 0809   haloperidol  (HALDOL ) tablet 5 mg  5 mg Oral TID PRN Jadapalle, Sree, MD       And   diphenhydrAMINE  (BENADRYL ) capsule 50 mg  50 mg Oral TID PRN Jadapalle, Sree, MD       haloperidol  lactate (HALDOL ) injection 5 mg  5 mg Intramuscular TID PRN Jadapalle, Sree, MD       And   diphenhydrAMINE  (BENADRYL ) injection 50 mg  50 mg Intramuscular TID PRN Jadapalle, Sree, MD       And   LORazepam  (ATIVAN ) injection 2 mg  2 mg Intramuscular TID PRN Donnelly Mellow, MD       haloperidol   lactate (HALDOL ) injection 10 mg  10 mg Intramuscular TID PRN Donnelly Mellow, MD       And   diphenhydrAMINE  (BENADRYL ) injection 50 mg  50 mg Intramuscular TID PRN Jadapalle, Sree, MD       And   LORazepam  (ATIVAN ) injection 2 mg  2 mg Intramuscular TID PRN Jadapalle, Sree, MD       FLUoxetine  (PROZAC ) capsule 40 mg  40 mg Oral Daily Jadapalle, Sree, MD   40 mg at 11/15/23 0809   hydrOXYzine  (ATARAX ) tablet 25 mg  25 mg Oral Q6H PRN Jadapalle, Sree, MD       magnesium  hydroxide (MILK OF MAGNESIA) suspension 30 mL  30 mL Oral Daily PRN Donnelly Mellow, MD       nicotine  polacrilex (NICORETTE ) gum 2 mg  2 mg Oral PRN Jadapalle, Sree, MD   2 mg at 11/13/23 9160   traZODone  (DESYREL ) tablet 50 mg  50 mg Oral QHS PRN Jadapalle, Sree, MD         Lab Results:  No results found for this or any previous visit (from the past 48 hours).   Blood Alcohol level:  Lab Results  Component Value Date   [facility] <15 11/09/2023   ETH <10 11/03/2022    Metabolic Disorder Labs: No results found for: HGBA1C, MPG No results found for: PROLACTIN No results found for: CHOL, TRIG, HDL, CHOLHDL, VLDL, LDLCALC     Psychiatric Specialty Exam: Appearance: Present in milieu more frequently, calm Eye contact: Appropriate Attention: Sustained Psychomotor activity: Normal Speech: Normal rate, rhythm, and volume Mood: Improved Affect: Appropriate and congruent Thought process: Linear and goal-directed Thought content: No delusions, paranoia, or obsessions Perception: No hallucinations endorsed or observed Orientation: Fully oriented to person, place, time, and situation Memory: Intact Concentration: Intact Recall: Intact Fund of knowledge: Adequate Language: Fluent and appropriate Insight: Fair Judgment: Fair Sleep: Reports good sleep Appetite: Reports good appetite Suicidal thoughts: Denied Homicidal thoughts: Denied  Musculoskeletal: Strength & Muscle Tone: within normal limits Gait & Station: normal    Physical Exam: Physical Exam Vitals and nursing note reviewed.  HENT:     Head: Atraumatic.  Eyes:     Extraocular Movements: Extraocular movements intact.  Pulmonary:     Effort: Pulmonary effort is normal.  Neurological:     Mental Status: He is alert and oriented to person, place, and time.  Psychiatric:        Mood and Affect: Mood normal.        Behavior: Behavior normal.        Thought Content: Thought content normal.        Judgment: Judgment normal.    Review of Systems  Psychiatric/Behavioral:  Positive for depression. Negative for hallucinations, substance abuse and suicidal ideas. The patient is not nervous/anxious and does not have insomnia.    Blood pressure 106/66, pulse (!) 54,  temperature (!) 97.4 F (36.3 C), resp. rate 20, height 5' 3 (1.6 m), weight 58.5 kg, SpO2 100%. Body mass index is 22.85 kg/m.  Diagnosis: Principal Problem:   MDD (major depressive disorder), recurrent severe, without psychosis (HCC)   PLAN: Safety and Monitoring:  -- Voluntary admission to inpatient psychiatric unit for safety, stabilization and treatment  -- Daily contact with patient to assess and evaluate symptoms and progress in treatment  -- Patient's case to be discussed in multi-disciplinary team meeting  -- Observation Level : q15 minute checks  -- Vital signs:  q12 hours  -- Precautions: suicide, elopement, and  assault -- Encouraged patient to participate in unit milieu and in scheduled group therapies   2. 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 The patient consents to medication trial.                -- Metabolic profile and EKG monitoring obtained while on an atypical antipsychotic (BMI: Lipid Panel: HbgA1c: QTc:)              -- Encouraged patient to participate in unit milieu and in scheduled group therapies                            3. Medical Issues Being Addressed: no needs identified    4. Discharge Planning:   -- Social work and case management to assist with discharge planning and identification of hospital follow-up needs prior to discharge  -- Discharge today.   Donnice FORBES Right, PA-C 11/15/2023, 8:39 AM

## 2023-11-15 NOTE — Group Note (Signed)
 Recreation Therapy Group Note   Group Topic:General Recreation  Group Date: 11/15/2023 Start Time: 1000 End Time: 1110 Facilitators: Celestia Jeoffrey BRAVO, LRT, CTRS Location: Courtyard  Group Description: [employer]. LRT and patients played games of basketball, drew with chalk, and played corn hole while outside in the courtyard while getting fresh air and sunlight. Music was being played in the background. LRT and peers conversed about different games they have played before, what they do in their free time and anything else that is on their minds. LRT encouraged pts to drink water after being outside, sweating and getting their heart rate up.  Goal Area(s) Addressed: Patient will build on frustration tolerance skills. Patients will partake in a competitive play game with peers. Patients will gain knowledge of new leisure interest/hobby.    Affect/Mood: Appropriate   Participation Level: Active   Participation Quality: Independent   Behavior: Appropriate   Speech/Thought Process: Coherent   Insight: Good   Judgement: Good   Modes of Intervention: Activity   Patient Response to Interventions:  Receptive   Education Outcome:  Acknowledges education   Clinical Observations/Individualized Feedback: Edward Shelton was active in their participation of session activities and group discussion. Pt interacted well with LRT and peers duration of session.    Plan: Continue to engage patient in RT group sessions 2-3x/week.   [address] LRT, CTRS 11/15/2023 1:26 PM

## 2023-11-15 NOTE — Plan of Care (Signed)
 Problem: Education: Goal: Knowledge of [facility] Education information/materials will improve Outcome: Adequate for Discharge Goal: Emotional status will improve Outcome: Adequate for Discharge Goal: Mental status will improve Outcome: Adequate for Discharge Goal: Verbalization of understanding the information provided will improve Outcome: Adequate for Discharge   Problem: Activity: Goal: Interest or engagement in activities will improve Outcome: Adequate for Discharge Goal: Sleeping patterns will improve Outcome: Adequate for Discharge   Problem: Coping: Goal: Ability to verbalize frustrations and anger appropriately will improve Outcome: Adequate for Discharge Goal: Ability to demonstrate self-control will improve Outcome: Adequate for Discharge   Problem: Health Behavior/Discharge Planning: Goal: Identification of resources available to assist in meeting health care needs will improve Outcome: Adequate for Discharge Goal: Compliance with treatment plan for underlying cause of condition will improve Outcome: Adequate for Discharge   Problem: Physical Regulation: Goal: Ability to maintain clinical measurements within normal limits will improve Outcome: Adequate for Discharge   Problem: Safety: Goal: Periods of time without injury will increase Outcome: Adequate for Discharge   Problem: Education: Goal: Knowledge of [facility] Education information/materials will improve Outcome: Adequate for Discharge Goal: Emotional status will improve Outcome: Adequate for Discharge Goal: Mental status will improve Outcome: Adequate for Discharge Goal: Verbalization of understanding the information provided will improve Outcome: Adequate for Discharge   Problem: Activity: Goal: Interest or engagement in activities will improve Outcome: Adequate for Discharge Goal: Sleeping patterns will improve Outcome: Adequate for Discharge   Problem: Coping: Goal:  Ability to verbalize frustrations and anger appropriately will improve Outcome: Adequate for Discharge   Problem: Coping: Goal: Ability to verbalize frustrations and anger appropriately will improve Outcome: Adequate for Discharge Goal: Ability to demonstrate self-control will improve Outcome: Adequate for Discharge   Problem: Health Behavior/Discharge Planning: Goal: Identification of resources available to assist in meeting health care needs will improve Outcome: Adequate for Discharge Goal: Compliance with treatment plan for underlying cause of condition will improve Outcome: Adequate for Discharge   Problem: Physical Regulation: Goal: Ability to maintain clinical measurements within normal limits will improve Outcome: Adequate for Discharge   Problem: Safety: Goal: Periods of time without injury will increase Outcome: Adequate for Discharge   Problem: Education: Goal: Utilization of techniques to improve thought processes will improve Outcome: Adequate for Discharge Goal: Knowledge of the prescribed therapeutic regimen will improve Outcome: Adequate for Discharge   Problem: Activity: Goal: Interest or engagement in leisure activities will improve Outcome: Adequate for Discharge Goal: Imbalance in normal sleep/wake cycle will improve Outcome: Adequate for Discharge   Problem: Coping: Goal: Coping ability will improve Outcome: Adequate for Discharge Goal: Will verbalize feelings Outcome: Adequate for Discharge   Problem: Health Behavior/Discharge Planning: Goal: Ability to make decisions will improve Outcome: Adequate for Discharge Goal: Compliance with therapeutic regimen will improve Outcome: Adequate for Discharge   Problem: Role Relationship: Goal: Will demonstrate positive changes in social behaviors and relationships Outcome: Adequate for Discharge   Problem: Safety: Goal: Ability to disclose and discuss suicidal ideas will improve Outcome: Adequate for  Discharge Goal: Ability to identify and utilize support systems that promote safety will improve Outcome: Adequate for Discharge   Problem: Self-Concept: Goal: Will verbalize positive feelings about self Outcome: Adequate for Discharge Goal: Level of anxiety will decrease Outcome: Adequate for Discharge   Problem: Education: Goal: Ability to make informed decisions regarding treatment will improve Outcome: Adequate for Discharge   Problem: Coping: Goal: Coping ability will improve Outcome: Adequate for Discharge   Problem: Health Behavior/Discharge  Planning: Goal: Identification of resources available to assist in meeting health care needs will improve Outcome: Adequate for Discharge   Problem: Medication: Goal: Compliance with prescribed medication regimen will improve Outcome: Adequate for Discharge   Problem: Self-Concept: Goal: Ability to disclose and discuss suicidal ideas will improve Outcome: Adequate for Discharge Goal: Will verbalize positive feelings about self Outcome: Adequate for Discharge   Problem: Education: Goal: Knowledge of disease or condition will improve Outcome: Adequate for Discharge Goal: Understanding of discharge needs will improve Outcome: Adequate for Discharge   Problem: Health Behavior/Discharge Planning: Goal: Ability to identify changes in lifestyle to reduce recurrence of condition will improve Outcome: Adequate for Discharge Goal: Identification of resources available to assist in meeting health care needs will improve Outcome: Adequate for Discharge   Problem: Physical Regulation: Goal: Complications related to the disease process, condition or treatment will be avoided or minimized Outcome: Adequate for Discharge

## 2023-11-15 NOTE — Group Note (Signed)
 Date:  11/15/2023 Time:  10:07 AM  Group Topic/Focus:  Goals Group:   The focus of this group is to help patients establish daily goals to achieve during treatment and discuss how the patient can incorporate goal setting into their daily lives to aide in recovery.    Participation Level:  Did Not Attend   Deitra Clap [facility] 11/15/2023, 10:07 AM

## 2023-11-15 NOTE — Discharge Summary (Signed)
 Physician Discharge Summary Note  Patient:  Edward Shelton is an 23 y.o., male MRN:  [id number] DOB:  [birth year] Patient phone:  [phone] (home)  Patient address:   [address] [place] [address],   Total time spent: 40 min Date of Admission:  11/09/2023 Date of Discharge: 11/15/23  Reason for Admission:    Patient is a 23 year old male with a psychiatric history of major depressive disorder and polysubstance abuse who presented voluntarily to the emergency department with worsening depression and active suicidal ideation, including a plan to cut his throat, in the context of medication noncompliance, cocaine use, and recent psychosocial stressors including leaving his parents' home. He reported depressive symptoms of anhedonia, sadness, low motivation, poor energy, decreased sleep, and social withdrawal, along with acknowledgment that psychiatric medications had been helpful when compliant. He denied trauma history and reported auditory/visual hallucinations only in the context of drug use. Given his suicidal ideation, substance relapse, and functional decline, he was admitted for psychiatric stabilization, medication re-initiation, and safety monitoring.  Principal Problem: MDD (major depressive disorder), recurrent severe, without psychosis (HCC) Discharge Diagnoses: Principal Problem:   MDD (major depressive disorder), recurrent severe, without psychosis (HCC)   Past Psychiatric History: see h and p  Family Psychiatric  History: see h and p Social History:  Social History   Substance and Sexual Activity  Alcohol Use Not Currently     Social History   Substance and Sexual Activity  Drug Use Yes   Types: Marijuana, IV   Comment: shrooms, MDMA, ketamine    Social History   Socioeconomic History   Marital status: Single    Spouse name: Not on file   Number of children: Not on file   Years of education: Not on file   Highest education level: Not on file   Occupational History   Not on file  Tobacco Use   Smoking status: Every Day    Types: Cigarettes   Smokeless tobacco: Current  Vaping Use   Vaping status: Every Day  Substance and Sexual Activity   Alcohol use: Not Currently   Drug use: Yes    Types: Marijuana, IV    Comment: shrooms, MDMA, ketamine   Sexual activity: Yes  Other Topics Concern   Not on file  Social History Narrative   Not on file   Social Drivers of Health   Financial Resource Strain: Not on file  Food Insecurity: No Food Insecurity (11/09/2023)   Hunger Vital Sign    Worried About Running Out of Food in the Last Year: Never true    Ran Out of Food in the Last Year: Never true  Transportation Needs: No Transportation Needs (11/09/2023)   PRAPARE - Administrator, Civil Service (Medical): No    Lack of Transportation (Non-Medical): No  Physical Activity: Not on file  Stress: Not on file  Social Connections: Not on file   Past Medical History:  Past Medical History:  Diagnosis Date   Depression    History reviewed. No pertinent surgical history. Family History:  Family History  Problem Relation Age of Onset   Healthy Mother    Healthy Father     Hospital Course:    On admission, the patient was noted to be somewhat disheveled and withdrawn but cooperative with evaluation. Urine drug screen was positive for cocaine, blood alcohol level was negative, and EKG was within normal limits. Home medications of aripiprazole  20 mg PO daily and fluoxetine  40 mg PO  daily were restarted, which the patient tolerated well without reported or observed adverse effects. He was encouraged to participate in unit programming and engage in discharge planning.  Over the course of hospitalization, the patient demonstrated gradual mood improvement, increased engagement, and consistent medication adherence. He denied suicidal ideation, homicidal ideation, hallucinations, or paranoia after the first hospital day and  exhibited linear, logical, and future-oriented thought processes. He maintained adequate appetite and sleep, interacted appropriately with peers and staff, and voiced clear goals of continuing outpatient psychiatric care and pursuing substance abuse treatment, including a planned inpatient rehabilitation program. Social work assisted in coordinating follow-up at [facility] for medication management and therapy, and the patient explored discharge options, including staying with his parents until rehab admission.  By the final days of hospitalization, the patient reported stable mood, no psychiatric symptom recurrence, and good insight into the importance of medication adherence and substance abstinence. He remained voluntary and requested discharge, noting he felt ready to continue recovery efforts in the community.  Detailed risk assessment is complete based on clinical exam and individual risk factors, and acute suicide risk is low and acute violence risk is low. Currently, all modifiable risk of harm to self or others have been addressed, and the patient is no longer appropriate for the acute inpatient setting. He is able to continue treatment for mental health needs in the community with the supports as indicated below. The patient was educated and verbalized understanding of the discharge plan of care, including medications, follow-up appointments, mental health resources, and further crisis services in the community. He was instructed to call 911 or present to the nearest emergency room should he experience any decompensation in mood or return of suicidal or homicidal ideations. The patient verbalized understanding of this education and agreed to the plan of care.  The patient was discharged home in stable condition on aripiprazole  20 mg PO daily and fluoxetine  40 mg PO daily, with [facility]-up at [facility] and referral to inpatient substance abuse treatment.    Physical Findings: AIMS:  , ,  ,  ,    CIWA:     COWS:        Psychiatric Specialty Exam:  Presentation  General Appearance:  Casual  Eye Contact: Fair  Speech: Clear and Coherent  Speech Volume: Normal    Mood and Affect  Mood: Euthymic  Affect: Congruent   Thought Process  Thought Processes: Coherent  Descriptions of Associations:Intact  Orientation:Full (Time, Place and Person)  Thought Content:WDL  Hallucinations:No data recorded Ideas of Reference:None  Suicidal Thoughts:No data recorded Homicidal Thoughts:No data recorded  Sensorium  Memory: Immediate Fair; Recent Fair  Judgment: Intact  Insight: Good   Executive Functions  Concentration: Good  Attention Span: Good  Recall: Good  Fund of Knowledge: Good  Language: Good   Psychomotor Activity  Psychomotor Activity:No data recorded Musculoskeletal: Strength & Muscle Tone: within normal limits Gait & Station: normal Assets  Assets: Manufacturing systems engineer; Desire for Improvement; Housing; Physical Health   Sleep  Sleep:No data recorded   Physical Exam: Physical Exam Vitals and nursing note reviewed.  HENT:     Head: Atraumatic.  Eyes:     Extraocular Movements: Extraocular movements intact.  Pulmonary:     Effort: Pulmonary effort is normal.  Neurological:     Mental Status: He is alert and oriented to person, place, and time.  Psychiatric:        Mood and Affect: Mood normal.        Behavior: Behavior  normal.        Thought Content: Thought content normal.        Judgment: Judgment normal.    Review of Systems  Psychiatric/Behavioral:  Negative for hallucinations, memory loss, substance abuse and suicidal ideas. The patient is not nervous/anxious and does not have insomnia.    Blood pressure 106/66, pulse (!) 54, temperature (!) 97.4 F (36.3 C), resp. rate 20, height 5' 3 (1.6 m), weight 58.5 kg, SpO2 100%. Body mass index is 22.85 kg/m.   Social History   Tobacco Use  Smoking Status Every Day    Types: Cigarettes  Smokeless Tobacco Current   Tobacco Cessation:  A prescription for an FDA-approved tobacco cessation medication was offered at discharge and the patient refused   Blood Alcohol level:  Lab Results  Component Value Date   [facility] <15 11/09/2023   ETH <10 11/03/2022    Metabolic Disorder Labs:  No results found for: HGBA1C, MPG No results found for: PROLACTIN No results found for: CHOL, TRIG, HDL, CHOLHDL, VLDL, LDLCALC  See Psychiatric Specialty Exam and Suicide Risk Assessment completed by Attending Physician prior to discharge.  Discharge destination:  Home  Is patient on multiple antipsychotic therapies at discharge:  No   Has Patient had three or more failed trials of antipsychotic monotherapy by history:  No  Recommended Plan for Multiple Antipsychotic Therapies: NA   Allergies as of 11/15/2023   No Known Allergies      Medication List     STOP taking these medications    DULoxetine  30 MG capsule Commonly known as: CYMBALTA    guanFACINE  1 MG Tb24 ER tablet Commonly known as: INTUNIV    hydrOXYzine  25 MG capsule Commonly known as: VISTARIL    hydrOXYzine  25 MG tablet Commonly known as: ATARAX    mirtazapine  15 MG tablet Commonly known as: REMERON    OLANZapine  2.5 MG tablet Commonly known as: ZYPREXA    traZODone  50 MG tablet Commonly known as: DESYREL    venlafaxine  37.5 MG tablet Commonly known as: EFFEXOR        TAKE these medications      Indication  ARIPiprazole  20 MG tablet Commonly known as: ABILIFY  Take one tablet (20 mg dose) by mouth daily for 14 days.  Indication: Mood disorder   FLUoxetine  40 MG capsule Commonly known as: PROZAC  Take one capsule (40 mg dose) by mouth daily for 14 days.  Indication: Depression        Follow-up Information     [facility] Follow up.   Why: Your appointment is scheduled for Wednesday, 11/16/23 at 10AM. Contact information: [address] [place] [address] [phone]                 Follow-up recommendations:    # It is recommended to the patient to continue psychiatric medications as prescribed, after discharge from the hospital.   # It is recommended to the patient to follow up with your outpatient psychiatric provider and PCP. # It was discussed with the patient, the impact of alcohol, drugs, tobacco have been there overall psychiatric and medical wellbeing, and total abstinence from substance use was recommended. # Prescriptions provided or sent directly to preferred pharmacy at discharge. Patient agreeable to plan. Given the opportunity to ask questions. Appears to feel comfortable with discharge.  # In the event of worsening symptoms, the patient is instructed to call the crisis hotline (988), 911 and or go to the nearest ED for appropriate evaluation and treatment of symptoms. To  follow-up with primary care provider for other medical issues, concerns and or health care needs # Patient was discharged home as requested with a plan to follow up as noted above.      Signed: Donnice FORBES Right, PA-C 11/18/2023, 3:31 PM

## 2023-11-15 NOTE — Progress Notes (Signed)
 Discharge note: Suicide safety plan and survey complete. RN met with pt and reviewed pt's discharge instructions. Pt verbalized understanding of discharge instructions and pt did not have any questions. RN reviewed and provided pt with a copy of SRA, AVS and Transition Record. RN returned pt's belongings to pt. Patient reported he had medication waiting on him at pharmacy. Pt denied SI/HI/AVH and voiced no concerns. Pt was appreciative of the care pt received at [facility]. Patient discharged to the lobby- provided with cab voucher by social work team.

## 2023-11-15 NOTE — Progress Notes (Signed)
  [facility] Adult Case Management Discharge Plan :  Will you be returning to the same living situation after discharge:  Yes,  pt plans to return home upon discharge.  At discharge, do you have transportation home?: Yes,  pt received taxi voucher. Do you have the ability to pay for your medications: Yes,  [place] MEDICAID PREPAID HEALTH PLAN / [place] MEDICAID [facility] COMMUNITY  Release of information consent forms completed and in the chart;  Patient's signature needed at discharge.  Patient to Follow up at:  Follow-up Information     [facility] Follow up.   Why: Your appointment is scheduled for Wednesday, 11/16/23 at 10AM. Contact information: [address] [place] [address] [phone]                 Next level of care provider has access to [facility] Link:no  Safety Planning and Suicide Prevention discussed: Yes,  SPE completed with pt.      Has patient been referred to the Quitline?: Patient refused referral for treatment  Patient has been referred for addiction treatment: Yes, the patient will follow up with an outpatient provider for substance use disorder. Psychiatrist/APP: appointment made and Therapist: appointment made  Nadara JONELLE Fam, LCSW 11/15/2023, 12:48 PM

## 2023-11-27 ENCOUNTER — Emergency Department
Admission: EM | Admit: 2023-11-27 | Discharge: 2023-11-28 | Disposition: A | Discharge status: Home / Self Care | Attending: Emergency Medicine | Admitting: Emergency Medicine

## 2023-11-27 ENCOUNTER — Other Ambulatory Visit: Payer: Self-pay

## 2023-11-27 ENCOUNTER — Encounter: Payer: Self-pay | Admitting: Emergency Medicine

## 2023-11-27 DIAGNOSIS — F330 Major depressive disorder, recurrent, mild: Secondary | ICD-10-CM | POA: Diagnosis not present

## 2023-11-27 DIAGNOSIS — F331 Major depressive disorder, recurrent, moderate: Secondary | ICD-10-CM | POA: Diagnosis present

## 2023-11-27 DIAGNOSIS — F19259 Other psychoactive substance dependence with psychoactive substance-induced psychotic disorder, unspecified: Secondary | ICD-10-CM | POA: Diagnosis not present

## 2023-11-27 DIAGNOSIS — F1910 Other psychoactive substance abuse, uncomplicated: Secondary | ICD-10-CM

## 2023-11-27 DIAGNOSIS — F19951 Other psychoactive substance use, unspecified with psychoactive substance-induced psychotic disorder with hallucinations: Secondary | ICD-10-CM | POA: Insufficient documentation

## 2023-11-27 DIAGNOSIS — F19959 Other psychoactive substance use, unspecified with psychoactive substance-induced psychotic disorder, unspecified: Secondary | ICD-10-CM | POA: Diagnosis present

## 2023-11-27 DIAGNOSIS — F1920 Other psychoactive substance dependence, uncomplicated: Secondary | ICD-10-CM | POA: Diagnosis present

## 2023-11-27 LAB — CBC
HCT: 39.2 % (ref 39.0–52.0)
Hemoglobin: 14.1 g/dL (ref 13.0–17.0)
MCH: 31.1 pg (ref 26.0–34.0)
MCHC: 36 g/dL (ref 30.0–36.0)
MCV: 86.5 fL (ref 80.0–100.0)
Platelets: 188 K/uL (ref 150–400)
RBC: 4.53 MIL/uL (ref 4.22–5.81)
RDW: 13.5 % (ref 11.5–15.5)
WBC: 8.3 K/uL (ref 4.0–10.5)
nRBC: 0 % (ref 0.0–0.2)

## 2023-11-27 LAB — COMPREHENSIVE METABOLIC PANEL WITH GFR
ALT: 18 U/L (ref 0–44)
AST: 20 U/L (ref 15–41)
Albumin: 3.8 g/dL (ref 3.5–5.0)
Alkaline Phosphatase: 51 U/L (ref 38–126)
Anion gap: 9 (ref 5–15)
BUN: 13 mg/dL (ref 6–20)
CO2: 25 mmol/L (ref 22–32)
Calcium: 8.8 mg/dL — ABNORMAL LOW (ref 8.9–10.3)
Chloride: 106 mmol/L (ref 98–111)
Creatinine, Ser: 0.65 mg/dL (ref 0.61–1.24)
GFR, Estimated: >60 mL/min (ref >60)
Glucose, Bld: 106 mg/dL — ABNORMAL HIGH (ref 70–99)
Potassium: 3.4 mmol/L — ABNORMAL LOW (ref 3.5–5.1)
Sodium: 140 mmol/L (ref 135–145)
Total Bilirubin: 0.8 mg/dL (ref 0.0–1.2)
Total Protein: 6.8 g/dL (ref 6.5–8.1)

## 2023-11-27 LAB — ETHANOL: Alcohol, Ethyl (B): <15 mg/dL (ref <15)

## 2023-11-27 MED ORDER — ALUM & MAG HYDROXIDE-SIMETH 200-200-20 MG/5ML PO SUSP: 30.0000 mL | Freq: Four times a day (QID) | ORAL | Status: DC | PRN

## 2023-11-27 MED ORDER — IBUPROFEN 600 MG PO TABS: 600.0000 mg | ORAL_TABLET | Freq: Three times a day (TID) | ORAL | Status: DC | PRN

## 2023-11-27 MED ORDER — ONDANSETRON HCL 4 MG PO TABS
4.0000 mg | ORAL_TABLET | Freq: Three times a day (TID) | ORAL | Status: DC | PRN
Start: 2023-11-27 — End: 2023-11-28

## 2023-11-27 NOTE — ED Notes (Addendum)
 Patient dressed in hospitals scrubs with RN and [place], [place].   Belongings include:  Elnor shirt, brown shoes, black socks, tan pants, black underwear.

## 2023-11-27 NOTE — ED Provider Notes (Signed)
 [facility] Provider Note    Event Date/Time   First MD Initiated Contact with Patient 11/27/23 2033     (approximate)   History   Chief Complaint: Psychiatric Evaluation   HPI  Edward Shelton is a 23 y.o. male with a history of depression and polysubstance abuse who comes ED complaining of visual hallucinations for the last 2 days in the context of drug use.  Denies any medical complaints.  No current SI or HI.  No auditory hallucinations     Physical Exam   Triage Vital Signs: ED Triage Vitals  Encounter Vitals Group     BP 11/27/23 2002 117/83     Girls Systolic BP Percentile --      Girls Diastolic BP Percentile --      Boys Systolic BP Percentile --      Boys Diastolic BP Percentile --      Pulse Rate 11/27/23 2002 78     Resp 11/27/23 2002 16     Temp 11/27/23 2002 97.7 F (36.5 C)     Temp Source 11/27/23 2002 Oral     SpO2 11/27/23 2002 99 %     Weight 11/27/23 2000 130 lb (59 kg)     Height 11/27/23 2000 5' 3 (1.6 m)     Head Circumference --      Peak Flow --      Pain Score 11/27/23 2000 0     Pain Loc --      Pain Education --      Exclude from Growth Chart --     Most recent vital signs: Vitals:   11/27/23 2002  BP: 117/83  Pulse: 78  Resp: 16  Temp: 97.7 F (36.5 C)  SpO2: 99%    General: Awake, no distress. CV:  Good peripheral perfusion.  Resp:  Normal effort.  Abd:  No distention.  Other:  No wounds   ED Results / Procedures / Treatments   Labs (all labs ordered are listed, but only abnormal results are displayed) Labs Reviewed  CBC  COMPREHENSIVE METABOLIC PANEL WITH GFR  ETHANOL  URINE DRUG SCREEN, QUALITATIVE ([facility] ONLY)     EKG    RADIOLOGY    PROCEDURES:  Procedures   MEDICATIONS ORDERED IN ED: Medications  ibuprofen  (ADVIL ) tablet 600 mg (has no administration in time range)  ondansetron  (ZOFRAN ) tablet 4 mg (has no administration in time range)  alum & mag  hydroxide-simeth (MAALOX/MYLANTA) 200-200-20 MG/5ML suspension 30 mL (has no administration in time range)     IMPRESSION / MDM / ASSESSMENT AND PLAN / ED COURSE  I reviewed the triage vital signs and the nursing notes.  Patient's presentation is most consistent with acute presentation with potential threat to life or bodily function.  Patient presents with visual hallucinations, most likely substance induced psychosis.  Will request psychiatry evaluation.  The patient has been placed in psychiatric observation due to the need to provide a safe environment for the patient while obtaining psychiatric consultation and evaluation, as well as ongoing medical and medication management to treat the patient's condition.  The patient has not been placed under full IVC at this time.      FINAL CLINICAL IMPRESSION(S) / ED DIAGNOSES   Final diagnoses:  Polysubstance abuse (HCC)     Rx / DC Orders   ED Discharge Orders     None        Note:  This document was prepared using Dragon voice  recognition software and may include unintentional dictation errors.   Viviann Pastor, MD 11/27/23 2035

## 2023-11-27 NOTE — ED Notes (Signed)
TTS speaking with patient. 

## 2023-11-27 NOTE — ED Notes (Signed)
 Patient to ED for visual hallucinations.  Patient reports use of acid and molly (last 4 days ago) and cocaine (last 12 hrs ago).  States hallucinations usually go away but not this time.  States he sees people that are not there.  Patient denies si/hi at this time.  States he would consider detox, 'it's hard.

## 2023-11-27 NOTE — ED Notes (Signed)
 Pt refusing snack at this time. Pt calm and cooperative.

## 2023-11-27 NOTE — ED Triage Notes (Signed)
 Patient reports visual hallucinations for 2 days. Patient reports taking acid and molly 4 days ago - denies etoh use. Endorses history of SI but denies current SI or HI. Calm and cooperative in triage.

## 2023-11-27 NOTE — BH Assessment (Signed)
 Comprehensive Clinical Assessment (CCA) Screening, Triage and Referral Note  11/27/2023 Edward Shelton [id number] Edward Shelton, [place] speaking, Hispanic male. Pt presented to [facility] ED voluntary. Per triage note: Patient reports visual hallucinations for 2 days. Patient reports taking acid and molly 4 days ago - denies etoh use. Endorses history of SI but denies current SI or HI. Calm and cooperative in triage.   Subjective: Patient presented to the ED reporting an escalation in substance use, stating, "I kept seeing ghosts and I've been up for the last 3 days." Patient disclosed recent use of acid and molly, and admitted to using cocaine earlier in the day. He identified ketamine as his drug of choice and reported using cocaine or molly when ketamine is unavailable. Patient described his substance use as problematic and acknowledged its negative impact on his mental health. He expressed feelings of sadness, dissatisfaction with life, and boredom as contributing factors to his use. Patient expressed a desire for substance abuse treatment and longer-term care, noting difficulty with independently following up after discharge. Objective: Speech: Clear and coherent Orientation: Alert and oriented x4 Mood: Depressed Affect: Incongruent Thought process: Relevant and coherent Insight: Good Judgment: Impaired Behavior: Cooperative Safety: Denied current suicidal ideation (SI), homicidal ideation (HI), and auditory/visual hallucinations (A/H); admitted to intermittent thoughts of SI Toxicology: Positive for cocaine Assessment: Patient presents with substance-induced mood disorder and polysubstance use, including recent use of LSD, MDMA, and cocaine. Insight into the problematic nature of his use is present, though judgment remains impaired. Mood symptoms appear to be exacerbated by substance use and psychosocial stressors. Safety risk is moderate due to intermittent SI and impaired judgment.     Chief Complaint:  Chief Complaint  Patient presents with   Psychiatric Evaluation   Visit Diagnosis: 1.  Substance Induced Psychosis 2.  Polysub Dependence  3.  MDD, recurrent, mild   Patient Reported Information How did you hear about us ? No data recorded What Is the Reason for Your Visit/Call Today? Patient came to ED because he has been off meds for 2 weeks with SI and plan to slice his throat.  How Long Has This Been Causing You Problems? > than 6 months  What Do You Feel Would Help You the Most Today? Alcohol or Drug Use Treatment; Medication(s); Treatment for Depression or other mood problem   Have You Recently Had Any Thoughts About Hurting Yourself? Yes  Are You Planning to Commit Suicide/Harm Yourself At This time? No   Have you Recently Had Thoughts About Hurting Someone [place]? No  Are You Planning to Harm Someone at This Time? No  Explanation: No data recorded  Have You Used Any Alcohol or Drugs in the Past 24 Hours? Yes  How Long Ago Did You Use Drugs or Alcohol? 11/08/23- cocaine  What Did You Use and How Much? Ketamine, cocaine, amphetamines, LSD   Do You Currently Have a Therapist/Psychiatrist? No  Name of Therapist/Psychiatrist: No data recorded  Have You Been Recently Discharged From Any Office Practice or Programs? No  Explanation of Discharge From Practice/Program: No data recorded   CCA Screening Triage Referral Assessment Type of Contact: Face-to-Face  Telemedicine Service Delivery:   Is this Initial or Reassessment?   Date Telepsych consult ordered in [facility]:    Time Telepsych consult ordered in [facility]:    [facility] of Assessment: [facility] ED  Provider Location: [facility] ED    Collateral Involvement: None provided   Does Patient Have a Court Appointed Legal Guardian? No data recorded Name  and Contact of Legal Guardian: No data recorded If Minor and Not Living with Parent(s), Who has Custody? No data recorded Is CPS involved or ever been involved?  No data recorded Is APS involved or ever been involved? No data recorded  Patient Determined To Be At Risk for Harm To Self or Others Based on Review of Patient Reported Information or Presenting Complaint? Yes, for Self-Harm  Method: Plan without intent  Availability of Means: No access or NA  Intent: Vague intent or NA  Notification Required: No need or identified person  Additional Information for Danger to Others Potential: No data recorded Additional Comments for Danger to Others Potential: No data recorded Are There Guns or Other Weapons in Your Home? No  Types of Guns/Weapons: No data recorded Are These Weapons Safely Secured?                            No data recorded Who Could Verify You Are Able To Have These Secured: No data recorded Do You Have any Outstanding Charges, Pending Court Dates, Parole/Probation? No data recorded Contacted To Inform of Risk of Harm To Self or Others: No data recorded  Does Patient Present under Involuntary Commitment? No    [place] of Residence: [place]   Patient Currently Receiving the Following Services: Not Receiving Services   Determination of Need: Emergent (2 hours)   Options For Referral: ED Visit; Medication Management; Inpatient Hospitalization; Chemical Dependency Intensive Outpatient Therapy (CDIOP)   Disposition Recommendation per psychiatric provider: Detox/Substance abuse treatment  Donatella Walski R Maryetta Shafer, LCAS

## 2023-11-28 ENCOUNTER — Encounter: Payer: Self-pay | Admitting: Psychiatry

## 2023-11-28 DIAGNOSIS — F330 Major depressive disorder, recurrent, mild: Secondary | ICD-10-CM | POA: Diagnosis not present

## 2023-11-28 DIAGNOSIS — F19259 Other psychoactive substance dependence with psychoactive substance-induced psychotic disorder, unspecified: Secondary | ICD-10-CM

## 2023-11-28 DIAGNOSIS — F332 Major depressive disorder, recurrent severe without psychotic features: Secondary | ICD-10-CM | POA: Diagnosis not present

## 2023-11-28 DIAGNOSIS — F1920 Other psychoactive substance dependence, uncomplicated: Secondary | ICD-10-CM | POA: Diagnosis present

## 2023-11-28 DIAGNOSIS — F19959 Other psychoactive substance use, unspecified with psychoactive substance-induced psychotic disorder, unspecified: Secondary | ICD-10-CM | POA: Diagnosis present

## 2023-11-28 LAB — URINE DRUG SCREEN, QUALITATIVE (ARMC ONLY)
Amphetamines, Ur Screen: NOT DETECTED
Barbiturates, Ur Screen: NOT DETECTED
Benzodiazepine, Ur Scrn: NOT DETECTED
Cannabinoid 50 Ng, Ur ~~LOC~~: POSITIVE — AB
Cocaine Metabolite,Ur ~~LOC~~: POSITIVE — AB
MDMA (Ecstasy)Ur Screen: NOT DETECTED
Methadone Scn, Ur: NOT DETECTED
Opiate, Ur Screen: NOT DETECTED
Phencyclidine (PCP) Ur S: NOT DETECTED
Tricyclic, Ur Screen: NOT DETECTED

## 2023-11-28 MED ORDER — DIPHENHYDRAMINE HCL 25 MG PO CAPS
25.0000 mg | ORAL_CAPSULE | Freq: Four times a day (QID) | ORAL | Status: DC | PRN
Start: 2023-11-28 — End: 2023-11-28

## 2023-11-28 MED ORDER — OLANZAPINE 5 MG PO TBDP: 5.0000 mg | ORAL_TABLET | Freq: Two times a day (BID) | ORAL | Status: DC | PRN

## 2023-11-28 MED ORDER — FLUOXETINE HCL 20 MG PO CAPS
40.0000 mg | ORAL_CAPSULE | Freq: Every day | ORAL | Status: DC
  Administered 2023-11-28: 40 mg via ORAL
  Filled 2023-11-28: qty 2

## 2023-11-28 MED ORDER — TRAZODONE HCL 50 MG PO TABS: 50.0000 mg | ORAL_TABLET | Freq: Every evening | ORAL | Status: DC | PRN

## 2023-11-28 MED ORDER — ARIPIPRAZOLE 10 MG PO TABS
20.0000 mg | ORAL_TABLET | Freq: Every day | ORAL | Status: DC
Start: 2023-11-28 — End: 2023-11-28
  Filled 2023-11-28: qty 2

## 2023-11-28 MED ORDER — ARIPIPRAZOLE 10 MG PO TABS
20.0000 mg | ORAL_TABLET | Freq: Once | ORAL | Status: AC
  Administered 2023-11-28: 20 mg via ORAL
  Filled 2023-11-28: qty 2
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
## 2023-11-28 NOTE — ED Notes (Signed)
 Pt asleep and unable to provide UDS at this time.

## 2023-11-28 NOTE — ED Notes (Signed)
 Patient speaking to telepsych provider.

## 2023-11-28 NOTE — ED Notes (Signed)
 Vol/psych pending consult

## 2023-11-28 NOTE — ED Notes (Signed)
Pt refused shower.  

## 2023-11-28 NOTE — ED Notes (Signed)
 Pt provided with breakfast tray.

## 2023-11-28 NOTE — ED Notes (Signed)
 Pt given phone to call for a ride and given his belongings to get dressed. ED provider explained discharge instructions to the pt and he verbalized understanding.

## 2023-11-28 NOTE — Progress Notes (Signed)
 Per [place]  Georgina NP, patient did not meet criteria for inpatient psych admission, however patient was recommended for detox/substance treatment. Per Virginia  NP, pt is agreeable to detox with residential rehab.    Per Ole DRAFTS [facility] contacted [facility]Fac[facility]-Based Crisis [facility]) and spoke to Norleen [phone] ext [phone]).  John stated once pt is discharged, pt can come to to [facility]  for a clinical and nursing assessment. [facility] is open 24/7.  Patient should bring all medications that he is currently on with him.  Patient will be at [facility] up to 23hrs.  If meet criteria for the [facility], the stay can be from 5 to 10 days.  Patient's RN Darold) was updated with all details.  Address:  [facility])                 [address]                 [place], [place] [address]   Michial Skeen, [place] [phone]

## 2023-11-28 NOTE — ED Notes (Signed)
Provided lunch tray

## 2023-11-28 NOTE — ED Provider Notes (Signed)
 5:07 PM Assumed care for off going team.   Blood pressure (!) 114/56, pulse 80, temperature 98.6 F (37 C), temperature source Oral, resp. rate 16, height 5' 3 (1.6 m), weight 59 kg, SpO2 97%.  See their HPI for full report but in brief pending substance abuse program.   Patient was recommended for detox as patient was cleared from a psychiatric perspective.  Discussed with suzen that she spoke to Norleen stated once pt is discharged, pt can come to to [facility] for a clinical and nursing assessment. [facility] is open 24/7. Patient should bring all medications that he is currently on with him.   Discussed  with patient he denies any HI, SI.  No current hallucinations.  He reports using acid.  He does report wanting to go to a detox area and feels comfortable with going here.  He reports that he has someone who can pick him up.  He reports that he already has his medications at home and that he can have someone take him to go pick them up and then go to this detox area.  He has got no active signs of withdrawal and he feels comfortable with this plan and psychiatry had cleared him earlier tonight.    Ernest Ronal BRAVO, MD 11/28/23 [id number]

## 2023-11-28 NOTE — Discharge Instructions (Addendum)
 Please go home and pick up your medications your Abilify  and your Prozac  so that the doctors can give you these while you are at the substance abuse program.  Then go to the address below it is open 24/7 and they will do evaluation to see how long you meet criteria to be there for.  Sometimes it can be 24 hours and sometimes it can be longer.   Address:  [facility])                 [address]                 [place], [place] [address]

## 2023-12-05 DIAGNOSIS — R443 Hallucinations, unspecified: Secondary | ICD-10-CM | POA: Diagnosis not present

## 2023-12-05 DIAGNOSIS — R45851 Suicidal ideations: Secondary | ICD-10-CM | POA: Diagnosis not present

## 2023-12-05 NOTE — ED Notes (Signed)
 Patient A&Ox4, respirations even and unlabored on RA. Copy of discharge instructions given to pt. Or responsible adult who demonstrated the ability to learn, asked appropriate questions and verbalized understanding of the plan of care. Direction was explicitly given to return to the ER if any symptoms were to worsen or new issues or concerns were to arise. There was clear understanding of these instructions without any barriers to understanding identified by me and the discussion was well received. All questions were answered to full satisfaction. Pt. Able to ambulate independently to lobby with steady gait. Pt. Discharged to follow up at [facility]

## 2023-12-05 NOTE — ED Triage Notes (Signed)
 Pt here due to having concerns in relation to his psych medicaitons. Pt states that he does not want to live but that he does not have a plan or intent for SI. Pt states that he has not had his medications in apx 1 month and states that he isnt sure if his medications need to be changed. Pt states hx of depression and schizophrenia.  A+O x 4 GCS 15

## 2023-12-06 DIAGNOSIS — F332 Major depressive disorder, recurrent severe without psychotic features: Secondary | ICD-10-CM | POA: Diagnosis not present

## 2023-12-18 DIAGNOSIS — F29 Unspecified psychosis not due to a substance or known physiological condition: Secondary | ICD-10-CM | POA: Diagnosis not present

## 2023-12-18 DIAGNOSIS — R6884 Jaw pain: Secondary | ICD-10-CM | POA: Diagnosis not present

## 2023-12-18 DIAGNOSIS — F259 Schizoaffective disorder, unspecified: Secondary | ICD-10-CM | POA: Diagnosis not present

## 2023-12-19 DIAGNOSIS — R6884 Jaw pain: Secondary | ICD-10-CM | POA: Diagnosis not present

## 2023-12-20 DIAGNOSIS — R45851 Suicidal ideations: Secondary | ICD-10-CM | POA: Diagnosis not present

## 2023-12-20 DIAGNOSIS — F259 Schizoaffective disorder, unspecified: Secondary | ICD-10-CM | POA: Diagnosis not present

## 2023-12-20 DIAGNOSIS — F29 Unspecified psychosis not due to a substance or known physiological condition: Secondary | ICD-10-CM | POA: Diagnosis not present

## 2023-12-20 DIAGNOSIS — R9431 Abnormal electrocardiogram [ECG] [EKG]: Secondary | ICD-10-CM | POA: Diagnosis not present

## 2023-12-20 DIAGNOSIS — R440 Auditory hallucinations: Secondary | ICD-10-CM | POA: Diagnosis not present

## 2023-12-20 NOTE — Consults (Addendum)
 [facility] Consultation Note  Consults    Chief Complaint / Context of Consult: Auditory Hallucinations and Suicidal Ideation  HPI: 23 yo Edward Shelton with PMHx of schizoaffective disorder presents to the ED for auditory hallucinations, suicidal ideations, and medication refills. Patient states that he is at his baseline suicidal ideation and does not have a plan nor means to attempt suicide, though states he would attempt if he did have the means to do so. Endorses auditory hallucinations that are whispers which sometimes say his name. They are not command in nature.  He is usually seen at [facility] for psychiatric care but does not see a therapist due to financial strain. He has been out of his psychiatric medications (prozac  and abilify ) for a week, and says he came to the ED for medication refills.   Psychiatric/Substance Use/Social History  Past Psychiatric History:  Previous Psychiatric Diagnoses: schizoaffective disorder History of Inpatient Psychiatric Hospitalizations: yes Past Medication Trials:  Abilify /aripiprazole , Prozac /fluoxetine , and Remeron /mirtazapine  History of Suicide Attempts: no History of Non-Suicidal Self-Harm: unknown  Substance Use History:   reports that he has been smoking cigarettes. He has never used smokeless tobacco. He reports current alcohol use. He reports current drug use.   Social History:    Social History   Social History Narrative   Social Documentation      Social History (Last Updated/Reviewed by Mliss MARLA Howell, LCMHCS on 12/20/23)   Living Arrangement: Lives in a trailer by himself   Marital Status: single   Children: single   Education: Unknown   Current School: [employer]   Income/Employment/Disability: Unemployed adult, no disability    Social Support: None   Religious Affiliation: none,   Financial planner: no   Access to [employer]: no      History of Trauma: none ; Informant: the patient    Protective Services  Involvement: Unknown      Legal History (including incarceration history): possession of cocaine   Current/Pending Legal Charges and Court Dates: possession of cocaine   Location manager Name and Phone Number: yes but unknown      Behavioral Health / Substance Use Treatment History (Last Updated/Reviewed by Mliss MARLA Howell, LCMHCS on 12/20/23)   Previous Diagnoses: Schizoaffective, schizophrenia, depression with psychotic features, cocaine abuse   Current Outpatient Providers: Unknown   Previous Mental Health Treatment: inpatient and outpatient    Previous Substance Use Outpatient Attempts: Unknown   History of Inpatient Psychiatric Hospitalizations: yes   Past Medication Trials: Abilify  and prozac .  Does better on Abilify  injection then pill   Medication Compliance: No   pt reported that he is off his medications     History of Suicide Attempts: 1 previous attempt   History of Non-Suicidal Self-Harm: no    History of Homicidal Behavior: no   History of Physical Aggression/Violence/HI: no   History of Traumatic Brain Injury: Unknown   History of Intellectual/Developmental Disability? no      If IDD present:   Level of IDD: N/A    IQ Deficits: None   Adaptive deficits: None      Completed suicide in first degree relative: NO  Past Medical History:  Past Medical History[1]  Allergies:  Allergies[2]  Current Medications:  Scheduled Meds: PRN Meds:diphenhydrAMINE , diphenhydrAMINE , haloperidol , haloperidol  lactate, hydrOXYzine , lorazepam , melatonin, OLANZapine , OLANZapine , trazodone   Continuous Infusions:  Lab results reviewed: TSH  Date Value Ref Range Status  12/20/2023 1.75 0.45 - 5.33 uIU/mL Final    BMP / CMP:  Sodium  Date Value Ref  Range Status  12/20/2023 137 136 - 145 mmol/L Final   Potassium  Date Value Ref Range Status  12/20/2023 3.7 3.5 - 5.1 mmol/L Final   Chloride  Date Value Ref Range Status  12/20/2023 102 98 - 107 mmol/L Final   CO2  Date  Value Ref Range Status  12/20/2023 26 21 - 31 mmol/L Final   BUN  Date Value Ref Range Status  12/20/2023 14 7 - 25 mg/dL Final   Creatinine  Date Value Ref Range Status  12/20/2023 0.72 0.70 - 1.30 mg/dL Final   Glucose, Random  Date Value Ref Range Status  12/20/2023 101 70 - 199 mg/dL Final   Calcium, Total  Date Value Ref Range Status  12/20/2023 8.9 8.6 - 10.3 mg/dL Final   Osmolality (calculated)  Date Value Ref Range Status  12/20/2023 274 270 - 295 mOsm/kg Final   Anion Gap  Date Value Ref Range Status  12/20/2023 9 4 - 12 Final   Albumin  Date Value Ref Range Status  12/20/2023 4.1 3.5 - 5.7 g/dL Final   Bilirubin, Total  Date Value Ref Range Status  12/20/2023 1.2 (H) 0.3 - 1.0 mg/dL Final   Alkaline Phosphatase  Date Value Ref Range Status  12/20/2023 67 34 - 104 IU/L Final   ALT  Date Value Ref Range Status  12/20/2023 18 7 - 52 IU/L Final   AST  Date Value Ref Range Status  12/20/2023 15 13 - 39 IU/L Final   Protein, Total  Date Value Ref Range Status  12/20/2023 6.2 (L) 6.4 - 8.9 g/dL Final   Albumin/Globulin Ratio  Date Value Ref Range Status  12/20/2023 2.0 1.2 - 2.3 Final    CBC: WBC  Date Value Ref Range Status  12/20/2023 10.9 3.6 - 11.2 K/uL Final   RBC  Date Value Ref Range Status  12/20/2023 4.87 4.06 - 5.63 M/uL Final   Hemoglobin  Date Value Ref Range Status  12/20/2023 14.6 12.5 - 16.3 g/dL Final   Hematocrit  Date Value Ref Range Status  12/20/2023 42 37 - 47 % Final   Mean Cell Volume  Date Value Ref Range Status  12/20/2023 86 74 - 96 fL Final   Mean Cell Hemoglobin  Date Value Ref Range Status  12/20/2023 30 24 - 33 pg Final   Mean Cell Hemoglobin Concentration  Date Value Ref Range Status  12/20/2023 35 33 - 36 g/dL Final   RDW  Date Value Ref Range Status  12/20/2023 13.9 12.3 - 17.0 % Final   Platelet Count  Date Value Ref Range Status  12/20/2023 175 150 - 450 K/uL Final   Mean Platelet  Volume  Date Value Ref Range Status  12/20/2023 10.5 7.5 - 11.2 fL Final      Vital Signs: Vitals:   12/20/23 1005  BP: 117/71  Pulse: 84  Resp: 16  Temp: 98.7 F (37.1 C)  SpO2: 100%    Mental Status Examination: Appearance/consciousness: resting on arrival, but arousable and cooperative, NAD Orientation: grossly oriented based on interview Behavior: avoids eye contact, cooperative, and mildly uninterested Speech: paucity Language: No aphasia, normal Movement/Motor: fidgeting and laying in bed Eye contact: poor Mood: okay Affect: blunted Attention/Concentration: attending to conversation Thought Process: coherent but vague Thought Content: suicidal ideation Perceptual disturbances: Auditory and no response to internal stimuli Recent and Remote Memory: intact Insight: fair  Judgment: fair  Intellectual functioning estimate: average Gait/Station: lying in bed  Diagnosis:  Assessment & Plan Unspecified  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 Referral to psychiatry. Psychological condition  will be reassessed as needed.  No associated orders from this encounter found during lookback period of 72 hours.     Assessment:  Edward Shelton is a 23 y.o., Other race, Hispanic or Latino ethnicity,  English speaking male with a history significant for schizoaffective disorder, who initially presented to [facility] on 12/20/2023  1:09 AM due to suicidal ideation and auditory hallucinations.  Psychiatry consulted for suicidal ideation and auditory hallucinations. Patient is at baseline SI and came in today for medication refill after being out of Prozac  and Abilify  for a week. Given active SI, pursuing inpatient treatment. He has been accepted for inpatient psychiatric treatment.   Legal:  - Patient is voluntary - Patient does not have a legal guardian.  - Legal Guardian: Data Unavailable     Treatment  Recommendations: Safety precautions:  - 1:1 sitter for active SI  Medications:  - No changes to medications at this time. Plan for inpatient team to manage medications  - Agitation protocol not initiated due to no concern at this time. - I have reviewed PDMP data and Narx scores/ ORS scores reviewed. Score of 000.   Behavioral Recommendations:  -Encourage patient to seek CBT therapy and follow-up with [facility] following inpatient treatment   Lab and Other Studies:  -TSH wnl and negative UDS  Disposition Planning:  - This patient needs admission to an inpatient psychiatric facility.    - Patient will return to prior living situation on discharge. .    Thank you for allowing us  to participate in the care of your patient. Psychiatry will see this patient as needed by the primary team. Please contact us  when questions arise.    Edsel Jori Banter, DO        [1] Past Medical History: Diagnosis Date  . Alcohol abuse   . Drug abuse (CMS/HCC v28)   . Schizoaffective disorder (CMS/HCC v28)   [2] No Known Allergies

## 2023-12-20 NOTE — ED Provider Notes (Signed)
 [facility] Emergency Department Provider Note Room: [facility]  [id number]/AHALL04  Medical Decision Making   Assessment/Plan   Edward Shelton is a 23 year old male with a past medical history of schizoaffective disorder, alcohol abuse, and substance abuse presenting to the emergency room for auditory hallucinations and suicidal ideations.  On exam, he is well-appearing and in no distress.  Hemodynamically stable and afebrile.  His medical exam is unremarkable.  Will obtain screening labs and have behavioral health evaluate the patient.   Progress Notes   3:00 PM Evaluated by behavioral health who recommends inpatient psychiatric admission under voluntary status.  6:00 AM Exam unchanged.  Medically cleared, behavioral health made aware  6:50 AM Care signed over to oncoming provider.   MDM Elements Discussion of Management with other Physicians, QHP or Appropriate Source: Consultant: Behavioral health  Independent Interpretation of Studies: EKG(s): Sinus rhythm with no acute ischemic changes or arrhythmias  Social Drivers that significantly affected care: Substance abuse   Disposition   Clinical Impression:     Diagnosis Comment Added By Time Added   Suicidal ideations  Bernardino Belvie Gell, PA-C 12/20/2023  6:50 AM   Auditory hallucinations  Bernardino Belvie Hausfeld, PA-C 12/20/2023  6:50 AM      Final Disposition: [facility] Pending   History   Chief Complaint in Triage  Patient presents with  . Hallucinations, auditory or visual    Pt reports being off medication for a week and endorses auditory hallucinations. Denies voices are telling him to hurt himself or others, he can only hear whispers. When asked Pt SI questions Pt states the voices make me want to die but I don't want to hurt myself.    History of Present Illness   Edward Shelton is a 23 year old male with a past medical history of schizoaffective disorder, alcohol abuse, and substance abuse  presenting to the emergency room for suicidal ideations and auditory hallucinations.  He reports auditory hallucinations that are telling him to kill himself.  He says he really does not want to kill himself.  He has been noncompliant with his medications.  He denies visual hallucinations.  Denies recent drug or alcohol use.  He has no other complaints.   Addition Historian(s): None     MEDICATIONS:  Patient's Medications  Previous Medications   CYCLOBENZAPRINE (FLEXERIL) 10 MG TABLET    Take 1 tablet (10 mg total) by mouth 3 (three) times a day as needed.  Modified Medications   No medications on file    ALLERGIES: Allergies[1]  PAST MEDICAL HISTORY:  Past Medical History[2]  PAST SURGICAL HISTORY: Past Surgical History[3]  SOCIAL HISTORY:  Social History   Tobacco Use  . Smoking status: Some Days    Types: Cigarettes  . Smokeless tobacco: Never  Substance Use Topics  . Alcohol use: Yes    Comment: sometimes     Social Drivers of Health with Concerns   Tobacco Use: High Risk (12/20/2023)   FAMILY HISTORY:  Family History[4]    Physical Exam    ED Triage Vitals [12/20/23 0039]  Temp 98.5 F (36.9 C)  Temp Source Oral  Heart Rate 86  BP 121/68  Respirations 16  SpO2 100 %   Reviewed vital signs and nursing note as charted by RN.  Physical Exam         Adult Medical Physical Exam Reviewed vital signs and nursing note as charted by RN.   CONSTITUTIONAL: Well-appearing and in no distress.  Alert and oriented and  responds appropriately to questions.  HEAD: Normocephalic; atraumatic EYES: PERRL, EOMI; Conjunctivae clear, sclerae non-icteric ENT: Normal nose; no rhinorrhea; moist mucous membranes NECK: Supple without meningismus CARD: Regular rate and rhythm; no murmurs; symmetric distal pulses RESP: Normal chest excursion without splinting or tachypnea; breath sounds clear and equal bilaterally ABD/GI: Nondistended; soft, nontender EXT: Normal ROM in all  joints; no edema SKIN: Normal color for age and race; warm; dry; good turgor; capillary refill < 2 seconds; no acute lesions noted NEURO: Moves all extremities equally; Motor and sensory function intact  PSYCH: The patient's mood and manner are appropriate. Grooming and personal hygiene are appropriate.   Results    I personally reviewed the results in the chart as listed below  Results for orders placed or performed during the hospital encounter of 12/20/23  CMP  Result Value Ref Range   Sodium 137 136 - 145 mmol/L   Potassium 3.7 3.5 - 5.1 mmol/L   Chloride 102 98 - 107 mmol/L   CO2 26 21 - 31 mmol/L   BUN 14 7 - 25 mg/dL   Creatinine 9.27 9.29 - 1.30 mg/dL   Glucose, Random 898 70 - 199 mg/dL   Calcium, Total 8.9 8.6 - 10.3 mg/dL   Osmolality (calculated) 274 270 - 295 mOsm/kg   Anion Gap 9 4 - 12   Albumin 4.1 3.5 - 5.7 g/dL   Bilirubin, Total 1.2 (H) 0.3 - 1.0 mg/dL   Alkaline Phosphatase 67 34 - 104 IU/L   ALT 18 7 - 52 IU/L   AST 15 13 - 39 IU/L   Protein, Total 6.2 (L) 6.4 - 8.9 g/dL   Albumin/Globulin Ratio 2.0 1.2 - 2.3  TSH  Result Value Ref Range   TSH 1.75 0.45 - 5.33 uIU/mL  Alcohol, Ethyl Level  Result Value Ref Range   Alcohol, Ethyl <10 0 - 80 mg/dL  Acetaminophen  Level  Result Value Ref Range   Acetaminophen  Level <10 10 - 30 ug/mL  Salicylate Level  Result Value Ref Range   Salicylate <3 (L) 6 - 30 mg/dL  Urine Drug of Abuse Screen  Result Value Ref Range   Amphetamine, Urine Screen NEGATIVE    Barbiturate, Urine Screen NEGATIVE    Benzodiazepines, Urine Screen NEGATIVE    Cannabinoids, Urine Screen NEGATIVE    Cocaine, Urine Screen NEGATIVE    Methadone, Urine Screen NEGATIVE    Opiate, Urine Screen NEGATIVE    Oxycodone, Urine Screen NEGATIVE    Fentanyl, Urine Screen NEGATIVE   eGFR (CKD-EPI)  Result Value Ref Range   eGFR >60 mL/min/1.69m2  Urinalysis Complete  Result Value Ref Range   Color LT YELLOW Lt Yellow or Yellow   Urine  Clarity CLEAR Clear   Urine Specific Gravity 1.011 1.003 - 1.035   Urine pH 7.0 5.0 - 8.0   Urine Albumin NEGATIVE Negative   Urine Glucose Screen NEGATIVE Negative   Urine Ketones NEGATIVE Negative   Urine Bilirubin NEGATIVE Negative   Urine Hemoglobin MODERATE (A) Negative   Urine Leukocyte Esterase NEGATIVE Negative Leu/uL   Urine Nitrite NEGATIVE Negative   Urine Urobilinogen 2 (A) 0.2 - 1.0 mg/dL   Urine Bacteria TRACE Trace /HPF   Urine RBC 3-20 (A) <3 /HPF   Urine WBC <5 <5 /HPF   Urine Squamous Epithelial Cells <10 <10 /HPF  Urine Drug Screen Comment  Result Value Ref Range   Drug Screen Comment See Text   CBC with Differential  Result Value Ref Range  Differential Percent Diff %    Differential Absolute Diff Absolute    WBC 10.9 3.6 - 11.2 K/uL   RBC 4.87 4.06 - 5.63 M/uL   Hemoglobin 14.6 12.5 - 16.3 g/dL   Hematocrit 42 37 - 47 %   Mean Cell Volume 86 74 - 96 fL   Mean Cell Hemoglobin 30 24 - 33 pg   Mean Cell Hemoglobin Concentration 35 33 - 36 g/dL   RDW 86.0 87.6 - 82.9 %   Platelet Count 175 150 - 450 K/uL   Mean Platelet Volume 10.5 7.5 - 11.2 fL   Neutrophils 72 43 - 77 %   Lymphocytes 17 16 - 44 %   Monocytes 10 5 - 13 %   Eosinophils 2 1 - 8 %   Basophils 1 0 - 1 %   Neutrophils Absolute 7.8 1.8 - 7.8 K/uL   Lymphocytes Absolute 1.8 1.0 - 3.0 K/uL   Monocytes Absolute 1.0 0.3 - 1.0 K/uL   Eosinophils Absolute 0.2 0.0 - 0.5 K/uL   Basophils Absolute 0.1 0.0 - 0.1 K/uL   Nucleated RBCS 0 /100 WBC   Imaging Results   None    ECG Results   None          Bernardino Dover Hausfeld, PA-C 12/20/23 0650      [1] No Known Allergies [2] Past Medical History: Diagnosis Date  . Alcohol abuse   . Drug abuse (CMS/HCC v28)   . Schizoaffective disorder (CMS/HCC v28)   [3] History reviewed. No pertinent surgical history. [4] History reviewed. No pertinent family history.

## 2023-12-21 DIAGNOSIS — F332 Major depressive disorder, recurrent severe without psychotic features: Secondary | ICD-10-CM | POA: Diagnosis not present

## 2023-12-22 DIAGNOSIS — F332 Major depressive disorder, recurrent severe without psychotic features: Secondary | ICD-10-CM | POA: Diagnosis not present

## 2023-12-23 DIAGNOSIS — F332 Major depressive disorder, recurrent severe without psychotic features: Secondary | ICD-10-CM | POA: Diagnosis not present

## 2023-12-24 ENCOUNTER — Ambulatory Visit (HOSPITAL_COMMUNITY)
Admission: EM | Admit: 2023-12-24 | Discharge: 2023-12-24 | Disposition: A | Discharge status: Home / Self Care | Attending: Behavioral Health | Admitting: Behavioral Health

## 2023-12-24 DIAGNOSIS — Z7689 Persons encountering health services in other specified circumstances: Secondary | ICD-10-CM

## 2023-12-24 DIAGNOSIS — F1910 Other psychoactive substance abuse, uncomplicated: Secondary | ICD-10-CM | POA: Insufficient documentation

## 2023-12-24 DIAGNOSIS — Z008 Encounter for other general examination: Secondary | ICD-10-CM | POA: Insufficient documentation

## 2023-12-24 NOTE — ED Provider Notes (Signed)
 Behavioral Health Urgent Care Medical Screening Exam  Patient Name: Edward Shelton MRN: [id number] Date of Evaluation: 12/24/23 Chief Complaint:  I need an evaluation for a court case on October 25 th.  Diagnosis:  Final diagnoses:  Encounter for psychiatric assessment  Substance abuse (HCC)    History of Present illness: Edward Shelton is a 23 y.o. male patient with a past psychiatric history significant for MDD, schizoaffective, and polysubstance abuse who presented to the [facility] voluntary stating that he has a court case for trafficking cocaine.   On approach, patient states, I need an evaluation for a court case on October 25 (2025) for trafficking cocaine. I explained to the patient that we do not complete court ordered psychiatric evaluation and that we complete crisis evaluation. He states that he would like to complete the crisis evaluation so that it can help him in court and prove that he had a drug and mental health problem before his drug charges. He states that he's been in a crisis for 2.5 years, since 2023. He states that everyday he feels like he wants to die and feels like demons are controlling his thoughts. He denies active auditory or visual hallucinations. He denies paranoia. Objectively, no signs or evidence that the patient is acutely psychotic or responding to internal or external stimuli. He endorses passive thoughts of death and denies active suicidal thoughts of wanting to kill himself. He reports a suicide attempt kind of and states that in August of last year he took 20 oxycodone pills. Per chart review, he had a drug overdose in July 2023. He denies self harm behaviors. He denies HI. He states that he was prescribed Abilify , Prozac  and Mirtazapine  but he stopped taking his medications months ago because they do not work. He states that he goes to [facility] for medication management and that his appointments are online and  not until next month. He reports a lot of inpatient hospitalizations and states that he was last hospitalized last year. Per chart review, patient was seen at [facility] in [place] on 12/20/23 and was recommended for inpatient psychiatric treatment and also hospitalized at [facility] on 11/09/23 for inpatient psychiatric treatment. Patient states that he would go to the hospital when he was going to test positive for drugs and skip his probation appointments and get a new probation date. He reports using cocaine since last year, ketamine since he was 23 years old, and MDMA since last year. He states that he last used drugs earlier today. He reports drinking alcohol once a week, a beat box. He denies past rehabilitation and states that he does not know if he wants to go to rehab. I discussed with patient following up with outpatient counseling for substance abuse treatment. Patient agreeable to plan. He is unemployed. He lives in a trailer in [place]. Safety planning competed prior to discharge. Patient provided with a copy of AVS to provide to the court, per request. Patient informed that he will have to request a copy of the evaluation from [facility] medical records department.   Plan of care:  Patient's presentation of is most consistent with substance abuse.  Most recently hospitalized in September 2025, per chart review.  Medication regimen included:Abilify , Mirtazapine  and Prozac . Patient reports not taking medications.  Outpatient tx. [place]. Will provide patient with resources for substance abuse treatment/counseling Plan of care/disposition/recommendations: Patient does not appear to be at imminent risk of dangerousness to self or others at this  time. While future psychiatric events cannot be accurately predicted, the patient does not necessitate nor desire further acute inpatient psychiatric care at this time. The patient does not meet [place]  involuntary commitment criteria at this time. Patient  presents with risk factors that includes mental health disorder, chronic SI and substance use. These are mitigated by protective factors which include lack of active active SI/HI, no evidence of previous suicide attempts, no known access to weapons or firearms, sense of responsibility to family (mother), presence of an available support system, and expresses purpose for living.   Flowsheet Row ED from 12/24/2023 in [facility] ED from 11/27/2023 in [facility] Emergency Department at [facility] Admission (Discharged) from 11/09/2023 in [facility] INPATIENT BEHAVIORAL MEDICINE  C-SSRS RISK CATEGORY Moderate Risk Low Risk Low Risk    Psychiatric Specialty Exam  Presentation  General Appearance:Appropriate for Environment  Eye Contact:Fair  Speech:Clear and Coherent  Speech Volume:Normal  Handedness:Right   Mood and Affect  Mood: Euthymic  Affect: Congruent   Thought Process  Thought Processes: Coherent  Descriptions of Associations:Intact  Orientation:Full (Time, Place and Person)  Thought Content:Logical  Diagnosis of Schizophrenia or Schizoaffective disorder in past: No   Hallucinations:None  Ideas of Reference:None  Suicidal Thoughts:No  Homicidal Thoughts:No   Sensorium  Memory: Immediate Fair; Recent Fair; Remote Fair  Judgment: Intact  Insight: Present   Executive Functions  Concentration: Fair  Attention Span: Fair  Recall: [employer] of Knowledge: Fair  Language: Fair   Psychomotor Activity  Psychomotor Activity: Normal   Assets  Assets: Manufacturing systems engineer; Desire for Improvement; Financial Resources/Insurance; Housing; Leisure Time; Physical Health   Sleep  Sleep: Fair   Physical Exam: Physical Exam HENT:     Head: Atraumatic.  Eyes:     Conjunctiva/sclera: Conjunctivae normal.  Cardiovascular:     Rate and Rhythm: Normal rate.  Pulmonary:     Effort: Pulmonary effort is normal.   Musculoskeletal:        General: Normal range of motion.     Cervical back: Normal range of motion.  Neurological:     Mental Status: He is alert and oriented to person, place, and time.    Review of Systems  HENT: Negative.    Respiratory: Negative.    Cardiovascular: Negative.   Gastrointestinal: Negative.   Genitourinary: Negative.   Musculoskeletal: Negative.   Neurological: Negative.   Psychiatric/Behavioral:  Positive for substance abuse.    Blood pressure 130/79, pulse 98, temperature 98.6 F (37 C), temperature source Oral, resp. rate 16, SpO2 95%. There is no height or weight on file to calculate BMI.  Musculoskeletal: Strength & Muscle Tone: within normal limits Gait & Station: normal Patient leans: N/A   [facility] MSE Discharge Disposition for Follow up and Recommendations: Based on my evaluation the patient does not appear to have an emergency medical condition and can be discharged with resources and follow up care in outpatient services for Medication Management and Individual Therapy  Discharge recommendations:   Medications: Patient is to take medications as prescribed. The patient or patient's guardian is to contact a medical professional and/or outpatient provider to address any new side effects that develop. The patient or the patient's guardian should update outpatient providers of any new medications and/or medication changes.   Outpatient Follow up: Please review list of outpatient resources for psychiatry and counseling. Please follow up with your primary care provider for all medical related needs.   Therapy: We recommend that patient participate  in individual therapy to address mental health concerns.  Safety:   The following safety precautions should be taken:   No sharp objects. This includes scissors, razors, scrapers, and putty knives.   Chemicals should be removed and locked up.   Medications should be removed and locked up.   Weapons should  be removed and locked up. This includes firearms, knives and instruments that can be used to cause injury.   The patient should abstain from use of illicit substances/drugs and abuse of any medications.  If symptoms worsen or do not continue to improve or if the patient becomes actively suicidal or homicidal then it is recommended that the patient return to the closest hospital emergency department, the [facility], or call 911 for further evaluation and treatment. National Suicide Prevention Lifeline 1-800-SUICIDE or 910 325 0011.  About 988 988 offers 24/7 access to trained crisis counselors who can help people experiencing mental health-related distress. People can call or text 988 or chat 988lifeline.org for themselves or if they are worried about a loved one who may need crisis support.   Community Resource Guide Outpatient Counseling/Substance Abuse Adult The [employer]'s "U5235577" is a great source of information about community services available.  Access by dialing 2-1-1 from anywhere in [place] , or by website -  PooledIncome.pl.   Other Local Resources (Updated 02/2016)  Crisis Hotlines   Services     Area Served  [employer] Crisis Hotline, available 24 hours a day, 7 days a week: (314)181-8081 [facility], [place]  [facility] Crisis Hotline, available 24 hours a day, 7 days a week: (707)400-1458 [facility], [place]  [facility] Suicide Prevention Hotline, available 24 hours a day, 7 days a week: 830-710-1372 [facility], [place]  [employer], available 24 hours a day, 7 days a week: (512)784-0932 or (614)321-7797 [facility], [place]   [facility] Access to [employer], available 24 hours a day, 7 days a week: 559 397 3662 All   [facility] Crisis Hotline, available 24 hours a day, 7 days a week: 574-653-5083 All   Other Local Resources (Updated  02/2016)  Outpatient Counseling/ Substance Abuse Programs  Services     Address and Phone Number  ADS ([facility]cohol [facility] Drug Services)  Options include Individual counseling, group counseling, intensive outpatient program (several hours a day, several days a week) Offers depression assessments Provides methadone maintenance program [phone] [address]  [address], [address] [place], [place] [address]   [facility]  Offers partial hospitalization/day treatment and DUI/DWI programs [employer], private insurance [phone] [address], [address] [place], [place] [address]  [facility] [place], [place], private insurance [phone] [address] [place], [place]  [facility]   Services include intensive outpatient program (several hours a day, several days a week), outpatient treatment, DUI/DWI services, family education Also has some services specifically for [employer] transitional housing  [phone] [address] [place], [place] [address]     [facility] Accepts Medicare, private pay, and private insurance [phone] [address], [address] [place], [place] [address]  [employer] of Care Services include individual counseling, substance abuse intensive outpatient program (several hours a day, several days a week), day treatment Florinda Many, Medicaid, private insurance [phone] [address], [address] [place], [place] [address]  [facility]  Offers substance abuse intensive outpatient program (several hours a day, several days a week), partial hospitalization program [phone]  [address] [place], [place] [address]  [phone] [address] [place], [place] [address]  [phone] [address] [place], [place] [address]  [phone], [address] [place], [place] [address]  [facility] Individual counseling only Accepts private insurance only [phone] [address] [place], [place] [address]  [facility] Methadone maintenance program [phone] [address] [place], [place] [address]  [facility] providing substance abuse and mental health counseling Accepts Medicaid, Medicare, private insurance Offers sliding scale for uninsured [phone] [address] [place], [place]   [facility] [place], [employer]. Offers individual counseling, and intensive in-home services [phone] [address] [place], [place] [address]  [facility]	[place] individual counseling, family counseling, group therapy, domestic violence counseling, consumer credit counseling Accepts Medicare, Medicaid, private insurance Offers sliding scale for uninsured [phone] [address] [place], [place] [address]  [phone] [facility], [address] [place], [place] [address]  [facility] Offers individual, family and group counseling 3 locations - [place], [place], and [place]  [phone]  [address]  [place], [place] [address]  [address] [place], [place] [address]  [address] [place], [place] [address]  [facility]   Offers psychiatric assessment, 8-week Intensive Outpatient Program (several hours a day, several times a week, daytime or evenings), early recovery group, family Program, medication management Private pay or private insurance only [phone], or  [phone] [address] [place], [place] [address]  [employer] individual, couples and family counseling Accepts Medicaid, private insurance, and sliding scale for uninsured [phone] [address] [place], [place] [address]  Alm Riding, MD Individual counseling Private insurance [phone] [address] [place], [place] [address]  [facility]  Offers assessment, substance abuse treatment, and behavioral health treatment [phone] [address] [place], [place] [address]  [facility] Individual counseling Accepts private insurance [phone] [address] [place], [place] [address]  [facility] Individual counseling Florinda Many, private insurance [phone] [address] [place], [place] [address]  [facility]   Serves children and adolescents ages 84 to 28 years of age Offers 90-day or longer intensive outpatient programs (3 hours a day, 3 days a week) Cost is $5000/month. Facility is out of network with all insurance companies. Accepts private pay. Does not accept Medicaid.   Offers scholarships and financial assistance [phone] [facility] [place], [place]  [facility] Individual counseling Medicare, private insurance [phone] [address], [address] [place], [place] [address]  [facility]   Offers intensive outpatient program (several hours a day, several times a week) and partial hospitalization program [phone] [address] [place], [place] [address]  Elodie Anon, MD Individual counseling [phone] [address] [place], [place] [address]  [facility] Offers Christian counseling to individuals, couples, and families Accepts Medicare and private insurance; offers sliding scale for uninsured [phone] [address] [place], [place] [address]  [facility] counseling [phone] [address] [place], [place] [address]  [facility] [employer] crisis counseling, individual counseling, group therapy, in-home therapy, domestic violence services, day treatment, DWI services, Administrator, arts (CST), Doctor, hospital (ACTT), substance abuse Intensive Outpatient  Program (several hours a day, several times a week) 2 locations - [place] and [place] [phone] [address] [place], [place] [address]  [phone] [address] [place], [place] [address]  [facility]    Individual counseling and group therapy Accepts private insurance, [place], [place] [phone] [address] [place], [place]  [facility] Offers individual and family counseling Offers LGBTQ services Accepts private insurance and private pay [phone] [address] [place], [place] [address]  [facility] Full service practice for adult and pediatric patients Monday-Friday; 8:00a - 5:00p [phone] (Multiple locations)  [facility] [employer] individual counseling, group therapy, and outpatient detox Accepts private insurance [phone] [address] [place], [place]  [facility] Individual counseling Accepts Medicare, private insurance [phone] [address] [place], [place] [address]  [employer] Individual counseling Accepts Medicare, private insurance [phone] [address] [place], [place] [address]  Valerio Hamm[facility]  Offers substance abuse Intensive Outpatient Program (several hours a day, several times a week) [phone], or [phone] [place], [place]     [place], [place] L, NP 12/24/2023, 5:52 PM

## 2023-12-24 NOTE — Discharge Instructions (Signed)
 Discharge recommendations:   Medications: Patient is to take medications as prescribed. The patient or patient's guardian is to contact a medical professional and/or outpatient provider to address any new side effects that develop. The patient or the patient's guardian should update outpatient providers of any new medications and/or medication changes.   Outpatient Follow up: Please review list of outpatient resources for psychiatry and counseling. Please follow up with your primary care provider for all medical related needs.   Therapy: We recommend that patient participate in individual therapy to address mental health concerns.  Safety:   The following safety precautions should be taken:   No sharp objects. This includes scissors, razors, scrapers, and putty knives.   Chemicals should be removed and locked up.   Medications should be removed and locked up.   Weapons should be removed and locked up. This includes firearms, knives and instruments that can be used to cause injury.   The patient should abstain from use of illicit substances/drugs and abuse of any medications.  If symptoms worsen or do not continue to improve or if the patient becomes actively suicidal or homicidal then it is recommended that the patient return to the closest hospital emergency department, the [facility] or call 911 for further evaluation and treatment. National Suicide Prevention Lifeline 1-800-SUICIDE or 251-787-7460.  About 988 988 offers 24/7 access to trained crisis counselors who can help people experiencing mental health-related distress. People can call or text 988 or chat 988lifeline.org for themselves or if they are worried about a loved one who may need crisis support.   Community Resource Guide Outpatient Counseling/Substance Abuse Adult The United Way's "S3741234" is a great source of information about community services available.  Access by dialing 2-1-1 from  anywhere in [place] , or by website -  PooledIncome.pl.   Other Local Resources (Updated 02/2016)  Crisis Hotlines   Services     Area Served  [employer] Crisis Hotline, available 24 hours a day, 7 days a week: (218)717-0679 [facility], [place]  [facility] Crisis Hotline, available 24 hours a day, 7 days a week: 567-719-2477 [facility], [place]  [facility] Suicide Prevention Hotline, available 24 hours a day, 7 days a week: 435 090 0178 [facility], [place]  [employer], available 24 hours a day, 7 days a week: 564-494-2238 or 423-220-7394 [facility], [place]   [facility] Access to [employer], available 24 hours a day, 7 days a week: (930)045-3783 All   [facility] Crisis Hotline, available 24 hours a day, 7 days a week: 310-590-8938 All   Other Local Resources (Updated 02/2016)  Outpatient Counseling/ Substance Abuse Programs  Services     Address and Phone Number  ADS (Alcohol and Drug Services)  Options include Individual counseling, group counseling, intensive outpatient program (several hours a day, several days a week) Offers depression assessments Provides methadone maintenance program [phone] [address], [address] [place], [place] [address]   [facility]  Offers partial hospitalization/day treatment and DUI/DWI programs [employer], private insurance [phone] [address] [place], [place] [address]  [facility] [place], [place], private insurance [phone] [address] [place], [place]  [facility]   Services include intensive outpatient program (several hours a day, several days a week), outpatient treatment, DUI/DWI services, family education Also has some services specifically for [employer] transitional housing  [phone] [address] [place], [place] [address]  [facility] Accepts Medicare, private pay, and private insurance [phone] [address] [place], [address] [place], [place] [address]  [employer] of Care Services include individual counseling, substance abuse intensive outpatient program (several hours a day, several days a week), day treatment Florinda Many, Medicaid, private insurance [phone] [address], [address] [place], [place] [address]  [facility]  Offers substance abuse intensive outpatient program (several hours a day, several days a week), partial hospitalization program [phone] [address] [place], [place] [address]  [phone] [address] [place], [place] [address]  [phone] [address] [place], [place] [address]  [phone], [address] [place], [place] [address]  [facility] Individual counseling only Accepts private insurance only [phone] [address] [place], [place] [address]  [facility] Methadone maintenance program [phone] [address] [place], [place] [address]  [facility] providing substance abuse and mental health counseling Accepts Medicaid, Medicare, private insurance Offers sliding scale for uninsured [phone] [address] [place], [place]   [facility] [place], [employer]. Offers individual counseling, and intensive in-home services [phone] [address], [address] [place], [place] [address]  [facility]	[place] individual counseling, family counseling, group therapy, domestic violence counseling, consumer credit counseling Accepts Medicare, Medicaid, private insurance Offers sliding scale for uninsured [phone] [address] [place], [place] [address]  [phone] [facility], [address] [place], [place] [address]  [facility] Offers individual, family and  group counseling 3 locations - [place], [place], and [place]  [phone]  [address]  [place], [place] [address]  [address] [place], [place] [address]  [address] [place], [place] [address]  [facility]   Offers psychiatric assessment, 8-week Intensive Outpatient Program (several hours a day, several times a week, daytime or evenings), early recovery group, family Program, medication management Private pay or private insurance only [phone], or  [phone] [address] [place], [place] [address]  [employer] individual, couples and family counseling Accepts Medicaid, private insurance, and sliding scale for uninsured [phone] [address] [place], [place] [address]  Alm Riding, MD Individual counseling Private insurance [phone] [address] [place], [place] [address]  [facility]  Offers assessment, substance abuse treatment, and behavioral health treatment [phone] [place], [place] [address]  [facility] Individual counseling Accepts private insurance [phone] [address] [place], [place] [address]  [facility] Individual counseling Florinda Many, private insurance [phone] [address] [place], [place] [address]  [facility]   Serves children and adolescents ages 45 to 14 years of age Offers 90-day or longer intensive outpatient programs (3 hours a day, 3 days a week) Cost is $5000/month. Facility is out of network with all insurance companies. Accepts private pay. Does not accept Medicaid.   Offers scholarships and financial assistance [phone] [facility] [place], [place]  [facility] Individual counseling Medicare, private insurance [phone] [address] [place], [place] [address]  [facility]   Offers intensive  outpatient program (several hours a day, several times a week) and partial hospitalization program [phone] [address] [place], [place] [address]  Elodie Anon, MD Individual counseling [phone] [address] [place], [place] [address]  [facility] Offers Christian counseling to individuals, couples, and families Accepts Medicare and private insurance; offers sliding  scale for uninsured [phone] [address] [place], [place] [address]  [facility] counseling [phone] [address] [place], [place] [address]  [facility] [employer] crisis counseling, individual counseling, group therapy, in-home therapy, domestic violence services, day treatment, DWI services, Administrator, arts (CST), Doctor, hospital (ACTT), substance abuse Intensive Outpatient Program (several hours a day, several times a week) 2 locations - [place] [phone] [address] [place], [place] [address]  [phone] [address] [place], [place] [address]  [facility]    Individual counseling and group therapy [employer], [place], [place] [phone] [address] [place], [place]  [facility] Offers individual and family counseling Offers LGBTQ services Accepts private insurance and private pay [phone] [address] [place], [place] [address]  [facility] Full service practice for adult and pediatric patients Monday-Friday; 8:00a - 5:00p [phone] (Multiple locations)  [facility] [employer] individual counseling, group therapy, and outpatient detox Accepts private insurance [phone] [address] [place], [place]  [facility] Individual counseling Accepts Medicare, private insurance [phone] [address] [place], [place] [address]  [employer] Individual counseling Accepts Medicare, private insurance [phone] [address] [place], [place] [address]  [facility]  Offers substance abuse Intensive Outpatient Program (several hours a day, several times a week) [phone], or [phone] [place], [place]

## 2023-12-24 NOTE — Progress Notes (Signed)
   12/24/23 1625  [facility] Triage Screening (Walk-ins at [facility] only)  How Did You Hear About Us ?  [employer])  What Is the Reason for Your Visit/Call Today? PT Edward Shelton 22Y male presents to [facility] accompanied by his mother, voluntarily. 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 PT does not take any prescribed medications, stated he stopped taking his meds last year. PT states he is here today for his probation. PT has court cases due to trafficking cocaine. PT states he has a poly-drug problem. PT states he gets suicidal when he stops using drugs; he states his withdrawal symptoms are psychological. PT endorses SI with no plan. PT denies HI, AVH and alcohol abuse.  How Long Has This Been Causing You Problems? > than 6 months  Have You Recently Had Any Thoughts About Hurting Yourself? No (PT states I'm not a child, I'm not going to harm myself, I think about killing myself)  Are You Planning to Commit Suicide/Harm Yourself At This time? No  Have you Recently Had Thoughts About Hurting Someone Sher[place]? No  Are You Planning To Harm Someone At This Time? No  Physical Abuse Denies  Verbal Abuse Denies  Sexual Abuse Denies  Exploitation of patient/patient's resources Denies  Self-Neglect Yes, present (Comment)  Are you currently experiencing any auditory, visual or other hallucinations? No  Have You Used Any Alcohol or Drugs in the Past 24 Hours? Yes  What Did You Use and How Much? Cocaine (2g - within the last 6hrs)  Do you have any current medical co-morbidities that require immediate attention? No  Clinician description of patient physical appearance/behavior: flat, cooperative  What Do You Feel Would Help You the Most Today? Alcohol or Drug Use Treatment;Treatment for Depression or other mood problem (PT states Just the rehab)  If access to [facility] was not available, would you have sought care in the Emergency Department? Yes  Determination of Need Urgent  (48 hours)  Options For Referral Chemical Dependency Intensive Outpatient Therapy (CDIOP);Facility-Based Crisis;Medication Management;Intensive Outpatient Therapy;Outpatient Therapy;BH Urgent Care  Determination of Need filed? Yes

## 2024-02-17 ENCOUNTER — Emergency Department
Admission: EM | Admit: 2024-02-17 | Discharge: 2024-02-18 | Disposition: A | Discharge status: Home / Self Care | Attending: Emergency Medicine | Admitting: Emergency Medicine

## 2024-02-17 DIAGNOSIS — F32A Depression, unspecified: Secondary | ICD-10-CM | POA: Diagnosis present

## 2024-02-17 DIAGNOSIS — F419 Anxiety disorder, unspecified: Secondary | ICD-10-CM | POA: Insufficient documentation

## 2024-02-17 DIAGNOSIS — F1920 Other psychoactive substance dependence, uncomplicated: Secondary | ICD-10-CM | POA: Diagnosis not present

## 2024-02-17 DIAGNOSIS — F330 Major depressive disorder, recurrent, mild: Secondary | ICD-10-CM | POA: Insufficient documentation

## 2024-02-17 DIAGNOSIS — R9431 Abnormal electrocardiogram [ECG] [EKG]: Secondary | ICD-10-CM | POA: Diagnosis not present

## 2024-02-17 DIAGNOSIS — F1914 Other psychoactive substance abuse with psychoactive substance-induced mood disorder: Secondary | ICD-10-CM | POA: Diagnosis not present

## 2024-02-17 DIAGNOSIS — F1994 Other psychoactive substance use, unspecified with psychoactive substance-induced mood disorder: Secondary | ICD-10-CM

## 2024-02-17 DIAGNOSIS — R45851 Suicidal ideations: Secondary | ICD-10-CM | POA: Diagnosis not present

## 2024-02-17 DIAGNOSIS — F39 Unspecified mood [affective] disorder: Secondary | ICD-10-CM | POA: Diagnosis not present

## 2024-02-17 LAB — URINALYSIS, ROUTINE W REFLEX MICROSCOPIC
Bilirubin Urine: NEGATIVE
Glucose, UA: NEGATIVE mg/dL
Hgb urine dipstick: NEGATIVE
Ketones, ur: NEGATIVE mg/dL
Leukocytes,Ua: NEGATIVE
Nitrite: NEGATIVE
Protein, ur: NEGATIVE mg/dL
Specific Gravity, Urine: 1.025 (ref 1.005–1.030)
pH: 6 (ref 5.0–8.0)

## 2024-02-17 LAB — COMPREHENSIVE METABOLIC PANEL WITH GFR
ALT: 21 U/L (ref 0–44)
AST: 20 U/L (ref 15–41)
Albumin: 4.6 g/dL (ref 3.5–5.0)
Alkaline Phosphatase: 82 U/L (ref 38–126)
Anion gap: 11 (ref 5–15)
BUN: 13 mg/dL (ref 6–20)
CO2: 25 mmol/L (ref 22–32)
Calcium: 9.4 mg/dL (ref 8.9–10.3)
Chloride: 101 mmol/L (ref 98–111)
Creatinine, Ser: 0.67 mg/dL (ref 0.61–1.24)
GFR, Estimated: >60 mL/min (ref >60)
Glucose, Bld: 99 mg/dL (ref 70–99)
Potassium: 4 mmol/L (ref 3.5–5.1)
Sodium: 138 mmol/L (ref 135–145)
Total Bilirubin: 0.6 mg/dL (ref 0.0–1.2)
Total Protein: 7.3 g/dL (ref 6.5–8.1)

## 2024-02-17 LAB — CBC WITH DIFFERENTIAL/PLATELET
Abs Immature Granulocytes: 0.06 K/uL (ref 0.00–0.07)
Basophils Absolute: 0.1 K/uL (ref 0.0–0.1)
Basophils Relative: 0 %
Eosinophils Absolute: 0.1 K/uL (ref 0.0–0.5)
Eosinophils Relative: 0 %
HCT: 43.3 % (ref 39.0–52.0)
Hemoglobin: 15.2 g/dL (ref 13.0–17.0)
Immature Granulocytes: 1 %
Lymphocytes Relative: 24 %
Lymphs Abs: 2.9 K/uL (ref 0.7–4.0)
MCH: 30.2 pg (ref 26.0–34.0)
MCHC: 35.1 g/dL (ref 30.0–36.0)
MCV: 86.1 fL (ref 80.0–100.0)
Monocytes Absolute: 0.7 K/uL (ref 0.1–1.0)
Monocytes Relative: 6 %
Neutro Abs: 8.4 K/uL — ABNORMAL HIGH (ref 1.7–7.7)
Neutrophils Relative %: 69 %
Platelets: 238 K/uL (ref 150–400)
RBC: 5.03 MIL/uL (ref 4.22–5.81)
RDW: 14 % (ref 11.5–15.5)
WBC: 12.2 K/uL — ABNORMAL HIGH (ref 4.0–10.5)
nRBC: 0 % (ref 0.0–0.2)

## 2024-02-17 LAB — ETHANOL: Alcohol, Ethyl (B): <15 mg/dL (ref <15)

## 2024-02-17 MED ORDER — LORAZEPAM 1 MG PO TABS
1.0000 mg | ORAL_TABLET | Freq: Once | ORAL | Status: AC
  Administered 2024-02-17: 1 mg via ORAL
  Filled 2024-02-17: qty 1

## 2024-02-17 NOTE — ED Notes (Signed)
 Pt's belongings include: Pair of black socks Khaki joggers Stage Manager with primary school teacher

## 2024-02-17 NOTE — ED Triage Notes (Addendum)
 Pt arrived to ED d/t suicidal ideation. Per pt, pt stated that he has been in a reoccurring state of depression and SI after he gets sent to a rehab center and then gets released. Pt states that he thinks it is caused from the amount of drugs he used to do. Per pt, pt stated that he now just wants to be involuntarily held and be given help for a prolonged period of time because he states the amount of time he spends at [facility] is not enough.

## 2024-02-17 NOTE — ED Provider Notes (Signed)
 [facility] Provider Note    Event Date/Time   First MD Initiated Contact with Patient 02/17/24 2122     (approximate)   History   Suicidal   HPI  Edward Shelton is a 23 y.o. male who presents to the ED for evaluation of Suicidal   I reviewed various ED visits and psychiatric evaluations at multiple hospital systems in the region.  History of polysubstance abuse, SI and substance-induced mood disorder  Patient presents for evaluation of anxiety and suicidal thoughts in the setting of polysubstance abuse.  Reports using ketamine and cocaine frequently these days, cocaine earlier this evening.  He did not want to go home by himself so presents to the ED voluntarily.  Reports frequently crying at night for multiple hours before falling asleep  No formulated plans to harm himself   Physical Exam   Triage Vital Signs: ED Triage Vitals  Encounter Vitals Group     BP 02/17/24 2104 124/79     Girls Systolic BP Percentile --      Girls Diastolic BP Percentile --      Boys Systolic BP Percentile --      Boys Diastolic BP Percentile --      Pulse Rate 02/17/24 2104 85     Resp 02/17/24 2104 18     Temp 02/17/24 2104 97.9 F (36.6 C)     Temp Source 02/17/24 2104 Oral     SpO2 02/17/24 2104 100 %     Weight 02/17/24 2105 130 lb (59 kg)     Height 02/17/24 2105 5' 3 (1.6 m)     Head Circumference --      Peak Flow --      Pain Score 02/17/24 2105 0     Pain Loc --      Pain Education --      Exclude from Growth Chart --     Most recent vital signs: Vitals:   02/17/24 2104  BP: 124/79  Pulse: 85  Resp: 18  Temp: 97.9 F (36.6 C)  SpO2: 100%    General: Awake, no distress.  CV:  Good peripheral perfusion.  Resp:  Normal effort.  Abd:  No distention.  MSK:  No deformity noted.  Neuro:  No focal deficits appreciated. Other:     ED Results / Procedures / Treatments   Labs (all labs ordered are listed, but only abnormal results are  displayed) Labs Reviewed  URINALYSIS, ROUTINE W REFLEX MICROSCOPIC - Abnormal; Notable for the following components:      Result Value   Color, Urine YELLOW (*)    APPearance CLEAR (*)    All other components within normal limits  CBC WITH DIFFERENTIAL/PLATELET - Abnormal; Notable for the following components:   WBC 12.2 (*)    Neutro Abs 8.4 (*)    All other components within normal limits  COMPREHENSIVE METABOLIC PANEL WITH GFR  ETHANOL  URINE DRUG SCREEN    EKG Sinus rhythm with a rate of 74 bpm.  Normal axis and intervals.  Signs of LVH.  No STEMI  RADIOLOGY   Official radiology report(s): No results found.  PROCEDURES and INTERVENTIONS:  Procedures  Medications  LORazepam  (ATIVAN ) tablet 1 mg (has no administration in time range)  LORazepam  (ATIVAN ) tablet 1 mg (1 mg Oral Given 02/17/24 2131)     IMPRESSION / MDM / ASSESSMENT AND PLAN / ED COURSE  I reviewed the triage vital signs and the nursing notes.  Differential  diagnosis includes, but is not limited to, overdose, suicide attempt, malingering, substance-induced mood disorder  {Patient presents with symptoms of an acute illness or injury that is potentially life-threatening.  Patient presents with vague suicidal thoughts in the setting of cocaine abuse.  No chest pain or shortness of breath, no signs of further overdose.  Will provide oral Ativan .  Mild leukocytosis but I doubt infectious etiology of his symptoms.  Clear UA and normal metabolic panel.  Will keep him voluntary and consult psychiatry  Clinical Course as of 02/17/24 2242  Fri Feb 17, 2024  2241 The patient has been placed in psychiatric observation due to the need to provide a safe environment for the patient while obtaining psychiatric consultation and evaluation, as well as ongoing medical and medication management to treat the patient's condition.  The patient has not been placed under full IVC at this time.    [DS]    Clinical Course User  Index [DS] Claudene Rover, MD     FINAL CLINICAL IMPRESSION(S) / ED DIAGNOSES   Final diagnoses:  Substance induced mood disorder (HCC)  Suicidal thoughts     Rx / DC Orders   ED Discharge Orders     None        Note:  This document was prepared using Dragon voice recognition software and may include unintentional dictation errors.   Claudene Rover, MD 02/17/24 [phone]

## 2024-02-17 NOTE — ED Notes (Signed)
Patient given sandwich tray. 

## 2024-02-17 NOTE — ED Notes (Signed)
Dr. Smith

## 2024-02-17 NOTE — ED Notes (Addendum)
 This tech provided interpreter services for pt family member (mom) to give information regarding where she can locate and contact the magistrates office. This tech provided sticky note with this information.

## 2024-02-18 DIAGNOSIS — F32A Depression, unspecified: Secondary | ICD-10-CM

## 2024-02-18 DIAGNOSIS — F1920 Other psychoactive substance dependence, uncomplicated: Secondary | ICD-10-CM

## 2024-02-18 LAB — URINE DRUG SCREEN
Amphetamines: POSITIVE — AB
Barbiturates: NEGATIVE
Benzodiazepines: NEGATIVE
Cocaine: POSITIVE — AB
Fentanyl: NEGATIVE
Methadone Scn, Ur: NEGATIVE
Opiates: NEGATIVE
Tetrahydrocannabinol: NEGATIVE

## 2024-02-18 NOTE — ED Notes (Signed)
 Breakfast tray provided for pt.

## 2024-02-18 NOTE — Consult Note (Signed)
 [facility] Consult Note  Patient Name: Edward Shelton MRN: [id number] DOB: 2000/09/19 DATE OF Consult: 02/18/2024  PRIMARY PSYCHIATRIC DIAGNOSES  1.  Depressive disorder-NOS, cluster B traits r/o BPD 2.  Polysubstance dependence 3.    RECOMMENDATIONS  Recommendations: Medication recommendations: would defer to outpatient medication provider Non-Medication/therapeutic recommendations: routine observation, involve SW to assist with outpatient f/u and safety planning, consider outpatient chem dep programs locally, return to ER if he experiences SI. Is inpatient psychiatric hospitalization recommended for this patient? No (Explain why): Pt.'s SI is chronic and he is able to engage in safety and disposition planning.  Current symptoms are primarily related to substance use and he would benefit more from targeted chem dep treatment than inpatient psychiatry.  Inpatient psychiatry treatment is also likely to be short term which pt. has stated explicitly is not helpful. Is another care setting recommended for this patient? (examples may include Crisis Stabilization Unit, Residential/Recovery Treatment, ALF/SNF, Memory Care Unit)  Yes (Explain why): potentially inpatient or outpatient chem dep or dual diagnosis program. From a psychiatric perspective, is this patient appropriate for discharge to an outpatient setting/resource or other less restrictive environment for continued care?  Yes (Explain why): pt. Can engage in outpatient care. Follow-Up Telepsychiatry C/L services: We will sign off for now. Please re-consult our service if needed for any concerning changes in the patient's condition, discharge planning, or questions. Communication: Treatment team members (and family members if applicable) who were involved in treatment/care discussions and planning, and with whom we spoke or engaged with via secure text/chat, include the following: nursing staff via video chat, treatment team via  chat  Thank you for involving us  in the care of this patient. If you have any additional questions or concerns, please call [phone] and ask for me or the provider on-call.  TELEPSYCHIATRY ATTESTATION & CONSENT  As the provider for this telehealth consult, I attest that I verified the patient's identity using two separate identifiers, introduced myself to the patient, provided my credentials, disclosed my location, and performed this encounter via a HIPAA-compliant, real-time, face-to-face, two-way, interactive audio and video platform and with the full consent and agreement of the patient (or guardian as applicable.)  Patient physical location: ED at [facility] Tele[facility] provider physical location: home office in state of [place].  Video start time: 0900 (Central Time) Video end time: 0941 [facility] Time)  IDENTIFYING DATA  Edward Shelton is a 23 y.o. year-old male for whom a psychiatric consultation has been ordered by the primary provider. The patient was identified using two separate identifiers.  CHIEF COMPLAINT/REASON FOR CONSULT  Suicidal Ideation  HISTORY OF PRESENT ILLNESS (HPI)  The patient is a 23 y/o HM presenting to ER at his own request, brought in by mother, he was apparently in a more lethargic state in ER last night with slurred words, made several comments about his heavy substance use.  Today he is calm and slightly withdrawn, engaging but giving monosyllabic answers.  He reports when asked how we can help him: I don't know.  He reports that he's been having passive SI and depressed mood for the past 3 years, increasing in frequency and intensity to several times per week over the past 3-4 weeks but denies any intent or plan.  He denies hx of significant anxiety symptoms.  He reports intermittent sleep.  Denies ever being to rehab though he apparently told the [facility] the opposite.  He reports he hasn't typically found inpatient treatment helpful but this is  because  it's too short and states: they never keep you more than 14 days and appears to be looking for something more long-term.  He is able to engage in safety and disposition planning.  He reports his medications worked ok but not as good as drugs thought.  He denies VH though he apparently told the [facility]AS the opposite, notes hx of AH but upon questioning these seem to be intrusive, derogatory thoughts toward himself.  He is not responding to internal stimuli and is alert and fully-oriented.  He denies current S/H ideation or A/V hallucinations and has no other concerns at this time.  PAST PSYCHIATRIC HISTORY  Pt. denies suicide attempts but notes several instances of self-cutting of forearms.  He reports he's been to inpatient psychiatry approximately 5 times, last 5-6 months ago, usually for 7-14 days, reports it's not helpful because it's not long enough.  He denies ever going to [employer].  He reports he does not currently have an outpatient psychiatrist and his previous outpatient contacts have been intermittent.  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  He notes hx of taking abilify , prozac , trazodone , remeron , vistaril , guanfacine .  He has been off all of his medications, which he notes were somewhat helpful, for at least the past several months. Otherwise as per HPI above.  PAST MEDICAL HISTORY  Past Medical History:  Diagnosis Date   Depression      HOME MEDICATIONS  Facility Ordered Medications  Medication   [COMPLETED] LORazepam  (ATIVAN ) tablet 1 mg   [COMPLETED] LORazepam  (ATIVAN ) tablet 1 mg   PTA Medications  Medication Sig   traZODone  (DESYREL ) 50 MG tablet Take 50 mg by mouth at bedtime. (Patient not taking: Reported on 02/18/2024)   mirtazapine  (REMERON ) 15 MG tablet Take 15 mg by mouth at bedtime. (Patient not taking: Reported on 02/18/2024)   hydrOXYzine  (VISTARIL ) 25 MG capsule Take 25 mg by mouth every 6 (six) hours as needed for anxiety. (Patient not  taking: Reported on 02/18/2024)   guanFACINE  (INTUNIV ) 2 MG TB24 ER tablet Take 2 mg by mouth daily. (Patient not taking: Reported on 02/18/2024)    ALLERGIES  No Known Allergies  SOCIAL & SUBSTANCE USE HISTORY  Social History   Socioeconomic History   Marital status: Single    Spouse name: Not on file   Number of children: Not on file   Years of education: Not on file   Highest education level: Not on file  Occupational History   Not on file  Tobacco Use   Smoking status: Every Day    Types: Cigarettes   Smokeless tobacco: Current  Vaping Use   Vaping status: Every Day  Substance and Sexual Activity   Alcohol use: Not Currently   Drug use: Yes    Types: Marijuana, IV    Comment: shrooms, MDMA, ketamine   Sexual activity: Yes  Other Topics Concern   Not on file  Social History Narrative   Not on file   Social Drivers of Health   Financial Resource Strain: Not on file  Food Insecurity: Low Risk (12/20/2023)   [facility]   Food Insecurity    Within the past 12 months, did the food you bought just not last and you didn't have money to get more?: No    Within the past 12 months, did you worry that your food would run out before you got money to buy more?: No  Transportation Needs: Low Risk (  12/20/2023)   Received from [facility]   Transportation Needs    Within the past 12 months, has a lack of transportation kept you from medical appointments or from doing things needed for daily living?: No  Physical Activity: Not on file  Stress: Not on file  Social Connections: Not on file   Social History   Tobacco Use  Smoking Status Every Day   Types: Cigarettes  Smokeless Tobacco Current   Social History   Substance and Sexual Activity  Alcohol Use Not Currently   Social History   Substance and Sexual Activity  Drug Use Yes   Types: Marijuana, IV   Comment: shrooms, MDMA, ketamine    Additional pertinent information:  Currently lives alone, not currently working, reports he's supported by his mother.  He reports he uses ketamine, cocaine every day I try to do it.  He reports social alcohol use, nicotine  use daily.  Denies other substance use but UDS positive for amphetamines as well.  FAMILY HISTORY  Family History  Problem Relation Age of Onset   Healthy Mother    Healthy Father    Family Psychiatric History (if known):  Pt. Denies family history of mental illness of suicides.  MENTAL STATUS EXAM (MSE)  Mental Status Exam: General Appearance: Well Groomed  Orientation:  Full (Time, Place, and Person)  Memory:  Immediate;   Good Recent;   Good Remote;   Good  Concentration:  Concentration: Good and Attention Span: Good  Recall:  Good  Attention  Good  Eye Contact:  Good  Speech:  Normal Rate  Language:  Good  Volume:  Normal  Mood: depressed  Affect:  Constricted  Thought Process:  Coherent  Thought Content:  Negative  Suicidal Thoughts:  Yes.  without intent/plan  Homicidal Thoughts:  No  Judgement:  Fair  Insight:  Fair  Psychomotor Activity:  Negative  Akathisia:  No  Fund of Knowledge:  Fair    Assets:  Desire for Improvement Housing Physical Health Social Support  Cognition:  WNL  ADL's:  Intact  AIMS (if indicated):       VITALS  Blood pressure 110/65, pulse 75, temperature 98.4 F (36.9 C), temperature source Oral, resp. rate 15, height 5' 3 (1.6 m), weight 59 kg, SpO2 98%.  LABS  Admission on 02/17/2024  Component Date Value Ref Range Status   Color, Urine 02/17/2024 YELLOW (A)  YELLOW Final   APPearance 02/17/2024 CLEAR (A)  CLEAR Final   Specific Gravity, Urine 02/17/2024 1.025  1.005 - 1.030 Final   pH 02/17/2024 6.0  5.0 - 8.0 Final   Glucose, UA 02/17/2024 NEGATIVE  NEGATIVE mg/dL Final   Hgb urine dipstick 02/17/2024 NEGATIVE  NEGATIVE Final   Bilirubin Urine 02/17/2024 NEGATIVE  NEGATIVE Final   Ketones, ur 02/17/2024 NEGATIVE  NEGATIVE mg/dL Final    Protein, ur 88/85/7974 NEGATIVE  NEGATIVE mg/dL Final   Nitrite 88/85/7974 NEGATIVE  NEGATIVE Final   Leukocytes,Ua 02/17/2024 NEGATIVE  NEGATIVE Final   Performed at [facility], [address] [place], [place] [address]   WBC 02/17/2024 12.2 (H)  4.0 - 10.5 K/uL Final   RBC 02/17/2024 5.03  4.22 - 5.81 MIL/uL Final   Hemoglobin 02/17/2024 15.2  13.0 - 17.0 g/dL Final   HCT 88/85/7974 43.3  39.0 - 52.0 % Final   MCV 02/17/2024 86.1  80.0 - 100.0 fL Final   MCH 02/17/2024 30.2  26.0 - 34.0 pg Final   MCHC 02/17/2024 35.1  30.0 - 36.0 g/dL Final   RDW 88/85/7974 14.0  11.5 - 15.5 % Final   Platelets 02/17/2024 238  150 - 400 K/uL Final   nRBC 02/17/2024 0.0  0.0 - 0.2 % Final   Neutrophils Relative % 02/17/2024 69  % Final   Neutro Abs 02/17/2024 8.4 (H)  1.7 - 7.7 K/uL Final   Lymphocytes Relative 02/17/2024 24  % Final   Lymphs Abs 02/17/2024 2.9  0.7 - 4.0 K/uL Final   Monocytes Relative 02/17/2024 6  % Final   Monocytes Absolute 02/17/2024 0.7  0.1 - 1.0 K/uL Final   Eosinophils Relative 02/17/2024 0  % Final   Eosinophils Absolute 02/17/2024 0.1  0.0 - 0.5 K/uL Final   Basophils Relative 02/17/2024 0  % Final   Basophils Absolute 02/17/2024 0.1  0.0 - 0.1 K/uL Final   Immature Granulocytes 02/17/2024 1  % Final   Abs Immature Granulocytes 02/17/2024 0.06  0.00 - 0.07 K/uL Final   Performed at [facility], [address] [place], [place] [address]   Sodium 02/17/2024 138  135 - 145 mmol/L Final   Potassium 02/17/2024 4.0  3.5 - 5.1 mmol/L Final   Chloride 02/17/2024 101  98 - 111 mmol/L Final   CO2 02/17/2024 25  22 - 32 mmol/L Final   Glucose, Bld 02/17/2024 99  70 - 99 mg/dL Final   Glucose reference range applies only to samples taken after fasting for at least 8 hours.   BUN 02/17/2024 13  6 - 20 mg/dL Final   Creatinine, Ser 02/17/2024 0.67  0.61 - 1.24 mg/dL Final   Calcium 88/85/7974 9.4  8.9 - 10.3 mg/dL Final   Total Protein 88/85/7974 7.3  6.5  - 8.1 g/dL Final   Albumin 88/85/7974 4.6  3.5 - 5.0 g/dL Final   AST 88/85/7974 20  15 - 41 U/L Final   ALT 02/17/2024 21  0 - 44 U/L Final   Alkaline Phosphatase 02/17/2024 82  38 - 126 U/L Final   Total Bilirubin 02/17/2024 0.6  0.0 - 1.2 mg/dL Final   GFR, Estimated 02/17/2024 >60  >60 mL/min Final   Comment: (NOTE) Calculated using the CKD-EPI Creatinine Equation (2021)    Anion gap 02/17/2024 11  5 - 15 Final   Performed at [facility], [address] [place], [place] [address]   Alcohol, Ethyl (B) 02/17/2024 <15  <15 mg/dL Final   Comment: (NOTE) For medical purposes only. Performed at [facility], [address] [place], [place] [address]    Opiates 02/17/2024 NEGATIVE  NEGATIVE Final   Cocaine 02/17/2024 POSITIVE (A)  NEGATIVE Final   Benzodiazepines 02/17/2024 NEGATIVE  NEGATIVE Final   Amphetamines 02/17/2024 POSITIVE (A)  NEGATIVE Final   Tetrahydrocannabinol 02/17/2024 NEGATIVE  NEGATIVE Final   Barbiturates 02/17/2024 NEGATIVE  NEGATIVE Final   Methadone Scn, Ur 02/17/2024 NEGATIVE  NEGATIVE Final   Fentanyl 02/17/2024 NEGATIVE  NEGATIVE Final   Comment: (NOTE) Drug screen is for Medical Purposes only. Positive results are preliminary only. If confirmation is needed, notify lab within 5 days.  Drug Class                 Cutoff (ng/mL) Amphetamine and metabolites 1000 Barbiturate and metabolites 200 Benzodiazepine              200 Opiates and metabolites     300 Cocaine and metabolites     300 THC  50 Fentanyl                    5 Methadone                   300  Trazodone  is metabolized in vivo to several metabolites,  including pharmacologically active m-CPP, which is excreted in the  urine.  Immunoassay screens for amphetamines and MDMA have potential  cross-reactivity with these compounds and may provide false positive  result.  Performed at [facility], [address] [place], [place]  [address]     PSYCHIATRIC REVIEW OF SYSTEMS (ROS)  ROS: Notable for the following relevant positive findings: ROS  Additional findings:      Musculoskeletal: No abnormal movements observed      Gait & Station: Laying/Sitting      Pain Screening: Denies      Nutrition & Dental Concerns: no issues noted  RISK FORMULATION/ASSESSMENT  Is the patient experiencing any suicidal or homicidal ideations: Yes       Explain if yes: chronic passive SI, no intent or plan noted Protective factors considered for safety management: family support, desire for treatment  Risk factors/concerns considered for safety management:  Depression Substance abuse/dependence Male gender Unmarried  Is there a safety management plan with the patient and treatment team to minimize risk factors and promote protective factors: Yes           Explain:  Is crisis care placement or psychiatric hospitalization recommended: No     Based on my current evaluation and risk assessment, patient is determined at this time to be at:  Moderate Risk  *RISK ASSESSMENT Risk assessment is a dynamic process; it is possible that this patient's condition, and risk level, may change. This should be re-evaluated and managed over time as appropriate. Please re-consult psychiatric consult services if additional assistance is needed in terms of risk assessment and management. If your team decides to discharge this patient, please advise the patient how to best access emergency psychiatric services, or to call 911, if their condition worsens or they feel unsafe in any way.   Bernardino LITTIE Erm, MD Telepsychiatry Consult Services

## 2024-02-18 NOTE — ED Provider Notes (Signed)
 Emergency Medicine Observation Re-evaluation Note  Edward Shelton is a 23 y.o. male, awaiting psych dispo  Physical Exam  BP 110/65 (BP Location: Left Arm)   Pulse 75   Temp 98.4 F (36.9 C) (Oral)   Resp 15   Ht 5' 3 (1.6 m)   Wt 59 kg   SpO2 98%   BMI 23.03 kg/m  Physical Exam General: calm   ED Course / MDM  Results reviewed, no significant abnormality.   Plan  Current plan is for placement.    Floy Roberts, MD 02/18/24 [phone]

## 2024-02-18 NOTE — ED Notes (Signed)
Patient assigned to appropriate care area. Patient oriented to unit/care area: Informed that, for their safety, care areas are designed for safety and monitored by security cameras at all times; and visiting hours explained to patient. Patient verbalizes understanding, and verbal contract for safety obtained. 

## 2024-02-18 NOTE — ED Notes (Signed)
 Lunch tray provided to pt.

## 2024-02-18 NOTE — BH Assessment (Signed)
 Comprehensive Clinical Assessment (CCA) Screening, Triage and Referral Note  02/18/2024 Edward Shelton [id number] Edward Shelton, English speaking, Hispanic male. Pt presented to [facility] ED voluntary. Per triage note: Pt arrived to ED d/t suicidal ideation. Per pt, pt stated that he has been in a reoccurring state of depression and SI after he gets sent to a rehab center and then gets released. Pt states that he thinks it is caused from the amount of drugs he used to do. Per pt, pt stated that he now just wants to be involuntarily held and be given help for a prolonged period of time because he states the amount of time he spends at [facility] is not enough.  On assessment, pt. presented with slurred incoherent speech. Pt was lethargic and reticent throughout the assessment. Pt was forthcoming about his problematic substance abuse.  When asked what brought pt. to the ED the pt. admitted to recently having an escalation of his SI stating, "I've been having it off and on for the last 3 years". Pt explained that he last used cocaine prior to arrival; amount unknown. Pt identified frequent nightmares as his main stressor. Pt positive for cocaine and cannabis. Pt continues to endorse current SI. Pt denied HI. Pt admits to hallucinating stating, "I see random, dumb stuff."  Chief Complaint:  Chief Complaint  Patient presents with   Suicidal   Visit Diagnosis: 1.  Substance Induced Psychosis 2.  Polysub Dependence  3.  MDD, recurrent, mild   Patient Reported Information How did you hear about us ? Self  What Is the Reason for Your Visit/Call Today? Pt arrived to ED d/t suicidal ideation. Per pt, pt stated that he has been in a reoccurring state of depression and SI after he gets sent to a rehab center and then gets released. Pt states that he thinks it is caused from the amount of drugs he used to do. Per pt, pt stated that he now just wants to be involuntarily held and be given help for a  prolonged period of time because he states the amount of time he spends at [facility] is not enough.  How Long Has This Been Causing You Problems? > than 6 months  What Do You Feel Would Help You the Most Today? Alcohol or Drug Use Treatment; Social Support   Have You Recently Had Any Thoughts About Hurting Yourself? Yes  Are You Planning to Commit Suicide/Harm Yourself At This time? No   Have you Recently Had Thoughts About Hurting Someone [place]? No  Are You Planning to Harm Someone at This Time? No  Explanation: Pt admits to having chronic SI without a plan for the last 3 years.   Have You Used Any Alcohol or Drugs in the Past 24 Hours? Yes  How Long Ago Did You Use Drugs or Alcohol? 02/17/24 Drug use  What Did You Use and How Much? Cocaine   Do You Currently Have a Therapist/Psychiatrist? No  Name of Therapist/Psychiatrist: No data recorded  Have You Been Recently Discharged From Any Office Practice or Programs? -- (UTA)  Explanation of Discharge From Practice/Program: No data recorded   CCA Screening Triage Referral Assessment Type of Contact: Face-to-Face  Telemedicine Service Delivery:   Is this Initial or Reassessment?   Date Telepsych consult ordered in [facility]:    Time Telepsych consult ordered in [facility]:    Location of Assessment: [facility] ED  Provider Location: [facility] ED    Collateral Involvement: None provided   Does  Patient Have a Automotive Engineer Guardian? No data recorded Name and Contact of Legal Guardian: No data recorded If Minor and Not Living with Parent(s), Who has Custody? N/A  Is CPS involved or ever been involved? Never  Is APS involved or ever been involved? Never   Patient Determined To Be At Risk for Harm To Self or Others Based on Review of Patient Reported Information or Presenting Complaint? No  Method: No Plan  Availability of Means: No access or NA  Intent: Vague intent or NA  Notification Required: No need or identified  person  Additional Information for Danger to Others Potential: -- (N/A)  Additional Comments for Danger to Others Potential: n/a  Are There Guns or Other Weapons in Your Home? No  Types of Guns/Weapons: n/a  Are These Weapons Safely Secured?                            No  Who Could Verify You Are Able To Have These Secured: n/a  Do You Have any Outstanding Charges, Pending Court Dates, Parole/Probation? UTA  Contacted To Inform of Risk of Harm To Self or Others: -- (N/A)   Does Patient Present under Involuntary Commitment? No    [place] of Residence: [place]   Patient Currently Receiving the Following Services: Not Receiving Services   Determination of Need: Urgent (48 hours)   Options For Referral: Facility-Based Crisis; Chemical Dependency Intensive Outpatient Therapy (CDIOP); Medication Management; Intensive Outpatient Therapy; [facility] Ur[facility] Care   Disposition Recommendation per psychiatric provider: Pending psych consult  Shauntavia Brackin R Jaclene Bartelt, LCAS

## 2024-02-24 ENCOUNTER — Other Ambulatory Visit: Payer: Self-pay

## 2024-02-24 ENCOUNTER — Emergency Department
Admission: EM | Admit: 2024-02-24 | Discharge: 2024-02-25 | Disposition: A | Discharge status: Other Acute Inpatient Hospital | Source: Home / Self Care | Attending: Emergency Medicine | Admitting: Emergency Medicine

## 2024-02-24 DIAGNOSIS — F39 Unspecified mood [affective] disorder: Secondary | ICD-10-CM | POA: Diagnosis present

## 2024-02-24 DIAGNOSIS — F10120 Alcohol abuse with intoxication, uncomplicated: Secondary | ICD-10-CM | POA: Insufficient documentation

## 2024-02-24 DIAGNOSIS — F330 Major depressive disorder, recurrent, mild: Secondary | ICD-10-CM | POA: Insufficient documentation

## 2024-02-24 DIAGNOSIS — F1410 Cocaine abuse, uncomplicated: Secondary | ICD-10-CM

## 2024-02-24 DIAGNOSIS — R45851 Suicidal ideations: Secondary | ICD-10-CM | POA: Insufficient documentation

## 2024-02-24 DIAGNOSIS — Y908 Blood alcohol level of 240 mg/100 ml or more: Secondary | ICD-10-CM | POA: Insufficient documentation

## 2024-02-24 DIAGNOSIS — F17210 Nicotine dependence, cigarettes, uncomplicated: Secondary | ICD-10-CM | POA: Insufficient documentation

## 2024-02-24 DIAGNOSIS — F10920 Alcohol use, unspecified with intoxication, uncomplicated: Secondary | ICD-10-CM

## 2024-02-24 LAB — CBC
HCT: 40.3 % (ref 39.0–52.0)
Hemoglobin: 14.3 g/dL (ref 13.0–17.0)
MCH: 30.4 pg (ref 26.0–34.0)
MCHC: 35.5 g/dL (ref 30.0–36.0)
MCV: 85.7 fL (ref 80.0–100.0)
Platelets: 210 K/uL (ref 150–400)
RBC: 4.7 MIL/uL (ref 4.22–5.81)
RDW: 13.9 % (ref 11.5–15.5)
WBC: 7.8 K/uL (ref 4.0–10.5)
nRBC: 0 % (ref 0.0–0.2)

## 2024-02-24 LAB — COMPREHENSIVE METABOLIC PANEL WITH GFR
ALT: 12 U/L (ref 0–44)
AST: 16 U/L (ref 15–41)
Albumin: 4 g/dL (ref 3.5–5.0)
Alkaline Phosphatase: 60 U/L (ref 38–126)
Anion gap: 13 (ref 5–15)
BUN: 7 mg/dL (ref 6–20)
CO2: 22 mmol/L (ref 22–32)
Calcium: 8.3 mg/dL — ABNORMAL LOW (ref 8.9–10.3)
Chloride: 110 mmol/L (ref 98–111)
Creatinine, Ser: 0.77 mg/dL (ref 0.61–1.24)
GFR, Estimated: >60 mL/min (ref >60)
Glucose, Bld: 139 mg/dL — ABNORMAL HIGH (ref 70–99)
Potassium: 3.4 mmol/L — ABNORMAL LOW (ref 3.5–5.1)
Sodium: 145 mmol/L (ref 135–145)
Total Bilirubin: <0.2 mg/dL (ref 0.0–1.2)
Total Protein: 6.1 g/dL — ABNORMAL LOW (ref 6.5–8.1)

## 2024-02-24 LAB — URINE DRUG SCREEN
Amphetamines: NEGATIVE
Barbiturates: NEGATIVE
Benzodiazepines: NEGATIVE
Cocaine: POSITIVE — AB
Fentanyl: NEGATIVE
Methadone Scn, Ur: NEGATIVE
Opiates: NEGATIVE
Tetrahydrocannabinol: NEGATIVE

## 2024-02-24 LAB — ETHANOL: Alcohol, Ethyl (B): 293 mg/dL — ABNORMAL HIGH (ref <15)

## 2024-02-24 LAB — SALICYLATE LEVEL: Salicylate Lvl: <7 mg/dL — ABNORMAL LOW (ref 7.0–30.0)

## 2024-02-24 LAB — ACETAMINOPHEN LEVEL: Acetaminophen (Tylenol), Serum: <10 ug/mL — ABNORMAL LOW (ref 10–30)

## 2024-02-24 NOTE — ED Notes (Signed)
 Gave pt cup of water and cup of ice.

## 2024-02-24 NOTE — ED Notes (Signed)
 Pt ABCs intact. RR even and unlabored. Pt in NAD. Bed in lowest locked position. Call bell in reach. Denies needs at this time.    Past Medical History:  Diagnosis Date   Depression

## 2024-02-24 NOTE — ED Triage Notes (Signed)
 Patient stumbled into ER, knocked over metal detector, placed into wheelchair via this RN. Patient appears to be under the influence of drugs/alcohol. Endorses drinking and dropping acid because he wants to die.  Seen previously 11/14 for similar.

## 2024-02-24 NOTE — ED Provider Notes (Signed)
 [facility] Provider Note    Event Date/Time   First MD Initiated Contact with Patient 02/24/24 2227     (approximate)   History   Alcohol Intoxication and Suicidal   HPI  Edward Shelton is a 23 y.o. male past medical history significant for substance use, who presents to the emergency department with suicidal thoughts and intoxication.  Patient states that he likes to use ketamine to drink.  Has been having passive suicidal thoughts but gets better whenever he takes ketamine.  States that he lives by himself.     Physical Exam   Triage Vital Signs: ED Triage Vitals  Encounter Vitals Group     BP 02/24/24 2210 109/64     Girls Systolic BP Percentile --      Girls Diastolic BP Percentile --      Boys Systolic BP Percentile --      Boys Diastolic BP Percentile --      Pulse Rate 02/24/24 2210 79     Resp 02/24/24 2210 19     Temp 02/24/24 2210 98.1 F (36.7 C)     Temp Source 02/24/24 2210 Oral     SpO2 02/24/24 2210 100 %     Weight 02/24/24 2157 130 lb 1.1 oz (59 kg)     Height 02/24/24 2157 5' 3 (1.6 m)     Head Circumference --      Peak Flow --      Pain Score 02/24/24 2157 0     Pain Loc --      Pain Education --      Exclude from Growth Chart --     Most recent vital signs: Vitals:   02/24/24 2210  BP: 109/64  Pulse: 79  Resp: 19  Temp: 98.1 F (36.7 C)  SpO2: 100%    Physical Exam Constitutional:      Appearance: He is well-developed.     Comments: Slurring his words  HENT:     Head: Atraumatic.  Eyes:     Conjunctiva/sclera: Conjunctivae normal.  Cardiovascular:     Rate and Rhythm: Regular rhythm.  Pulmonary:     Effort: No respiratory distress.  Musculoskeletal:     Cervical back: Normal range of motion.  Skin:    General: Skin is warm.  Neurological:     Mental Status: He is alert. Mental status is at baseline.  Psychiatric:        Speech: Speech is slurred.        Behavior: Behavior is cooperative.         Thought Content: Thought content includes suicidal ideation. Thought content does not include homicidal ideation. Thought content does not include homicidal or suicidal plan.        Cognition and Memory: Cognition normal.        Judgment: Judgment is impulsive.     IMPRESSION / MDM / ASSESSMENT AND PLAN / ED COURSE  I reviewed the triage vital signs and the nursing notes.  Differential diagnosis including suicidal ideation, intoxication, electrolyte abnormality, dehydration\    LABS (all labs ordered are listed, but only abnormal results are displayed) Labs interpreted as -    Labs Reviewed  COMPREHENSIVE METABOLIC PANEL WITH GFR - Abnormal; Notable for the following components:      Result Value   Potassium 3.4 (*)    Glucose, Bld 139 (*)    Calcium 8.3 (*)    Total Protein 6.1 (*)    All other  components within normal limits  ETHANOL - Abnormal; Notable for the following components:   Alcohol, Ethyl (B) 293 (*)    All other components within normal limits  ACETAMINOPHEN  LEVEL - Abnormal; Notable for the following components:   Acetaminophen  (Tylenol ), Serum <10 (*)    All other components within normal limits  SALICYLATE LEVEL - Abnormal; Notable for the following components:   Salicylate Lvl <7.0 (*)    All other components within normal limits  CBC  URINE DRUG SCREEN    MDM  Patient's alcohol level is elevated at 293.  No significant electrolyte abnormalities.  No significant leukocytosis.  UDS pending.  Consulted psychiatry for further evaluation for suicidal ideation.     The patient has been placed in psychiatric observation due to the need to provide a safe environment for the patient while obtaining psychiatric consultation and evaluation, as well as ongoing medical and medication management to treat the patient's condition.  The patient has not been placed under full IVC at this time.   PROCEDURES:  Critical Care performed:  No  Procedures  Patient's presentation is most consistent with acute presentation with potential threat to life or bodily function.   MEDICATIONS ORDERED IN ED: Medications - No data to display  FINAL CLINICAL IMPRESSION(S) / ED DIAGNOSES   Final diagnoses:  Suicidal ideation  Alcoholic intoxication without complication     Rx / DC Orders   ED Discharge Orders     None        Note:  This document was prepared using Dragon voice recognition software and may include unintentional dictation errors.   Suzanne Kirsch, MD 02/24/24 2324

## 2024-02-25 ENCOUNTER — Inpatient Hospital Stay
Admission: AD | Admit: 2024-02-25 | Discharge: 2024-02-29 | DRG: 897 | Disposition: A | Discharge status: Home / Self Care | Source: Intra-hospital | Attending: Psychiatry | Admitting: Psychiatry

## 2024-02-25 ENCOUNTER — Encounter: Payer: Self-pay | Admitting: Psychiatry

## 2024-02-25 DIAGNOSIS — Z79899 Other long term (current) drug therapy: Secondary | ICD-10-CM

## 2024-02-25 DIAGNOSIS — F17210 Nicotine dependence, cigarettes, uncomplicated: Secondary | ICD-10-CM | POA: Diagnosis present

## 2024-02-25 DIAGNOSIS — F32A Depression, unspecified: Secondary | ICD-10-CM | POA: Diagnosis present

## 2024-02-25 DIAGNOSIS — Z560 Unemployment, unspecified: Secondary | ICD-10-CM

## 2024-02-25 DIAGNOSIS — F1414 Cocaine abuse with cocaine-induced mood disorder: Secondary | ICD-10-CM | POA: Diagnosis present

## 2024-02-25 DIAGNOSIS — Y908 Blood alcohol level of 240 mg/100 ml or more: Secondary | ICD-10-CM | POA: Diagnosis present

## 2024-02-25 DIAGNOSIS — Z5941 Food insecurity: Secondary | ICD-10-CM | POA: Diagnosis not present

## 2024-02-25 DIAGNOSIS — F1410 Cocaine abuse, uncomplicated: Secondary | ICD-10-CM

## 2024-02-25 DIAGNOSIS — Z9151 Personal history of suicidal behavior: Secondary | ICD-10-CM | POA: Diagnosis not present

## 2024-02-25 DIAGNOSIS — R45851 Suicidal ideations: Secondary | ICD-10-CM | POA: Diagnosis present

## 2024-02-25 DIAGNOSIS — F10129 Alcohol abuse with intoxication, unspecified: Secondary | ICD-10-CM

## 2024-02-25 DIAGNOSIS — F10120 Alcohol abuse with intoxication, uncomplicated: Secondary | ICD-10-CM | POA: Diagnosis present

## 2024-02-25 DIAGNOSIS — F1994 Other psychoactive substance use, unspecified with psychoactive substance-induced mood disorder: Principal | ICD-10-CM | POA: Diagnosis present

## 2024-02-25 DIAGNOSIS — F39 Unspecified mood [affective] disorder: Secondary | ICD-10-CM

## 2024-02-25 DIAGNOSIS — F17290 Nicotine dependence, other tobacco product, uncomplicated: Secondary | ICD-10-CM | POA: Diagnosis present

## 2024-02-25 DIAGNOSIS — F10920 Alcohol use, unspecified with intoxication, uncomplicated: Secondary | ICD-10-CM

## 2024-02-25 DIAGNOSIS — F1490 Cocaine use, unspecified, uncomplicated: Secondary | ICD-10-CM

## 2024-02-25 LAB — RESP PANEL BY RT-PCR (RSV, FLU A&B, COVID)  RVPGX2
Influenza A by PCR: NEGATIVE
Influenza B by PCR: NEGATIVE
Resp Syncytial Virus by PCR: NEGATIVE
SARS Coronavirus 2 by RT PCR: NEGATIVE

## 2024-02-25 MED ORDER — HYDROXYZINE HCL 50 MG PO TABS
50.0000 mg | ORAL_TABLET | Freq: Four times a day (QID) | ORAL | Status: DC | PRN
Start: 2024-02-25 — End: 2024-02-29
  Administered 2024-02-28: 50 mg via ORAL
  Filled 2024-02-25: qty 1

## 2024-02-25 MED ORDER — ARIPIPRAZOLE 10 MG PO TABS
20.0000 mg | ORAL_TABLET | Freq: Every day | ORAL | Status: DC
  Administered 2024-02-25: 20 mg via ORAL
  Filled 2024-02-25: qty 2

## 2024-02-25 MED ORDER — THIAMINE HCL 100 MG/ML IJ SOLN: 100.0000 mg | Freq: Every day | INTRAMUSCULAR | Status: DC

## 2024-02-25 MED ORDER — LORAZEPAM 2 MG/ML IJ SOLN: 2.0000 mg | Freq: Three times a day (TID) | INTRAMUSCULAR | Status: DC | PRN

## 2024-02-25 MED ORDER — ZIPRASIDONE MESYLATE 20 MG IM SOLR: 10.0000 mg | Freq: Four times a day (QID) | INTRAMUSCULAR | Status: DC | PRN

## 2024-02-25 MED ORDER — ADULT MULTIVITAMIN W/MINERALS CH
1.0000 | ORAL_TABLET | Freq: Every day | ORAL | Status: DC
  Administered 2024-02-25: 1 via ORAL
  Filled 2024-02-25: qty 1

## 2024-02-25 MED ORDER — MAGNESIUM HYDROXIDE 400 MG/5ML PO SUSP: 30.0000 mL | Freq: Every day | ORAL | Status: DC | PRN

## 2024-02-25 MED ORDER — DIPHENHYDRAMINE HCL 50 MG/ML IJ SOLN: 50.0000 mg | Freq: Three times a day (TID) | INTRAMUSCULAR | Status: DC | PRN

## 2024-02-25 MED ORDER — NICOTINE 21 MG/24HR TD PT24
21.0000 mg | MEDICATED_PATCH | Freq: Every day | TRANSDERMAL | Status: DC
  Administered 2024-02-26: 21 mg via TRANSDERMAL
  Filled 2024-02-25 (×4): qty 1

## 2024-02-25 MED ORDER — FLUOXETINE HCL 20 MG PO CAPS
40.0000 mg | ORAL_CAPSULE | Freq: Every day | ORAL | Status: DC
  Administered 2024-02-25: 40 mg via ORAL
  Filled 2024-02-25: qty 2

## 2024-02-25 MED ORDER — ARIPIPRAZOLE 10 MG PO TABS
20.0000 mg | ORAL_TABLET | Freq: Every day | ORAL | Status: DC
  Administered 2024-02-26: 20 mg via ORAL
  Filled 2024-02-25: qty 2

## 2024-02-25 MED ORDER — LORAZEPAM 1 MG PO TABS
1.0000 mg | ORAL_TABLET | ORAL | Status: DC | PRN
  Administered 2024-02-25: 1 mg via ORAL
  Filled 2024-02-25: qty 1

## 2024-02-25 MED ORDER — LORAZEPAM 2 MG/ML IJ SOLN: 1.0000 mg | INTRAMUSCULAR | Status: DC | PRN

## 2024-02-25 MED ORDER — ACETAMINOPHEN 325 MG PO TABS: 650.0000 mg | ORAL_TABLET | Freq: Four times a day (QID) | ORAL | Status: DC | PRN

## 2024-02-25 MED ORDER — THIAMINE MONONITRATE 100 MG PO TABS
100.0000 mg | ORAL_TABLET | Freq: Every day | ORAL | Status: DC
  Administered 2024-02-25: 100 mg via ORAL
  Filled 2024-02-25: qty 1

## 2024-02-25 MED ORDER — FOLIC ACID 1 MG PO TABS
1.0000 mg | ORAL_TABLET | Freq: Every day | ORAL | Status: DC
  Administered 2024-02-25: 1 mg via ORAL
  Filled 2024-02-25: qty 1

## 2024-02-25 MED ORDER — HYDROXYZINE HCL 25 MG PO TABS
50.0000 mg | ORAL_TABLET | Freq: Four times a day (QID) | ORAL | Status: DC | PRN
Start: 2024-02-25 — End: 2024-02-25

## 2024-02-25 MED ORDER — ALUM & MAG HYDROXIDE-SIMETH 200-200-20 MG/5ML PO SUSP: 30.0000 mL | ORAL | Status: DC | PRN

## 2024-02-25 MED ORDER — ONDANSETRON 4 MG PO TBDP
4.0000 mg | ORAL_TABLET | Freq: Once | ORAL | Status: AC
  Administered 2024-02-25: 4 mg via ORAL

## 2024-02-25 MED ORDER — HALOPERIDOL LACTATE 5 MG/ML IJ SOLN: 5.0000 mg | Freq: Three times a day (TID) | INTRAMUSCULAR | Status: DC | PRN

## 2024-02-25 MED ORDER — NICOTINE POLACRILEX 2 MG MT GUM
2.0000 mg | CHEWING_GUM | OROMUCOSAL | Status: DC | PRN
  Administered 2024-02-26: 2 mg via ORAL
  Filled 2024-02-25: qty 1

## 2024-02-25 MED ORDER — FLUOXETINE HCL 20 MG PO CAPS
40.0000 mg | ORAL_CAPSULE | Freq: Every day | ORAL | Status: DC
  Administered 2024-02-26: 40 mg via ORAL
  Filled 2024-02-25: qty 2

## 2024-02-25 MED ORDER — HALOPERIDOL LACTATE 5 MG/ML IJ SOLN: 10.0000 mg | Freq: Three times a day (TID) | INTRAMUSCULAR | Status: DC | PRN

## 2024-02-25 MED ORDER — FOLIC ACID 1 MG PO TABS
1.0000 mg | ORAL_TABLET | Freq: Every day | ORAL | Status: DC
  Administered 2024-02-26 – 2024-02-27 (×2): 1 mg via ORAL
  Filled 2024-02-25 (×4): qty 1

## 2024-02-25 MED ORDER — DIAZEPAM 5 MG/ML IJ SOLN: 10.0000 mg | Freq: Four times a day (QID) | INTRAMUSCULAR | Status: DC | PRN

## 2024-02-25 NOTE — ED Notes (Signed)
 Pt asking for soda to drink. Pt offered water at this time which pt accepts.

## 2024-02-25 NOTE — ED Notes (Signed)
 Pt informed of in-pt bed assigned, pt stating he no longer wants to stay for treatment as it doesn't help.

## 2024-02-25 NOTE — ED Notes (Signed)
 Pt offered breakfast and he stated he did want breakfast.

## 2024-02-25 NOTE — ED Notes (Signed)
 Pt ambulated independently to BR at this time. Pt back in bed with no issue.

## 2024-02-25 NOTE — ED Notes (Signed)
 Pt asking if he can leave. Pt informed psych is recommending in-patient treatment. Pt stating yea no, I don't want to do that. Pt informed would let the psych team know and to give a little time for response.

## 2024-02-25 NOTE — Group Note (Signed)
 Date:  02/25/2024 Time:  8:31 PM    Additional Comments:  Did not attend group.  Butler LITTIE Gelineau 02/25/2024, 8:31 PM

## 2024-02-25 NOTE — Plan of Care (Signed)
   Problem: Education: Goal: Knowledge of [facility] Education information/materials will improve Outcome: Progressing Goal: Emotional status will improve Outcome: Progressing Goal: Mental status will improve Outcome: Progressing Goal: Verbalization of understanding the information provided will improve Outcome: Progressing

## 2024-02-25 NOTE — Group Note (Signed)
 [facility] LCSW Group Therapy Note   Group Date: 02/25/2024 Start Time: 1330 End Time: 1430   Type of Therapy/Topic:  Group Therapy:  Emotion Regulation  Participation Level:  Did Not Attend   Mood:  Description of Group:    The purpose of this group is to assist patients in learning to regulate negative emotions and experience positive emotions. Patients will be guided to discuss ways in which they have been vulnerable to their negative emotions. These vulnerabilities will be juxtaposed with experiences of positive emotions or situations, and patients challenged to use positive emotions to combat negative ones. Special emphasis will be placed on coping with negative emotions in conflict situations, and patients will process healthy conflict resolution skills.  Therapeutic Goals: Patient will identify two positive emotions or experiences to reflect on in order to balance out negative emotions:  Patient will label two or more emotions that they find the most difficult to experience:  Patient will be able to demonstrate positive conflict resolution skills through discussion or role plays:   Summary of Patient Progress:   Patient did not attend group    Therapeutic Modalities:   Cognitive Behavioral Therapy Feelings Identification Dialectical Behavioral Therapy   Aldo CHRISTELLA Niece, LCSW

## 2024-02-25 NOTE — Progress Notes (Signed)
   02/25/24 1045  Psych Admission Type (Psych Patients Only)  Admission Status Voluntary  Psychosocial Assessment  Patient Complaints Irritability;Substance abuse  Eye Contact Fair  Facial Expression Animated  Affect Irritable;Angry  Speech Soft  Interaction Minimal  Motor Activity Slow  Appearance/Hygiene In scrubs  Behavior Characteristics Irritable  Mood Irritable  Thought Process  Coherency WDL  Content WDL  Delusions None reported or observed  Perception WDL  Hallucination None reported or observed  Judgment Impaired  Confusion WDL  Danger to Self  Current suicidal ideation? Passive  Self-Injurious Behavior No self-injurious ideation or behavior indicators observed or expressed   Agreement Not to Harm Self Yes  Description of Agreement verbal  Danger to Others  Danger to Others None reported or observed   Duvall presents to [facility] stating he feels suicidal due to "a lot of reasons." He reports nausea and recent vomiting and describes feeling dizzy and like "the room is spinning." He denies any pain aside from nausea. When encouraged to elaborate, he continues to endorse SI but denies intent or plan.  He denies auditory or visual hallucinations and homicidal ideations. He rates his anxiety as 5/10 and his depression as 8/10.

## 2024-02-25 NOTE — ED Notes (Signed)
 Pt stating he is going to vomit. Pt provided with emesis bag. Pt then began to vomit, 3 episodes small amount in total. PO Zofran  to be given.

## 2024-02-25 NOTE — ED Notes (Signed)
 Pt back to his bed from interview. Pt given sandwich tray and water.

## 2024-02-25 NOTE — BH Assessment (Addendum)
 Comprehensive Clinical Assessment (CCA) Screening, Triage and Referral Note  02/25/2024 Edward Shelton [id number] Recommendations for Services/Supports/Treatments: [facility] consult/Disposition pending. Edward Shelton, English speaking, Hispanic male. Pt presented to [facility] ED voluntary. Per triage note: Patient stumbled into ER, knocked over metal detector, placed into wheelchair via this RN. Patient appears to be under the influence of drugs/alcohol. Endorses drinking and dropping acid because he wants to die. Seen previously 11/14 for similar.  On assessment, pt. presented with slurred speech. Pt was silly and laughing inappropriately. Pt was initially adamant about loving ketamine as the assessment progressed pt began admitting the negative aspects of his substance abuse.  When asked what brought pt. to the ED the pt. admitted to recently resorting to substance use stating, "I left court early; I'm cooked". Pt explained that he resorted to getting high after feeling hopeless after leaving court earlier in the day. Pt identified feelings of sadness, not being satisfied with his life, and feelings of worthlessness as catalysts for his use. Pt positive for cocaine; BAL 293. Pt admitted to ketamine and alcohol use prior to arrival, reporting that he'd last used earlier in the day. Pt described his substance abuse as problematic explaining that his use is impacting his mental health; however, pt remains ambiguous about seeking help. Pt had poor insight and impaired judgment. Pt had an incongruent affect, and a euphoric mood. Pt endorsed having passive SI. Pt denied HI or AV/H. Pt would intermittently express a desire for substance abuse longer term treatment. Pt reported that he uses drugs because his ego gets in the way and he believes that he's intelligent enough to use without issue. 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  MDD, recurrent, mild   Patient Reported Information How did you hear about us ? Self  What Is the Reason for Your Visit/Call Today? Patient stumbled into ER, knocked over metal detector, placed into wheelchair via this RN. Patient appears to be under the influence of drugs/alcohol. Endorses drinking and dropping acid because he wants to die.  Seen previously 11/14 for similar.  How Long Has This Been Causing You Problems? > than 6 months  What Do You Feel Would Help You the Most Today? Alcohol or Drug Use Treatment; Treatment for Depression or other mood problem   Have You Recently Had Any Thoughts About Hurting Yourself? Yes  Are You Planning to Commit Suicide/Harm Yourself At This time? No   Have you Recently Had Thoughts About Hurting Someone Edward Shelton? No  Are You Planning to Harm Someone at This Time? No  Explanation: Pt admits to having chronic SI without a plan.   Have You Used Any Alcohol or Drugs in the Past 24 Hours? Yes  How Long Ago Did You Use Drugs or Alcohol? 02/25/24 prior to arrival and pt is under the influence.  What Did You Use and How Much? Ketamine and an unknown amount of alcohol   Do You Currently Have a Therapist/Psychiatrist? No  Name of Therapist/Psychiatrist: No data recorded  Have You Been Recently Discharged From Any Office Practice or Programs? No  Explanation of Discharge From Practice/Program: No data recorded   CCA Screening Triage Referral Assessment Type of Contact: Face-to-Face  Telemedicine Service Delivery:   Is this Initial or Reassessment?   Date Telepsych consult ordered in [facility]:    Time Telepsych consult ordered in [facility]:  Location of Assessment: [facility] ED  Provider Location: [facility] ED    Collateral Involvement: None provided   Does Patient Have a Court Appointed Legal Guardian? No data recorded Name and Contact of Legal Guardian: No data recorded If Minor and Not Living  with Parent(s), Who has Custody? n/a  Is CPS involved or ever been involved? Never  Is APS involved or ever been involved? Never   Patient Determined To Be At Risk for Harm To Self or Others Based on Review of Patient Reported Information or Presenting Complaint? Yes, for Self-Harm  Method: No Plan  Availability of Means: No access or NA  Intent: Vague intent or NA  Notification Required: No need or identified person  Additional Information for Danger to Others Potential: -- (n/a)  Additional Comments for Danger to Others Potential: n/a  Are There Guns or Other Weapons in Your Home? No  Types of Guns/Weapons: n/a  Are These Weapons Safely Secured?                            No  Who Could Verify You Are Able To Have These Secured: n/a  Do You Have any Outstanding Charges, Pending Court Dates, Parole/Probation? Pt reported walking out of court earlier 02/24/24  Contacted To Inform of Risk of Harm To Self or Others: -- (n/a)   Does Patient Present under Involuntary Commitment? No    [place] of Residence: [place]   Patient Currently Receiving the Following Services: Not Receiving Services   Determination of Need: Urgent (48 hours)   Options For Referral: Facility-Based Crisis; Chemical Dependency Intensive Outpatient Therapy (CDIOP); Medication Management; Intensive Outpatient Therapy; [facility]   Disposition Recommendation per psychiatric provider: Pending psych consult.  Edward Shelton Aileen Amore, LCAS

## 2024-02-25 NOTE — ED Provider Notes (Signed)
 23 year old male who was brought in due to concern of suicidal ideations.  Overnight no acute events however during my shift patient requesting to leave.  I reviewed his note, he did appear to be intoxicated initial presentation, but multiple risk factors such as many new stressors as well as living by himself, requested reevaluation by psychiatry team determine if appropriate for continued voluntary placement but more likely would warrant IVC if patient revoked voluntary status.  Patient amenable for for voluntary admission after discussion.   Fernand Rossie HERO, MD 02/25/24 [phone]

## 2024-02-25 NOTE — ED Notes (Signed)
 Pt asleep and unable to safely ambulate at this time. Will dress pt out in hospital scrubs as soon as pt is able to wake up and safely do so. Pt is positioned in the hall across from nursing station for safe observation. Bed alarm is active.

## 2024-02-25 NOTE — ED Notes (Signed)
 Pt recommended for inpatient care.

## 2024-02-25 NOTE — ED Provider Notes (Signed)
 Emergency Medicine Observation Re-evaluation Note   BP 109/64 (BP Location: Right Arm)   Pulse 79   Temp 98.1 F (36.7 C) (Oral)   Resp 19   Ht 5' 3 (1.6 m)   Wt 59 kg   SpO2 100%   BMI 23.04 kg/m    ED Course / MDM   No reported events during my shift at the time of this note.   Psychiatry recommends inpatient voluntary admission.  Ginnie Shams MD    Shams Ginnie, MD 02/25/24 [phone]

## 2024-02-25 NOTE — Consult Note (Addendum)
 [facility] Consult Note  Patient Name: Edward Shelton MRN: [id number] DOB: September 26, 2000 DATE OF Consult: 02/25/2024  PRIMARY PSYCHIATRIC DIAGNOSES   1.  Mood Disorder 2.  Cocaine Use Disorder 3.  Acute Alcohol Intoxication 4.  Suicidal Ideation   RECOMMENDATIONS  Recommendations: Medication recommendations: Start back on previous meds:  Abilify , 20 mg every day for mood control; Prozac , 40 mg every day for depression/anxiety.  PRN's:  hydroxyzine  50 mg q6h PRN; For severe agitation/aggression:  Geodon , 10 mg IM q6h PRN and Valium , 10 mg IM q6h PRN Non-Medication/therapeutic recommendations: Patient continues with suicidal ideation, so continue with close observation, as per ED protocol, until can be safely admitted to Psychiatry. Continue with matter-of-fact emotional support in Ed, pending transfer Is inpatient psychiatric hospitalization recommended for this patient? Yes (Explain why): Patient again presents with suicidal ideation, likely secondary both to underlying mood disorder and cocaine abuse, with history of prior attempts.  Meets criteria for admission, and patient willing to come in voluntarily. Is another care setting recommended for this patient? (examples may include Crisis Stabilization Unit, Residential/Recovery Treatment, ALF/SNF, Memory Care Unit)  No (Explain why): As above From a psychiatric perspective, is this patient appropriate for discharge to an outpatient setting/resource or other less restrictive environment for continued care?  No (Explain why): As above Follow-Up Telepsychiatry C/L services: We will sign off for now. Please re-consult our service if needed for any concerning changes in the patient's condition, discharge planning, or questions. Communication: Treatment team members (and family members if applicable) who were involved in treatment/care discussions and planning, and with whom we spoke or engaged with via secure text/chat, include the  following: Secure message sent to Dr. Cyrena, ED attending, and ED, outlining above recommendations  Thank you for involving us  in the care of this patient. If you have any additional questions or concerns, please call [phone] and ask for the provider on-call.   TELEPSYCHIATRY ATTESTATION & CONSENT   As the provider for this telehealth consult, I attest that I verified the patient's identity using two separate identifiers, introduced myself to the patient, provided my credentials, disclosed my location, and performed this encounter via a HIPAA-compliant, real-time, face-to-face, two-way, interactive audio and video platform and with the full consent and agreement of the patient (or guardian as applicable.)   Patient physical location: ED, [facility]. Telehealth provider physical location: home office in state of [place] .  Video start time: 0445h EST  Video end time: 0500h EST   Total time spent in this encounter was 30 minutes, including record review, clinical interview, behavior observations, discussion of impressions and recommendations (including medications and hospitalization), and consultation/communication with relevant parties   IDENTIFYING DATA  Edward Shelton is a 23 y.o. year-old male for whom a psychiatric consultation has been ordered by the primary provider. The patient was identified using two separate identifiers.  CHIEF COMPLAINT/REASON FOR CONSULT   I can't stay off the drugs and stay on my medicines   HISTORY OF PRESENT ILLNESS (HPI)  The patient presents with longstanding mood disorder and polysubstance use, most often stimulants, although reports that also has been abusing ketamine.  Several ED visits for suicidal thoughts, but usually decides to leave before getting treatment.  Patient again presents with similar picture.  Told staff that he had left court today, and he is worried about the consequences he will be facing.  Did again abuse  both cocaine and ketamine, and came into ED acutely intoxicated, with inappropriate affect.  Now much calmer and more withdrawn, with depressed mood and feelings of hopelessness.  Now feels that wants to kill himself to avoid potential consequences of his actions.  Denies psychotic sx's.  No homicidal ideation.  UDS positive for cocaine.  BAL:  293 on admission (6 hours ago).   PAST PSYCHIATRIC HISTORY  As above Otherwise as per HPI above.  PAST MEDICAL HISTORY  Past Medical History:  Diagnosis Date   Depression      HOME MEDICATIONS  Facility Ordered Medications  Medication   ARIPiprazole  (ABILIFY ) tablet 20 mg   FLUoxetine  (PROZAC ) capsule 40 mg   hydrOXYzine  (ATARAX ) tablet 50 mg   ziprasidone  (GEODON ) injection 10 mg   diazepam  (VALIUM ) injection 10 mg   PTA Medications  Medication Sig   traZODone  (DESYREL ) 50 MG tablet Take 50 mg by mouth at bedtime. (Patient not taking: Reported on 02/18/2024)   mirtazapine  (REMERON ) 15 MG tablet Take 15 mg by mouth at bedtime. (Patient not taking: Reported on 02/18/2024)   hydrOXYzine  (VISTARIL ) 25 MG capsule Take 25 mg by mouth every 6 (six) hours as needed for anxiety. (Patient not taking: Reported on 02/18/2024)   guanFACINE  (INTUNIV ) 2 MG TB24 ER tablet Take 2 mg by mouth daily. (Patient not taking: Reported on 02/18/2024)     ALLERGIES  No Known Allergies  SOCIAL & SUBSTANCE USE HISTORY  Social History   Socioeconomic History   Marital status: Single    Spouse name: Not on file   Number of children: Not on file   Years of education: Not on file   Highest education level: Not on file  Occupational History   Not on file  Tobacco Use   Smoking status: Every Day    Types: Cigarettes   Smokeless tobacco: Current  Vaping Use   Vaping status: Every Day  Substance and Sexual Activity   Alcohol use: Not Currently   Drug use: Yes    Types: Marijuana, IV    Comment: shrooms, MDMA, ketamine   Sexual activity: Yes  Other Topics  Concern   Not on file  Social History Narrative   Not on file   Social Drivers of Health   Financial Resource Strain: Not on file  Food Insecurity: Low Risk (12/20/2023)   Received from [facility]   Food Insecurity    Within the past 12 months, did the food you bought just not last and you didn't have money to get more?: No    Within the past 12 months, did you worry that your food would run out before you got money to buy more?: No  Transportation Needs: Low Risk (12/20/2023)   Received from [facility]   Transportation Needs    Within the past 12 months, has a lack of transportation kept you from medical appointments or from doing things needed for daily living?: No  Physical Activity: Not on file  Stress: Not on file  Social Connections: Not on file   Social History   Tobacco Use  Smoking Status Every Day   Types: Cigarettes  Smokeless Tobacco Current   Social History   Substance and Sexual Activity  Alcohol Use Not Currently   Social History   Substance and Sexual Activity  Drug Use Yes   Types: Marijuana, IV   Comment: shrooms, MDMA, ketamine    Additional pertinent information Lives in trailer owned by mother.  Unemployed. [employer]  FAMILY HISTORY  Family History  Problem Relation Age of Onset  Healthy Mother    Healthy Father    Family Psychiatric History (if known):  Did not report   MENTAL STATUS EXAM (MSE)  Mental Status Exam: General Appearance: Fairly Groomed  Orientation:  Full (Time, Place, and Person)  Memory:  Immediate;   Fair Recent;   Fair Remote;   Fair  Concentration:  Concentration: Poor and Attention Span: Poor  Recall:  Fair  Attention  Poor  Eye Contact:  Poor  Speech:  Slow  Language:  Fair  Volume:  Decreased  Mood: I'm not doing OK.  Affect:  Depressed and Flat  Thought Process:  Descriptions of Associations: Tangential  Thought Content:  Rumination and Tangential  Suicidal Thoughts:  Yes.  with  intent/plan  Homicidal Thoughts:  No  Judgement:  Poor  Insight:  Shallow  Psychomotor Activity:  Decreased  Akathisia:  No  Fund of Knowledge:  Fair    Assets:  Engineer, Maintenance Social Support  Cognition:  WNL  ADL's:  Intact  AIMS (if indicated):       VITALS  Blood pressure 109/64, pulse 79, temperature 98.1 F (36.7 C), temperature source Oral, resp. rate 19, height 5' 3 (1.6 m), weight 59 kg, SpO2 100%.  LABS  Admission on 02/24/2024  Component Date Value Ref Range Status   Sodium 02/24/2024 145  135 - 145 mmol/L Final   Potassium 02/24/2024 3.4 (L)  3.5 - 5.1 mmol/L Final   Chloride 02/24/2024 110  98 - 111 mmol/L Final   CO2 02/24/2024 22  22 - 32 mmol/L Final   Glucose, Bld 02/24/2024 139 (H)  70 - 99 mg/dL Final   Glucose reference range applies only to samples taken after fasting for at least 8 hours.   BUN 02/24/2024 7  6 - 20 mg/dL Final   Creatinine, Ser 02/24/2024 0.77  0.61 - 1.24 mg/dL Final   Calcium 88/78/7974 8.3 (L)  8.9 - 10.3 mg/dL Final   Total Protein 88/78/7974 6.1 (L)  6.5 - 8.1 g/dL Final   Albumin 88/78/7974 4.0  3.5 - 5.0 g/dL Final   AST 88/78/7974 16  15 - 41 U/L Final   ALT 02/24/2024 12  0 - 44 U/L Final   Alkaline Phosphatase 02/24/2024 60  38 - 126 U/L Final   Total Bilirubin 02/24/2024 <0.2  0.0 - 1.2 mg/dL Final   GFR, Estimated 02/24/2024 >60  >60 mL/min Final   Comment: (NOTE) Calculated using the CKD-EPI Creatinine Equation (2021)    Anion gap 02/24/2024 13  5 - 15 Final   Performed at [facility], [address] [place], [place] [address]   Alcohol, Ethyl (B) 02/24/2024 293 (H)  <15 mg/dL Final   Comment: (NOTE) For medical purposes only. Performed at [facility], [address] [place], [place] [address]    WBC 02/24/2024 7.8  4.0 - 10.5 K/uL Final   RBC 02/24/2024 4.70  4.22 - 5.81 MIL/uL Final   Hemoglobin 02/24/2024 14.3  13.0 - 17.0 g/dL Final   HCT 88/78/7974 40.3  39.0 - 52.0 %  Final   MCV 02/24/2024 85.7  80.0 - 100.0 fL Final   MCH 02/24/2024 30.4  26.0 - 34.0 pg Final   MCHC 02/24/2024 35.5  30.0 - 36.0 g/dL Final   RDW 88/78/7974 13.9  11.5 - 15.5 % Final   Platelets 02/24/2024 210  150 - 400 K/uL Final   nRBC 02/24/2024 0.0  0.0 - 0.2 % Final   Performed at [facility], [address] [place], [place] [address]   Opiates 02/24/2024 NEGATIVE  NEGATIVE Final   Cocaine 02/24/2024 POSITIVE (A)  NEGATIVE Final   Benzodiazepines 02/24/2024 NEGATIVE  NEGATIVE Final   Amphetamines 02/24/2024 NEGATIVE  NEGATIVE Final   Tetrahydrocannabinol 02/24/2024 NEGATIVE  NEGATIVE Final   Barbiturates 02/24/2024 NEGATIVE  NEGATIVE Final   Methadone Scn, Ur 02/24/2024 NEGATIVE  NEGATIVE Final   Fentanyl  02/24/2024 NEGATIVE  NEGATIVE Final   Comment: (NOTE) Drug screen is for Medical Purposes only. Positive results are preliminary only. If confirmation is needed, notify lab within 5 days.  Drug Class                 Cutoff (ng/mL) Amphetamine and metabolites 1000 Barbiturate and metabolites 200 Benzodiazepine              200 Opiates and metabolites     300 Cocaine and metabolites     300 THC                         50 Fentanyl                     5 Methadone                   300  Trazodone  is metabolized in vivo to several metabolites,  including pharmacologically active m-CPP, which is excreted in the  urine.  Immunoassay screens for amphetamines and MDMA have potential  cross-reactivity with these compounds and may provide false positive  result.  Performed at [facility], [address] [place], [place] [address]    Acetaminophen  (Tylenol ), Serum 02/24/2024 <10 (L)  10 - 30 ug/mL Final   Comment: (NOTE) Toxic concentrations can be more effectively related to post dose interval; >200, >100, and >50 ug/mL serum concentrations correspond to toxic concentrations at 4, 8, and 12 hours post dose, respectively.  Performed at [facility], [address] [place], [place] [address]    Salicylate Lvl 02/24/2024 <7.0 (L)  7.0 - 30.0 mg/dL Final   Performed at [facility], [address] [place], [place] [address]    PSYCHIATRIC REVIEW OF SYSTEMS (ROS)  ROS: Notable for the following relevant positive findings: Review of Systems  Constitutional: Negative.   HENT: Negative.    Eyes: Negative.   Respiratory: Negative.    Cardiovascular: Negative.   Gastrointestinal: Negative.   Genitourinary: Negative.   Musculoskeletal: Negative.   Skin: Negative.   Neurological: Negative.   Endo/Heme/Allergies: Negative.   Psychiatric/Behavioral:  Positive for depression, substance abuse and suicidal ideas. The patient is nervous/anxious.     Additional findings:      Musculoskeletal: No abnormal movements observed      Gait & Station: Normal      Pain Screening: Denies      Nutrition & Dental Concerns:  Reviewed   RISK FORMULATION/ASSESSMENT      Is the patient experiencing any suicidal or homicidal ideations: Yes       Explain if yes: Patient continues to present with mood lability and suicidal ideation  Protective factors considered for safety management:   Patient with limited community resources  Risk factors/concerns considered for safety management:   Prior attempt Depression Substance abuse/dependence Access to lethal means Hopelessness Impulsivity Isolation Barriers to accessing treatment Male gender Unmarried  Is there a safety management plan with the patient and treatment team to minimize risk factors and promote protective factors:  Yes           Explain:  As above, limited local resources  Is crisis care placement or psychiatric hospitalization recommended: Yes     Based on my current evaluation and risk assessment, patient is determined at this time to be at:  High risk  *RISK ASSESSMENT Risk assessment is a dynamic process; it is possible that this patient's condition,  and risk level, may change. This should be re-evaluated and managed over time as appropriate. Please re-consult psychiatric consult services if additional assistance is needed in terms of risk assessment and management. If your team decides to discharge this patient, please advise the patient how to best access emergency psychiatric services, or to call 911, if their condition worsens or they feel unsafe in any way.   Adriana JINNY Pontes, MD Telepsychiatry Consult Services

## 2024-02-25 NOTE — ED Notes (Signed)
 PT in interview room for psych consult.

## 2024-02-25 NOTE — [facility] Counselor (Signed)
 Adult Comprehensive Assessment  Patient ID: Edward Shelton, male   DOB: [birth year], 23 y.o.   MRN: [id number]  The LCSWA attempted to complete the PSA with the patient. The patient was asleep and asked if the LCSWA would come back later. The LCSWA will make another attempt at a later time.     Roselyn GORMAN Lento. 02/25/2024

## 2024-02-25 NOTE — ED Notes (Signed)
 Pt refused EKG, stated he was not tryna do all that

## 2024-02-25 NOTE — ED Notes (Addendum)
 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

## 2024-02-26 DIAGNOSIS — F1994 Other psychoactive substance use, unspecified with psychoactive substance-induced mood disorder: Secondary | ICD-10-CM

## 2024-02-26 LAB — GLUCOSE, CAPILLARY: Glucose-Capillary: 104 mg/dL — ABNORMAL HIGH (ref 70–99)

## 2024-02-26 MED ORDER — ARIPIPRAZOLE 10 MG PO TABS
10.0000 mg | ORAL_TABLET | Freq: Every day | ORAL | Status: DC
  Administered 2024-02-27: 10 mg via ORAL
  Filled 2024-02-26 (×3): qty 1

## 2024-02-26 MED ORDER — FLUOXETINE HCL 20 MG PO CAPS
20.0000 mg | ORAL_CAPSULE | Freq: Every day | ORAL | Status: DC
  Administered 2024-02-27: 20 mg via ORAL
  Filled 2024-02-26 (×3): qty 1

## 2024-02-26 NOTE — H&P (Signed)
 Psychiatric Admission Assessment Adult  Patient Identification: Edward Shelton MRN:  [id number] Date of Evaluation:  02/26/2024 Chief Complaint:  Substance induced mood disorder (HCC) [F19.94]   History of Present Illness:   The patient is a 23 year old male who presented voluntarily for psychiatric evaluation secondary to longstanding depressed mood. He reports persistent sadness and hopelessness for "years," low energy (occasionally "medium"), but normal sleep and appetite. He discontinued his psychiatric medications "months" ago while using illicit substances and only resumed them yesterday. Medications restarted yesterday by the inpatient team (Abilify  20 mg, Prozac  40 mg) were too high after a prolonged hiatus; doses were therefore reduced to Abilify  10 mg daily and Prozac  20 mg daily to minimize side-effects (e.g., headache, GI upset). He endorses passive suicidal thoughts ("just want to die") without plan, intent, or access to firearms. He denies hallucinations except when intoxicated. Current substance use includes cocaine (~4/week), alcohol (~4/week), and intermittent ketamine; "not really" using marijuana. Patient reports prior heavy MDMA Kenn") use at age 74 and feels he "never fully recovered," which he connects to his persistent sadness and ongoing psychiatric symptoms. Last urine drug screen on admission positive only for cocaine; EtOH level 293; prior UDS a few days earlier positive for methamphetamine (patient attributes to second-hand exposure). The patient resides in [place] in a trailer owned by his mother (he rents from her). He is currently unemployed, citing lack of motivation. 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, [place] Relationship Status: Never married, no children, no significant other Legal: Facing charges for possession of ketamine and cocaine Spiritual: No affiliation reported Access to weapons: Denies  Access to weapons/lethal means: denies  Substance History Lives in [place] in [place] trailer; single; not working. Substance use: cocaine ~4/week, alcohol ~4/week ("poxies," "buzz buzz," "gases"); intermittent ketamine; denies regular marijuana use; no firearm access. History of alcohol withdrawal seizures none History of DT's none   Columbia Scale:  Flowsheet Row Admission (Current) from 02/25/2024 in [facility] INPATIENT [facility]AL MEDICINE ED from 02/24/2024 in [facility] Emergency Department at [facility] ED from 02/17/2024 in [facility] Emergency Department at [facility]  C-SSRS RISK CATEGORY Error: Q7 should not be populated when Q6 is No High Risk High Risk     Past Medical History:  Past Medical History:  Diagnosis Date   Depression    History reviewed. No pertinent surgical history. Family History:  Family History  Problem Relation Age of Onset   Healthy Mother    Healthy Father     Social History:  Social History   Substance and Sexual Activity  Alcohol Use Yes   Alcohol/week: 3.0 standard drinks of alcohol   Types: 3 Cans of beer per week     Social History   Substance and Sexual Activity  Drug Use Yes   Types: Marijuana, IV   Comment: shrooms, MDMA, ketamine      Allergies:  No Known Allergies Lab Results:  Results for orders placed  or  performed during the hospital encounter of 02/24/24 (from the past 48 hours)  Comprehensive metabolic panel     Status: Abnormal   Collection Time: 02/24/24 10:14 PM  Result Value Ref Range   Sodium 145 135 - 145 mmol/L   Potassium 3.4 (L) 3.5 - 5.1 mmol/L   Chloride 110 98 - 111 mmol/L   CO2 22 22 - 32 mmol/L   Glucose, Bld 139 (H) 70 - 99 mg/dL    Comment: Glucose reference range applies only to samples taken after fasting for at least 8 hours.   BUN 7 6 - 20 mg/dL   Creatinine, Ser 9.22 0.61 - 1.24 mg/dL   Calcium 8.3 (L) 8.9 - 10.3 mg/dL   Total Protein 6.1 (L) 6.5 - 8.1 g/dL   Albumin 4.0 3.5 - 5.0 g/dL   AST 16 15 - 41 U/L   ALT 12 0 - 44 U/L   Alkaline Phosphatase 60 38 - 126 U/L   Total Bilirubin <0.2 0.0 - 1.2 mg/dL   GFR, Estimated >39 >39 mL/min    Comment: (NOTE) Calculated using the CKD-EPI Creatinine Equation (2021)    Anion gap 13 5 - 15    Comment: Performed at [facility], [address] [place], [place] [address]  Ethanol     Status: Abnormal   Collection Time: 02/24/24 10:14 PM  Result Value Ref Range   Alcohol, Ethyl (B) 293 (H) <15 mg/dL    Comment: (NOTE) For medical purposes only. Performed at [facility], [address] [place], [place] [address]   cbc     Status: None   Collection Time: 02/24/24 10:14 PM  Result Value Ref Range   WBC 7.8 4.0 - 10.5 K/uL   RBC 4.70 4.22 - 5.81 MIL/uL   Hemoglobin 14.3 13.0 - 17.0 g/dL   HCT 59.6 60.9 - 47.9 %   MCV 85.7 80.0 - 100.0 fL   MCH 30.4 26.0 - 34.0 pg   MCHC 35.5 30.0 - 36.0 g/dL   RDW 86.0 88.4 - 84.4 %   Platelets 210 150 - 400 K/uL   nRBC 0.0 0.0 - 0.2 %    Comment: Performed at [facility], [address] [place], [place] [address]  Acetaminophen  level     Status: Abnormal   Collection Time: 02/24/24 10:14 PM  Result Value Ref Range   Acetaminophen  (Tylenol ), Serum <10 (L) 10 - 30 ug/mL    Comment: (NOTE) Toxic concentrations can be more effectively related  to post dose interval; >200, >100, and >50 ug/mL serum concentrations correspond to toxic concentrations at 4, 8, and 12 hours post dose, respectively.  Performed at [facility], [address] [place], [place] [address]   Salicylate level     Status: Abnormal   Collection Time: 02/24/24 10:14 PM  Result Value Ref Range   Salicylate Lvl <7.0 (L) 7.0 - 30.0 mg/dL    Comment: Performed at [facility], [address] [place], [place] [address]  Urine Drug Screen     Status: Abnormal   Collection Time: 02/24/24 11:20 PM  Result Value Ref Range   Opiates NEGATIVE NEGATIVE   Cocaine POSITIVE (A) NEGATIVE   Benzodiazepines NEGATIVE NEGATIVE   Amphetamines NEGATIVE NEGATIVE   Tetrahydrocannabinol NEGATIVE NEGATIVE   Barbiturates NEGATIVE NEGATIVE   Methadone Scn, Ur NEGATIVE NEGATIVE   Fentanyl  NEGATIVE NEGATIVE    Comment: (NOTE) Drug screen is for Medical Purposes only. Positive results are preliminary only. If  confirmation is needed, notify lab within 5 days.  Drug Class                 Cutoff (ng/mL) Amphetamine and metabolites 1000 Barbiturate and metabolites 200 Benzodiazepine              200 Opiates and metabolites     300 Cocaine and metabolites     300 THC                         50 Fentanyl                     5 Methadone                   300  Trazodone  is metabolized in vivo to several metabolites,  including pharmacologically active m-CPP, which is excreted in the  urine.  Immunoassay screens for amphetamines and MDMA have potential  cross-reactivity with these compounds and may provide false positive  result.  Performed at [facility], [address] [place], [place] [address]   Resp panel by RT-PCR (RSV, Flu A&B, Covid) Anterior Nasal Swab     Status: None   Collection Time: 02/25/24  8:43 AM   Specimen: Anterior Nasal Swab  Result Value Ref Range   SARS Coronavirus 2 by RT PCR NEGATIVE NEGATIVE    Comment:  (NOTE) SARS-CoV-2 target nucleic acids are NOT DETECTED.  The SARS-CoV-2 RNA is generally detectable in upper respiratory specimens during the acute phase of infection. The lowest concentration of SARS-CoV-2 viral copies this assay can detect is 138 copies/mL. A negative result does not preclude SARS-Cov-2 infection and should not be used as the sole basis for treatment or other patient management decisions. A negative result may occur with  improper specimen collection/handling, submission of specimen other than nasopharyngeal swab, presence of viral mutation(s) within the areas targeted by this assay, and inadequate number of viral copies(<138 copies/mL). A negative result must be combined with clinical observations, patient history, and epidemiological information. The expected result is Negative.  Fact Sheet for Patients:  bloggercourse.com  Fact Sheet for Healthcare Providers:  seriousbroker.it  This test is no t yet approved or cleared by the [place]  FDA and  has been authorized for detection and/or diagnosis of SARS-CoV-2 by FDA under an Emergency Use Authorization (EUA). This EUA will remain  in effect (meaning this test can be used) for the duration of the COVID-19 declaration under Section 564(b)(1) of the Act, 21 U.S.C.section 360bbb-3(b)(1), unless the authorization is terminated  or revoked sooner.       Influenza A by PCR NEGATIVE NEGATIVE   Influenza B by PCR NEGATIVE NEGATIVE    Comment: (NOTE) The Xpert Xpress SARS-CoV-2/FLU/RSV plus assay is intended as an aid in the diagnosis of influenza from Nasopharyngeal swab specimens and should not be used as a sole basis for treatment. Nasal washings and aspirates are unacceptable for Xpert Xpress SARS-CoV-2/FLU/RSV testing.  Fact Sheet for Patients: bloggercourse.com  Fact Sheet for Healthcare  Providers: seriousbroker.it  This test is not yet approved or cleared by the [place]  FDA and has been authorized for detection and/or diagnosis of SARS-CoV-2 by FDA under an Emergency Use Authorization (EUA). This EUA will remain in effect (meaning this test can be used) for the duration of the COVID-19 declaration under Section 564(b)(1) of the Act, 21 U.S.C. section 360bbb-3(b)(1), unless the authorization is terminated or revoked.  Resp Syncytial Virus by PCR NEGATIVE NEGATIVE    Comment: (NOTE) Fact Sheet for Patients: bloggercourse.com  Fact Sheet for Healthcare Providers: seriousbroker.it  This test is not yet approved or cleared by the [place]  FDA and has been authorized for detection and/or diagnosis of SARS-CoV-2 by FDA under an Emergency Use Authorization (EUA). This EUA will remain in effect (meaning this test can be used) for the duration of the COVID-19 declaration under Section 564(b)(1) of the Act, 21 U.S.C. section 360bbb-3(b)(1), unless the authorization is terminated or revoked.  Performed at [facility], [address] [place], [place] [address]     Blood Alcohol level:  Lab Results  Component Value Date   ETH 293 (H) 02/24/2024   ETH <15 02/17/2024    Metabolic Disorder Labs:  No results found for: HGBA1C, MPG No results found for: PROLACTIN No results found for: CHOL, TRIG, HDL, CHOLHDL, VLDL, LDLCALC  Current Medications: Current Facility-Administered Medications  Medication Dose Route Frequency Provider Last Rate Last Admin   acetaminophen  (TYLENOL ) tablet 650 mg  650 mg Oral Q6H PRN Hampton, Tracie B, NP       alum & mag hydroxide-simeth (MAALOX/MYLANTA) 200-200-20 MG/5ML suspension 30 mL  30 mL Oral Q4H PRN Hampton, Tracie B, NP       [START ON 02/27/2024] ARIPiprazole  (ABILIFY ) tablet 10 mg  10 mg Oral Daily Sonnet Rizor,  Emie Sommerfeld E, PA-C       haloperidol  lactate (HALDOL ) injection 5 mg  5 mg Intramuscular TID PRN Hampton, Tracie B, NP       And   diphenhydrAMINE  (BENADRYL ) injection 50 mg  50 mg Intramuscular TID PRN Hampton, Tracie B, NP       And   LORazepam  (ATIVAN ) injection 2 mg  2 mg Intramuscular TID PRN Hampton, Tracie B, NP       haloperidol  lactate (HALDOL ) injection 10 mg  10 mg Intramuscular TID PRN Hampton, Tracie B, NP       And   diphenhydrAMINE  (BENADRYL ) injection 50 mg  50 mg Intramuscular TID PRN Hampton, Tracie B, NP       And   LORazepam  (ATIVAN ) injection 2 mg  2 mg Intramuscular TID PRN Hampton, Tracie B, NP       [START ON 02/27/2024] FLUoxetine  (PROZAC ) capsule 20 mg  20 mg Oral Daily Shrihan Putt E, PA-C       folic acid  (FOLVITE ) tablet 1 mg  1 mg Oral Daily Hampton, Tracie B, NP   1 mg at 02/26/24 0916   hydrOXYzine  (ATARAX ) tablet 50 mg  50 mg Oral Q6H PRN Hampton, Tracie B, NP       magnesium  hydroxide (MILK OF MAGNESIA) suspension 30 mL  30 mL Oral Daily PRN Hampton, Tracie B, NP       nicotine  (NICODERM CQ  - dosed in mg/24 hours) patch 21 mg  21 mg Transdermal Q0600 Hampton, Tracie B, NP   21 mg at 02/26/24 9057   nicotine  polacrilex (NICORETTE ) gum 2 mg  2 mg Oral PRN Tramond Slinker E, PA-C   2 mg at 02/26/24 1459   PTA Medications: Medications Prior to Admission  Medication Sig Dispense Refill Last Dose/Taking   ARIPiprazole  (ABILIFY ) 20 MG tablet Take one tablet (20 mg dose) by mouth daily for 14 days. (Patient not taking: Reported on 02/18/2024)      FLUoxetine  (PROZAC ) 40 MG capsule Take one capsule (40 mg dose) by mouth daily for 14 days. (Patient not taking: Reported on 02/18/2024)  guanFACINE  (INTUNIV ) 2 MG TB24 ER tablet Take 2 mg by mouth daily. (Patient not taking: Reported on 02/18/2024)      hydrOXYzine  (VISTARIL ) 25 MG capsule Take 25 mg by mouth every 6 (six) hours as needed for anxiety. (Patient not taking: Reported on 02/18/2024)       mirtazapine  (REMERON ) 15 MG tablet Take 15 mg by mouth at bedtime. (Patient not taking: Reported on 02/18/2024)      traZODone  (DESYREL ) 50 MG tablet Take 50 mg by mouth at bedtime. (Patient not taking: Reported on 02/18/2024)       Psychiatric Specialty Exam:  Presentation  General Appearance:  Casual  Eye Contact: Fair  Speech: Clear and Coherent  Speech Volume: Normal    Mood and Affect  Mood: Anxious  Affect: Congruent   Thought Process  Thought Processes: Linear; Goal Directed  Descriptions of Associations:Intact  Orientation:Full (Time, Place and Person)  Thought Content:Logical  Hallucinations:Hallucinations: None  Ideas of Reference:None  Suicidal Thoughts:Suicidal Thoughts: Yes, Passive SI Passive Intent and/or Plan: Without Intent; Without Plan  Homicidal Thoughts:No data recorded  Sensorium  Memory: Immediate Good; Recent Good  Judgment: Poor  Insight: Shallow   Executive Functions  Concentration: Fair  Attention Span: Fair  Recall: Fair  Fund of Knowledge: Fair  Language: Fair   Psychomotor Activity  Psychomotor Activity:Psychomotor Activity: Normal   Assets  Assets: Manufacturing Systems Engineer; Financial Resources/Insurance; Housing; Social Support    Musculoskeletal: Strength & Muscle Tone: within normal limits Gait & Station: normal  Physical Exam: Physical Exam Vitals and nursing note reviewed.  HENT:     Head: Atraumatic.  Eyes:     Extraocular Movements: Extraocular movements intact.  Pulmonary:     Effort: Pulmonary effort is normal.  Neurological:     Mental Status: He is alert and oriented to person, place, and time.    ROS Psychiatric: Positive for persistent sadness, hopelessness, low energy, passive suicidal thoughts without plan/intent. Negative for current hallucinations, mania, or panic.  Sleep: Reports sleeping well, even when intoxicated.  GI: Denies current nausea or vomiting.  Appetite:  Normal.  Blood pressure (!) 128/93, pulse 85, temperature 98.7 F (37.1 C), resp. rate 18, height 5' 3 (1.6 m), weight 58.1 kg, SpO2 91%. Body mass index is 22.67 kg/m.  Principal Diagnosis: Substance induced mood disorder (HCC) Diagnosis:  Principal Problem:   Substance induced mood disorder (HCC)   Clinical Decision Making: Summary: 23 year old male with history of depressive disorder with psychotic features, polysubstance use, and passive SI presents voluntarily for stabilization. Restarted on Abilify  and Prozac  at lower doses; nicotine  replacement initiated. Labs notable for mild hypokalemia (K? 3.4), low calcium, mildly elevated glucose, EtOH 293, UDS positive for cocaine. Patient signed 72 hour form will observe. He wants to follow up with [facility]. Treatment Plan Summary:  Safety and Monitoring:             -- Voluntary admission to inpatient psychiatric unit for safety, stabilization and treatment             -- Daily contact with patient to assess and evaluate symptoms and progress in treatment             -- Patient's case to be discussed in multi-disciplinary team meeting             -- Observation Level: q15 minute checks             -- Vital signs:  q12 hours             --  Precautions: suicide, elopement, and assault   2. Psychiatric Diagnoses and Treatment:              Abilify  10 mg daily  Prozac  20 mg daily      -- The risks/benefits/side-effects/alternatives to this medication were discussed in detail with the patient and time was given for questions. The patient consents to medication trial.                -- Metabolic profile and EKG monitoring obtained while on an atypical antipsychotic (BMI: Lipid Panel: HbgA1c: QTc:)              -- Encouraged patient to participate in unit milieu and in scheduled group therapies                            3. Medical Issues Being Addressed:     no acute concerns 4. Discharge Planning:              -- Social work and case  management to assist with discharge planning and identification of hospital follow-up needs prior to discharge             -- Estimated LOS: 5-7 days             -- Discharge Concerns: Need to establish a safety plan; Medication compliance and effectiveness             -- Discharge Goals: Return home with outpatient referrals follow ups  Physician Treatment Plan for Primary Diagnosis: Substance induced mood disorder (HCC) Long Term Goal(s): Improvement in symptoms so as ready for discharge  Short Term Goals: Ability to identify changes in lifestyle to reduce recurrence of condition will improve, Ability to verbalize feelings will improve, Ability to disclose and discuss suicidal ideas, Ability to demonstrate self-control will improve, Ability to identify and develop effective coping behaviors will improve, Ability to maintain clinical measurements within normal limits will improve, Compliance with prescribed medications will improve, and Ability to identify triggers associated with substance abuse/mental health issues will improve  Physician Treatment Plan for Secondary Diagnosis: Principal Problem:   Substance induced mood disorder (HCC)  I certify that inpatient services furnished can reasonably be expected to improve the patient's condition.    Donnice FORBES Right, PA-C 11/23/20253:17 PM

## 2024-02-26 NOTE — Group Note (Signed)
 Date:  02/26/2024 Time:  9:37 PM  Group Topic/Focus:  Spirituality:   The focus of this group is to discuss how one's spirituality can aide in recovery.    Participation Level:  Did Not Attend   [name] HERO Dianna Ewald 02/26/2024, 9:37 PM

## 2024-02-26 NOTE — [facility] Counselor (Signed)
 Adult Comprehensive Assessment  Patient ID: Edward Shelton, male   DOB: [birth year], 23 y.o.   MRN: [id number]  Information Source: Information source: Patient  Current Stressors:  Patient states their primary concerns and needs for treatment are:: I was drunk. I don't remember what I said. Patient states their goals for this hospitilization and ongoing recovery are:: I don't know. Educational / Learning stressors: Patient denied. Employment / Job issues: Patient denied. Family Relationships: Patient denied. Financial / Lack of resources (include bankruptcy): Patient denied. Housing / Lack of housing: Patient denied. Physical health (include injuries & life threatening diseases): Patient denied. Social relationships: Patient denied. Substance abuse: Patient reported alcohol use 3 times a week. Bereavement / Loss: Patient denied.  Living/Environment/Situation:  Living Arrangements: Alone Living conditions (as described by patient or guardian): WNL Who else lives in the home?: Patient reported that he lives alone. How long has patient lived in current situation?: 8 months. What is atmosphere in current home: Comfortable  Family History:  Marital status: Single Are you sexually active?: Yes What is your sexual orientation?: Heterosexual Has your sexual activity been affected by drugs, alcohol, medication, or emotional stress?: Patient denied. Does patient have children?: No  Childhood History:  By whom was/is the patient raised?: Both parents Description of patient's relationship with caregiver when they were a child: Good. Patient's description of current relationship with people who raised him/her: Still good. How were you disciplined when you got in trouble as a child/adolescent?: Spanked. Does patient have siblings?: Yes Number of Siblings: 3 Description of patient's current relationship with siblings: I don't talk to them much. Did patient suffer any  verbal/emotional/physical/sexual abuse as a child?: No Did patient suffer from severe childhood neglect?: No Has patient ever been sexually abused/assaulted/raped as an adolescent or adult?: No Was the patient ever a victim of a crime or a disaster?: No Witnessed domestic violence?: No Has patient been affected by domestic violence as an adult?: No  Education:  Highest grade of school patient has completed: HS Diploma. Currently a student?: No Learning disability?: No  Employment/Work Situation:   Employment Situation: Unemployed Patient's Job has Been Impacted by Current Illness: No What is the Longest Time Patient has Held a Job?: 3-4 months. 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 Has Patient ever Been in the [employer]?: No  Financial Resources:   Financial resources: Medicaid Does patient have a representative payee or guardian?: No  Alcohol/Substance Abuse:   What has been your use of drugs/alcohol within the last 12 months?: Patient denied. If attempted suicide, did drugs/alcohol play a role in this?: No Alcohol/Substance Abuse Treatment Hx: Denies past history Has alcohol/substance abuse ever caused legal problems?: No  Social Support System:   Patient's Community Support System: Good Describe Community Support System: My mom. Type of faith/religion: Patient denied. How does patient's faith help to cope with current illness?: Unable to assess.  Leisure/Recreation:   Do You Have Hobbies?: No  Strengths/Needs:   What is the patient's perception of their strengths?: Patient denied. Patient states they can use these personal strengths during their treatment to contribute to their recovery: Patient denied. Patient states these barriers may affect/interfere with their treatment: None reported. Patient states these barriers may affect their return to the community: None reported. Other important information patient would like considered in  planning for their treatment: Patient does not want outpatient follow up.  Discharge Plan:   Currently receiving community mental health services: No Patient states  concerns and preferences for aftercare planning are: None reported. Patient states they will know when they are safe and ready for discharge when: I'm ready now. Does patient have access to transportation?: Yes Does patient have financial barriers related to discharge medications?: No Patient description of barriers related to discharge medications: None reported. Will patient be returning to same living situation after discharge?: Yes  Summary/Recommendations:   Summary and Recommendations (to be completed by the evaluator): Patient is a 23 year old male from [place], [place] [facility]) who presented to the ED voluntarily under the influence of drugs/alcohol according to chart. During assessment with this writer patient reported, I was drunk. I don't remember what I said. Patient denied stressors and reported living alone and described the atmosphere as "comfortable" Patient endorsed alcohol use "3 times a week." Patient is currently unemployed and receives Medicaid. Patient endorsed receiving adequate support from "my mom." Patient is currently not followed by a therapist or psychiatrist and denied follow up. Patient denied SI, HI and AVH and was guarded and not forthcoming with information. Patient's current diagnosis Substance induced mood disorder (HCC). Recommendations include: crisis stabilization, therapeutic milieu, encourage group attendance and participation, medication management for mood stabilization and development of a comprehensive mental wellness plan.  Edward Shelton. 02/26/2024

## 2024-02-26 NOTE — Group Note (Signed)
 Date:  02/26/2024 Time:  9:27 PM  Group Topic/Focus:  Activity Group: The focus of the group is to encourage patients to go outside to the courtyard and get some fresh air and some exercise.    Participation Level:  Did Not Attend   Edward Shelton 02/26/2024, 9:27 PM

## 2024-02-26 NOTE — Group Note (Signed)
 Date:  02/26/2024 Time:  8:33 PM   Additional Comments:  Did not attend group.  Edward Shelton 02/26/2024, 8:33 PM

## 2024-02-26 NOTE — Progress Notes (Signed)
   02/25/24 2000  Psych Admission Type (Psych Patients Only)  Admission Status Voluntary  Psychosocial Assessment  Patient Complaints Irritability;Substance abuse  Eye Contact Fair  Facial Expression Animated  Affect Anxious;Irritable  Speech Soft;Logical/coherent  Interaction Assertive  Motor Activity Slow  Appearance/Hygiene In scrubs  Behavior Characteristics Cooperative  Mood Pleasant  Thought Process  Coherency WDL  Content WDL  Delusions None reported or observed  Perception WDL  Hallucination None reported or observed  Judgment Impaired  Confusion WDL  Danger to Self  Current suicidal ideation? Passive  Self-Injurious Behavior No self-injurious ideation or behavior indicators observed or expressed   Agreement Not to Harm Self Yes  Description of Agreement verbal  Danger to Others  Danger to Others None reported or observed   Patient alert and oriented x 4, affect is blunted, he denies SI/HI/AVH no distress noted, patient was noted interacting appropriately with peers and staff.

## 2024-02-26 NOTE — [facility] Suicide Risk Assessment (Signed)
 [facility] Admission Suicide Risk Assessment   Nursing information obtained from:  Patient Demographic factors:  Male, Adolescent or young adult Current Mental Status:  Suicidal ideation indicated by patient Loss Factors:  NA Historical Factors:  Impulsivity Risk Reduction Factors:  Living with another person, especially a relative, Positive social support  Total Time spent with patient: 1 hour Principal Problem: Substance induced mood disorder (HCC) Diagnosis:  Principal Problem:   Substance induced mood disorder (HCC)  Subjective Data: Summary: 23 year old male with history of depressive disorder with psychotic features, polysubstance use, and passive SI presents voluntarily for stabilization. Restarted on Abilify  and Prozac  at lower doses; nicotine  replacement initiated. Labs notable for mild hypokalemia (K? 3.4), low calcium, mildly elevated glucose, EtOH 293, UDS positive for cocaine.   Continued Clinical Symptoms:  Alcohol Use Disorder Identification Test Final Score (AUDIT): 5 The Alcohol Use Disorders Identification Test, Guidelines for Use in Primary Care, Second Edition.  World Science Writer [facility]). Score between 0-7:  no or low risk or alcohol related problems. Score between 8-15:  moderate risk of alcohol related problems. Score between 16-19:  high risk of alcohol related problems. Score 20 or above:  warrants further diagnostic evaluation for alcohol dependence and treatment.   CLINICAL FACTORS:   Depression:   Impulsivity Alcohol/Substance Abuse/Dependencies Unstable or Poor Therapeutic Relationship   Musculoskeletal: Strength & Muscle Tone: within normal limits Gait & Station: normal Patient leans: N/A  Psychiatric Specialty Exam:  Presentation  General Appearance:  Casual  Eye Contact: Fair  Speech: Clear and Coherent  Speech Volume: Normal  Handedness: Right   Mood and Affect  Mood: Anxious  Affect: Congruent   Thought Process   Thought Processes: Linear; Goal Directed  Descriptions of Associations:Intact  Orientation:Full (Time, Place and Person)  Thought Content:Logical  History of Schizophrenia/Schizoaffective disorder:No  Duration of Psychotic Symptoms:No data recorded Hallucinations:Hallucinations: None  Ideas of Reference:None  Suicidal Thoughts:Suicidal Thoughts: Yes, Passive SI Passive Intent and/or Plan: Without Intent; Without Plan  Homicidal Thoughts:No data recorded  Sensorium  Memory: Immediate Good; Recent Good  Judgment: Poor  Insight: Shallow   Executive Functions  Concentration: Fair  Attention Span: Fair  Recall: Fair  Fund of Knowledge: Fair  Language: Fair   Psychomotor Activity  Psychomotor Activity: Psychomotor Activity: Normal   Assets  Assets: Communication Skills; Financial Resources/Insurance; Housing; Social Support   Sleep  Sleep: Sleep: Fair    Physical Exam: Physical Exam ROS Blood pressure (!) 128/93, pulse 85, temperature 98.7 F (37.1 C), resp. rate 18, height 5' 3 (1.6 m), weight 58.1 kg, SpO2 91%. Body mass index is 22.67 kg/m.   COGNITIVE FEATURES THAT CONTRIBUTE TO RISK:  Closed-mindedness    SUICIDE RISK:   Moderate:  Frequent suicidal ideation with limited intensity, and duration, some specificity in terms of plans, no associated intent, good self-control, limited dysphoria/symptomatology, some risk factors present, and identifiable protective factors, including available and accessible social support.  PLAN OF CARE: 1.    Safety and Monitoring:   --  Voluntary admission to inpatient psychiatric unit for safety, stabilization and treatment -- Daily contact with patient to assess and evaluate symptoms and progress in treatment -- Patient's case to be discussed in multi-disciplinary team meeting -- Observation Level : q15 minute checks -- Vital signs:  q12 hours -- Precautions: suicide   I certify that inpatient  services furnished can reasonably be expected to improve the patient's condition.   Donnice FORBES Right, PA-C 02/26/2024, 3:20 PM

## 2024-02-26 NOTE — Plan of Care (Signed)
   Problem: Education: Goal: Emotional status will improve Outcome: Progressing Goal: Mental status will improve Outcome: Progressing   Problem: Activity: Goal: Interest or engagement in activities will improve Outcome: Progressing

## 2024-02-26 NOTE — [facility] Suicide Risk Assessment (Signed)
 [facility] INPATIENT:  Family/Significant Other Suicide Prevention Education  Suicide Prevention Education:  Patient Refusal for Family/Significant Other Suicide Prevention Education: The patient Edward Shelton has refused to provide written consent for family/significant other to be provided Family/Significant Other Suicide Prevention Education during admission and/or prior to discharge.  Physician notified.  SPE completed with pt, as pt refused to consent to family contact. SPI pamphlet provided to pt and pt was encouraged to share information with support network, ask questions, and talk about any concerns relating to SPE. Pt denies access to guns/firearms and verbalized understanding of information provided. Mobile Crisis information also provided to pt.    [name] [name] 02/26/2024, 10:13 AM

## 2024-02-26 NOTE — Progress Notes (Signed)
   02/26/24 0915  Psych Admission Type (Psych Patients Only)  Admission Status Voluntary  Psychosocial Assessment  Patient Complaints Substance abuse  Eye Contact Fair  Facial Expression Animated  Affect Anxious  Speech Soft  Interaction Minimal  Motor Activity Other (Comment) (WNL)  Appearance/Hygiene In scrubs  Behavior Characteristics Cooperative  Mood Pleasant  Thought Process  Coherency WDL  Content WDL  Delusions None reported or observed  Perception WDL  Hallucination None reported or observed  Judgment Impaired  Confusion WDL  Danger to Self  Current suicidal ideation? Denies  Self-Injurious Behavior No self-injurious ideation or behavior indicators observed or expressed   Agreement Not to Harm Self Yes  Description of Agreement verbal  Danger to Others  Danger to Others None reported or observed

## 2024-02-27 NOTE — Group Note (Signed)
 LCSW Group Therapy Note  Group Date: 02/27/2024 Start Time: 1300 End Time: 1400   Type of Therapy and Topic:  Group Therapy: Anger Cues and Responses  Participation Level:  Did Not Attend   Description of Group:   In this group, patients learned how to recognize the physical, cognitive, emotional, and behavioral responses they have to anger-provoking situations.  They identified a recent time they became angry and how they reacted.  They analyzed how their reaction was possibly beneficial and how it was possibly unhelpful.  The group discussed a variety of healthier coping skills that could help with such a situation in the future.  Focus was placed on how helpful it is to recognize the underlying emotions to our anger, because working on those can lead to a more permanent solution as well as our ability to focus on the important rather than the urgent.  Therapeutic Goals: Patients will remember their last incident of anger and how they felt emotionally and physically, what their thoughts were at the time, and how they behaved. Patients will identify how their behavior at that time worked for them, as well as how it worked against them. Patients will explore possible new behaviors to use in future anger situations. Patients will learn that anger itself is normal and cannot be eliminated, and that healthier reactions can assist with resolving conflict rather than worsening situations.  Summary of Patient Progress:  Patient declined to attend group.  Therapeutic Modalities:   Cognitive Behavioral Therapy    Sherryle JINNY Margo, LCSW 02/27/2024  3:16 PM

## 2024-02-27 NOTE — Progress Notes (Signed)
   02/27/24 0931  Psych Admission Type (Psych Patients Only)  Admission Status Voluntary/72 hour document signed  Date 72 hour document signed  02/26/24  Time 72 hour document signed  1030  Psychosocial Assessment  Patient Complaints None  Eye Contact Brief  Facial Expression Flat  Affect Blunted;Irritable  Speech Logical/coherent  Interaction Guarded;Minimal  Motor Activity Slow  Appearance/Hygiene Unremarkable  Behavior Characteristics Guarded;Irritable  Mood Irritable  Aggressive Behavior  Effect No apparent injury  Thought Process  Coherency WDL  Content WDL  Delusions None reported or observed  Perception WDL  Hallucination None reported or observed  Judgment Poor  Confusion WDL  Danger to Self  Current suicidal ideation? Denies  Self-Injurious Behavior No self-injurious ideation or behavior indicators observed or expressed   Agreement Not to Harm Self Yes  Description of Agreement verbal  Danger to Others  Danger to Others None reported or observed

## 2024-02-27 NOTE — Progress Notes (Signed)
   02/26/24 2000  Psych Admission Type (Psych Patients Only)  Admission Status Voluntary/72 hour document signed  Psychosocial Assessment  Patient Complaints None  Eye Contact Fair  Facial Expression Animated  Affect Anxious;Irritable  Speech Soft;Logical/coherent  Interaction Assertive  Motor Activity Slow  Appearance/Hygiene In scrubs  Behavior Characteristics Cooperative;Appropriate to situation  Mood Pleasant  Thought Process  Coherency WDL  Content WDL  Delusions None reported or observed  Perception WDL  Hallucination None reported or observed  Judgment Impaired  Confusion WDL  Danger to Self  Current suicidal ideation? Passive  Self-Injurious Behavior No self-injurious ideation or behavior indicators observed or expressed   Agreement Not to Harm Self Yes  Description of Agreement verbal  Danger to Others  Danger to Others None reported or observed   Patient is alert an oriented x 4,thoughts are organized, no distress noted interacting appropriately with peers and staff, 15 minutes safety checks maintained.

## 2024-02-27 NOTE — Group Note (Signed)
 Recreation Therapy Group Note   Group Topic:General Recreation  Group Date: 02/27/2024 Start Time: 1400 End Time: 1455 Facilitators: Celestia Jeoffrey BRAVO, LRT, CTRS Location: Courtyard  Group Description: [employer]. LRT and patients played games of basketball, drew with chalk, and played corn hole while outside in the courtyard while getting fresh air and sunlight. Music was being played in the background. LRT and peers conversed about different games they have played before, what they do in their free time and anything else that is on their minds. LRT encouraged pts to drink water after being outside, sweating and getting their heart rate up.  Goal Area(s) Addressed: Patient will build on frustration tolerance skills. Patients will partake in a competitive play game with peers. Patients will gain knowledge of new leisure interest/hobby.    Affect/Mood: N/A   Participation Level: Did not attend    Clinical Observations/Individualized Feedback: Patient did not attend group.   Plan: Continue to engage patient in RT group sessions 2-3x/week.   Jeoffrey BRAVO Celestia, LRT, CTRS 02/27/2024 4:43 PM

## 2024-02-27 NOTE — Plan of Care (Signed)
  Problem: Education: Goal: Knowledge of Edward Shelton General Education information/materials will improve Outcome: Progressing   Problem: Education: Goal: Emotional status will improve Outcome: Progressing   Problem: Education: Goal: Mental status will improve Outcome: Progressing   

## 2024-02-27 NOTE — Group Note (Deleted)
 Date:  02/27/2024 Time:  5:51 PM  Group Topic/Focus:  Building Self Esteem:   The Focus of this group is helping patients become aware of the effects of self-esteem on their lives, the things they and others do that enhance or undermine their self-esteem, seeing the relationship between their level of self-esteem and the choices they make and learning ways to enhance self-esteem.     Participation Level:  {BHH PARTICIPATION OZCZO:77735}  Participation Quality:  {BHH PARTICIPATION QUALITY:22265}  Affect:  {BHH AFFECT:22266}  Cognitive:  {BHH COGNITIVE:22267}  Insight: {BHH Insight2:20797}  Engagement in Group:  {BHH ENGAGEMENT IN HMNLE:77731}  Modes of Intervention:  {BHH MODES OF INTERVENTION:22269}  Additional Comments:  ***  [name] HERO Michi Herrmann 02/27/2024, 5:51 PM

## 2024-02-27 NOTE — Group Note (Signed)
 Date:  02/27/2024 Time:  6:02 PM  Group Topic/Focus:  Goals Group:   The focus of this group is to help patients establish daily goals to achieve during treatment and discuss how the patient can incorporate goal setting into their daily lives to aide in recovery.    Participation Level:  Did Not Attend   Edward Shelton 02/27/2024, 6:02 PM

## 2024-02-27 NOTE — Progress Notes (Signed)
 [facility] MD Progress Note  02/27/2024 2:48 PM Edward Shelton  MRN:  [id number]   Subjective:  Chart reviewed, case discussed in multidisciplinary meeting, patient seen during rounds.   11/24: On interview today, patient is noted to be calm and cooperative, alert and oriented.  He reports tolerating medication regimen well without adverse effects.  He denies current symptoms of anxiety or depression.  There is concern for minimization of symptoms, though patient demonstrates insight into substance abuse being a trigger for psychiatric symptoms. He denies SI/HI/plan and denies hallucinations.  He reports motivation to become sober and is willing to participate in substance abuse programs if available virtually.  He does not voice any concerns or complaints at this time.  He did not require any behavioral PRNs overnight. Patient signed 72-hour form on 11/23.    Past Psychiatric History: see h&P Family History:  Family History  Problem Relation Age of Onset   Healthy Mother    Healthy Father    Social History:  Social History   Substance and Sexual Activity  Alcohol Use Yes   Alcohol/week: 3.0 standard drinks of alcohol   Types: 3 Cans of beer per week     Social History   Substance and Sexual Activity  Drug Use Yes   Types: Marijuana, IV   Comment: shrooms, MDMA, ketamine    Social History   Socioeconomic History   Marital status: Single    Spouse name: Not on file   Number of children: Not on file   Years of education: Not on file   Highest education level: Not on file  Occupational History   Not on file  Tobacco Use   Smoking status: Every Day    Types: Cigarettes   Smokeless tobacco: Current  Vaping Use   Vaping status: Every Day  Substance and Sexual Activity   Alcohol use: Yes    Alcohol/week: 3.0 standard drinks of alcohol    Types: 3 Cans of beer per week   Drug use: Yes    Types: Marijuana, IV    Comment: shrooms, MDMA, ketamine   Sexual activity: Yes   Other Topics Concern   Not on file  Social History Narrative   Not on file   Social Drivers of Health   Financial Resource Strain: Not on file  Food Insecurity: Patient Declined (02/25/2024)   Hunger Vital Sign    Worried About Running Out of Food in the Last Year: Patient declined    Ran Out of Food in the Last Year: Patient declined  Transportation Needs: No Transportation Needs (02/25/2024)   PRAPARE - Administrator, Civil Service (Medical): No    Lack of Transportation (Non-Medical): No  Physical Activity: Not on file  Stress: Not on file  Social Connections: Not on file   Past Medical History:  Past Medical History:  Diagnosis Date   Depression    History reviewed. No pertinent surgical history.  Current Medications: Current Facility-Administered Medications  Medication Dose Route Frequency Provider Last Rate Last Admin   acetaminophen  (TYLENOL ) tablet 650 mg  650 mg Oral Q6H PRN Hampton, Tracie B, NP       alum & mag hydroxide-simeth (MAALOX/MYLANTA) 200-200-20 MG/5ML suspension 30 mL  30 mL Oral Q4H PRN Hampton, Tracie B, NP       ARIPiprazole  (ABILIFY ) tablet 10 mg  10 mg Oral Daily Millington, Matthew E, PA-C   10 mg at 02/27/24 9158   haloperidol  lactate (HALDOL ) injection 5 mg  5  mg Intramuscular TID PRN Hampton, Tracie B, NP       And   diphenhydrAMINE  (BENADRYL ) injection 50 mg  50 mg Intramuscular TID PRN Hampton, Tracie B, NP       And   LORazepam  (ATIVAN ) injection 2 mg  2 mg Intramuscular TID PRN Hampton, Tracie B, NP       haloperidol  lactate (HALDOL ) injection 10 mg  10 mg Intramuscular TID PRN Hampton, Tracie B, NP       And   diphenhydrAMINE  (BENADRYL ) injection 50 mg  50 mg Intramuscular TID PRN Hampton, Tracie B, NP       And   LORazepam  (ATIVAN ) injection 2 mg  2 mg Intramuscular TID PRN Hampton, Tracie B, NP       FLUoxetine  (PROZAC ) capsule 20 mg  20 mg Oral Daily Millington, Matthew E, PA-C   20 mg at 02/27/24 9158   folic acid   (FOLVITE ) tablet 1 mg  1 mg Oral Daily Hampton, Tracie B, NP   1 mg at 02/27/24 9158   hydrOXYzine  (ATARAX ) tablet 50 mg  50 mg Oral Q6H PRN Hampton, Tracie B, NP       magnesium  hydroxide (MILK OF MAGNESIA) suspension 30 mL  30 mL Oral Daily PRN Hampton, Tracie B, NP       nicotine  (NICODERM CQ  - dosed in mg/24 hours) patch 21 mg  21 mg Transdermal Q0600 Hampton, Tracie B, NP   21 mg at 02/26/24 9057   nicotine  polacrilex (NICORETTE ) gum 2 mg  2 mg Oral PRN Millington, Matthew E, PA-C   2 mg at 02/26/24 1459    Lab Results:  Results for orders placed or performed during the hospital encounter of 02/25/24 (from the past 48 hours)  Glucose, capillary     Status: Abnormal   Collection Time: 02/26/24  8:25 PM  Result Value Ref Range   Glucose-Capillary 104 (H) 70 - 99 mg/dL    Comment: Glucose reference range applies only to samples taken after fasting for at least 8 hours.   Comment 1 Notify RN     Blood Alcohol level:  Lab Results  Component Value Date   ETH 293 (H) 02/24/2024   ETH <15 02/17/2024    Metabolic Disorder Labs: No results found for: HGBA1C, MPG No results found for: PROLACTIN No results found for: CHOL, TRIG, HDL, CHOLHDL, VLDL, LDLCALC  Physical Findings: AIMS:  , ,  ,  ,    CIWA:    COWS:      Psychiatric Specialty Exam:  Presentation  General Appearance:  Casual  Eye Contact: Fair  Speech: Clear and Coherent  Speech Volume: Normal    Mood and Affect  Mood: Euthymic  Affect: Congruent   Thought Process  Thought Processes: Linear; Goal Directed  Orientation:Full (Time, Place and Person)  Thought Content:Logical  Hallucinations:Hallucinations: None  Ideas of Reference:None  Suicidal Thoughts: Denies  Homicidal Thoughts: Denies  Sensorium  Memory: Immediate Good; Recent Good  Judgment: Poor  Insight: Fair   Chartered Certified Accountant: Fair  Attention Span: Fair  Recall: [employer] of  Knowledge: Fair  Language: Fair   Psychomotor Activity  Psychomotor Activity: Psychomotor Activity: Normal  Musculoskeletal: Strength & Muscle Tone: within normal limits Gait & Station: normal Assets  Assets: Manufacturing Systems Engineer; Financial Resources/Insurance; Housing; Social Support    Physical Exam: Physical Exam ROS Blood pressure 114/79, pulse 69, temperature 98.7 F (37.1 C), temperature source Oral, resp. rate 16, height 5' 3 (1.6 m), weight  58.1 kg, SpO2 100%. Body mass index is 22.67 kg/m.  Diagnosis: Principal Problem:   Substance induced mood disorder (HCC)   PLAN: Safety and Monitoring:  -- Voluntary admission to inpatient psychiatric unit for safety, stabilization and treatment  -- Daily contact with patient to assess and evaluate symptoms and progress in treatment  -- Patient's case to be discussed in multi-disciplinary team meeting  -- Observation Level : q15 minute checks  -- Vital signs:  q12 hours  -- Precautions: suicide, elopement, and assault -- Encouraged patient to participate in unit milieu and in scheduled group therapies  2. Psychiatric Treatment:  Scheduled Medications: Abilify  10 mg daily Prozac  20 mg daily     -- The risks/benefits/side-effects/alternatives to this medication were discussed in detail with the patient and time was given for questions. The patient consents to medication trial.  3. Medical Issues Being Addressed:  No acute concerns.   4. Discharge Planning:   -- Social work and case management to assist with discharge planning and identification of hospital follow-up needs prior to discharge  -- Estimated LOS: 3-4 days  Camelia LITTIE Lukes, PA-C 02/27/2024, 2:48 PM

## 2024-02-27 NOTE — Plan of Care (Signed)
   Problem: Education: Goal: Emotional status will improve Outcome: Progressing

## 2024-02-27 NOTE — BH IP Treatment Plan (Signed)
 Interdisciplinary Treatment and Diagnostic Plan Update  02/27/2024 Time of Session: 11:11 Edward Shelton MRN: [id number]  Principal Diagnosis: Substance induced mood disorder (HCC)  Secondary Diagnoses: Principal Problem:   Substance induced mood disorder (HCC)   Current Medications:  Current Facility-Administered Medications  Medication Dose Route Frequency Provider Last Rate Last Admin   acetaminophen  (TYLENOL ) tablet 650 mg  650 mg Oral Q6H PRN Hampton, Tracie B, NP       alum & mag hydroxide-simeth (MAALOX/MYLANTA) 200-200-20 MG/5ML suspension 30 mL  30 mL Oral Q4H PRN Hampton, Tracie B, NP       ARIPiprazole  (ABILIFY ) tablet 10 mg  10 mg Oral Daily Millington, Matthew E, PA-C   10 mg at 02/27/24 9158   haloperidol  lactate (HALDOL ) injection 5 mg  5 mg Intramuscular TID PRN Hampton, Tracie B, NP       And   diphenhydrAMINE  (BENADRYL ) injection 50 mg  50 mg Intramuscular TID PRN Hampton, Tracie B, NP       And   LORazepam  (ATIVAN ) injection 2 mg  2 mg Intramuscular TID PRN Hampton, Tracie B, NP       haloperidol  lactate (HALDOL ) injection 10 mg  10 mg Intramuscular TID PRN Hampton, Tracie B, NP       And   diphenhydrAMINE  (BENADRYL ) injection 50 mg  50 mg Intramuscular TID PRN Hampton, Tracie B, NP       And   LORazepam  (ATIVAN ) injection 2 mg  2 mg Intramuscular TID PRN Hampton, Tracie B, NP       FLUoxetine  (PROZAC ) capsule 20 mg  20 mg Oral Daily Millington, Matthew E, PA-C   20 mg at 02/27/24 9158   folic acid  (FOLVITE ) tablet 1 mg  1 mg Oral Daily Hampton, Tracie B, NP   1 mg at 02/27/24 0841   hydrOXYzine  (ATARAX ) tablet 50 mg  50 mg Oral Q6H PRN Hampton, Tracie B, NP       magnesium  hydroxide (MILK OF MAGNESIA) suspension 30 mL  30 mL Oral Daily PRN Hampton, Tracie B, NP       nicotine  (NICODERM CQ  - dosed in mg/24 hours) patch 21 mg  21 mg Transdermal Q0600 Hampton, Tracie B, NP   21 mg at 02/26/24 9057   nicotine  polacrilex (NICORETTE ) gum 2 mg  2 mg Oral PRN  Millington, Matthew E, PA-C   2 mg at 02/26/24 1459   PTA Medications: Medications Prior to Admission  Medication Sig Dispense Refill Last Dose/Taking   ARIPiprazole  (ABILIFY ) 20 MG tablet Take one tablet (20 mg dose) by mouth daily for 14 days. (Patient not taking: Reported on 02/18/2024)      FLUoxetine  (PROZAC ) 40 MG capsule Take one capsule (40 mg dose) by mouth daily for 14 days. (Patient not taking: Reported on 02/18/2024)      guanFACINE  (INTUNIV ) 2 MG TB24 ER tablet Take 2 mg by mouth daily. (Patient not taking: Reported on 02/18/2024)      hydrOXYzine  (VISTARIL ) 25 MG capsule Take 25 mg by mouth every 6 (six) hours as needed for anxiety. (Patient not taking: Reported on 02/18/2024)      mirtazapine  (REMERON ) 15 MG tablet Take 15 mg by mouth at bedtime. (Patient not taking: Reported on 02/18/2024)      traZODone  (DESYREL ) 50 MG tablet Take 50 mg by mouth at bedtime. (Patient not taking: Reported on 02/18/2024)       Patient Stressors:    Patient Strengths:    Treatment Modalities: Medication Management, Group therapy,  Case management,  1 to 1 session with clinician, Psychoeducation, Recreational therapy.   Physician Treatment Plan for Primary Diagnosis: Substance induced mood disorder (HCC) Long Term Goal(s): Improvement in symptoms so as ready for discharge   Short Term Goals: Ability to identify changes in lifestyle to reduce recurrence of condition will improve Ability to verbalize feelings will improve Ability to disclose and discuss suicidal ideas Ability to demonstrate self-control will improve Ability to identify and develop effective coping behaviors will improve Ability to maintain clinical measurements within normal limits will improve Compliance with prescribed medications will improve Ability to identify triggers associated with substance abuse/mental health issues will improve  Medication Management: Evaluate patient's response, side effects, and tolerance of  medication regimen.  Therapeutic Interventions: 1 to 1 sessions, Unit Group sessions and Medication administration.  Evaluation of Outcomes: Not Met  Physician Treatment Plan for Secondary Diagnosis: Principal Problem:   Substance induced mood disorder (HCC)  Long Term Goal(s): Improvement in symptoms so as ready for discharge   Short Term Goals: Ability to identify changes in lifestyle to reduce recurrence of condition will improve Ability to verbalize feelings will improve Ability to disclose and discuss suicidal ideas Ability to demonstrate self-control will improve Ability to identify and develop effective coping behaviors will improve Ability to maintain clinical measurements within normal limits will improve Compliance with prescribed medications will improve Ability to identify triggers associated with substance abuse/mental health issues will improve     Medication Management: Evaluate patient's response, side effects, and tolerance of medication regimen.  Therapeutic Interventions: 1 to 1 sessions, Unit Group sessions and Medication administration.  Evaluation of Outcomes: Not Met   RN Treatment Plan for Primary Diagnosis: Substance induced mood disorder (HCC) Long Term Goal(s): Knowledge of disease and therapeutic regimen to maintain health will improve  Short Term Goals: Ability to remain free from injury will improve, Ability to verbalize frustration and anger appropriately will improve, Ability to demonstrate self-control, Ability to participate in decision making will improve, Ability to verbalize feelings will improve, Ability to disclose and discuss suicidal ideas, Ability to identify and develop effective coping behaviors will improve, and Compliance with prescribed medications will improve  Medication Management: RN will administer medications as ordered by provider, will assess and evaluate patient's response and provide education to patient for prescribed  medication. RN will report any adverse and/or side effects to prescribing provider.  Therapeutic Interventions: 1 on 1 counseling sessions, Psychoeducation, Medication administration, Evaluate responses to treatment, Monitor vital signs and CBGs as ordered, Perform/monitor CIWA, COWS, AIMS and Fall Risk screenings as ordered, Perform wound care treatments as ordered.  Evaluation of Outcomes: Not Met   LCSW Treatment Plan for Primary Diagnosis: Substance induced mood disorder (HCC) Long Term Goal(s): Safe transition to appropriate next level of care at discharge, Engage patient in therapeutic group addressing interpersonal concerns.  Short Term Goals: Engage patient in aftercare planning with referrals and resources, Increase social support, Increase ability to appropriately verbalize feelings, Increase emotional regulation, Facilitate acceptance of mental health diagnosis and concerns, Facilitate patient progression through stages of change regarding substance use diagnoses and concerns, Identify triggers associated with mental health/substance abuse issues, and Increase skills for wellness and recovery  Therapeutic Interventions: Assess for all discharge needs, 1 to 1 time with Social worker, Explore available resources and support systems, Assess for adequacy in community support network, Educate family and significant other(s) on suicide prevention, Complete Psychosocial Assessment, Interpersonal group therapy.  Evaluation of Outcomes: Not Met   Progress in  Treatment: Attending groups: No. Participating in groups: No. Taking medication as prescribed: Yes. Toleration medication: Yes. Family/Significant other contact made: No, will contact:  if given permission.  Patient understands diagnosis: Yes. Discussing patient identified problems/goals with staff: Yes. Medical problems stabilized or resolved: Yes. Denies suicidal/homicidal ideation: Yes. Issues/concerns per patient self-inventory:  No. Other: none.   New problem(s) identified: No, Describe:  none identified.  New Short Term/Long Term Goal(s): medication management for mood stabilization; elimination of SI thoughts; development of comprehensive mental wellness/sobriety plan.  Patient Goals:  I just started working last week. Pt expressed desire for discharge.   Discharge Plan or Barriers: CSW will assist pt with development of an appropriate aftercare/discharge plan.   Reason for Continuation of Hospitalization: Depression Medication stabilization Suicidal ideation  Estimated Length of Stay: 1-7 days  Last 3 [place] Suicide Severity Risk Score: Flowsheet Row Admission (Current) from 02/25/2024 in [facility] INPAT[facility] BE[facility]ICINE ED from 02/24/2024 in [facility] Emergency Department at [facility] ED from 02/17/2024 in [facility] Emergency Department at [facility]  C-SSRS RISK CATEGORY Error: Q7 should not be populated when Q6 is No High Risk High Risk    Last PHQ 2/9 Scores:     No data to display          Scribe for Treatment Team: Nadara JONELLE Fam, LCSW 02/27/2024 11:32 AM

## 2024-02-28 NOTE — Plan of Care (Signed)
  Problem: Education: Goal: Knowledge of  General Education information/materials will improve Outcome: Not Progressing Goal: Emotional status will improve Outcome: Not Progressing Goal: Mental status will improve Outcome: Not Progressing Goal: Verbalization of understanding the information provided will improve Outcome: Not Progressing   Problem: Activity: Goal: Interest or engagement in activities will improve Outcome: Not Progressing Goal: Sleeping patterns will improve Outcome: Not Progressing   Problem: Coping: Goal: Ability to verbalize frustrations and anger appropriately will improve Outcome: Not Progressing Goal: Ability to demonstrate self-control will improve Outcome: Not Progressing   Problem: Health Behavior/Discharge Planning: Goal: Identification of resources available to assist in meeting health care needs will improve Outcome: Not Progressing Goal: Compliance with treatment plan for underlying cause of condition will improve Outcome: Not Progressing   Problem: Physical Regulation: Goal: Ability to maintain clinical measurements within normal limits will improve Outcome: Not Progressing   Problem: Safety: Goal: Periods of time without injury will increase Outcome: Not Progressing   Problem: Education: Goal: Ability to state activities that reduce stress will improve Outcome: Not Progressing   Problem: Education: Goal: Utilization of techniques to improve thought processes will improve Outcome: Not Progressing Goal: Knowledge of the prescribed therapeutic regimen will improve Outcome: Not Progressing   Problem: Education: Goal: Ability to make informed decisions regarding treatment will improve Outcome: Not Progressing   Problem: Coping: Goal: Coping ability will improve Outcome: Not Progressing

## 2024-02-28 NOTE — Plan of Care (Signed)
  Problem: Education: Goal: Emotional status will improve Outcome: Progressing   Problem: Education: Goal: Mental status will improve Outcome: Progressing   Problem: Education: Goal: Verbalization of understanding the information provided will improve Outcome: Progressing   Problem: Activity: Goal: Interest or engagement in activities will improve Outcome: Progressing   Problem: Coping: Goal: Ability to verbalize frustrations and anger appropriately will improve Outcome: Progressing

## 2024-02-28 NOTE — Group Note (Signed)
 [facility] LCSW Group Therapy Note   Group Date: 02/28/2024 Start Time: 1300 End Time: 1400  Type of Therapy/Topic:  Group Therapy:  Feelings about Diagnosis  Participation Level:  Did Not Attend   Description of Group:    This group will allow patients to explore their thoughts and feelings about diagnoses they have received. Patients will be guided to explore their level of understanding and acceptance of these diagnoses. Facilitator will encourage patients to process their thoughts and feelings about the reactions of others to their diagnosis, and will guide patients in identifying ways to discuss their diagnosis with significant others in their lives. This group will be process-oriented, with patients participating in exploration of their own experiences as well as giving and receiving support and challenge from other group members.   Therapeutic Goals: 1. Patient will demonstrate understanding of diagnosis as evidence by identifying two or more symptoms of the disorder:  2. Patient will be able to express two feelings regarding the diagnosis 3. Patient will demonstrate ability to communicate their needs through discussion and/or role plays  Summary of Patient Progress: Patient did not attend group.   Therapeutic Modalities:   Cognitive Behavioral Therapy Brief Therapy Feelings Identification    Nadara JONELLE Fam, LCSW

## 2024-02-28 NOTE — Progress Notes (Signed)
   02/28/24 1600  Psych Admission Type (Psych Patients Only)  Admission Status Voluntary/72 hour document signed  Date 72 hour document signed  02/26/24  Time 72 hour document signed  1030  Psychosocial Assessment  Patient Complaints None;Isolation  Eye Contact Brief  Facial Expression Flat  Affect Blunted  Speech Logical/coherent  Interaction Cautious;Guarded;Minimal  Motor Activity Slow  Appearance/Hygiene Unremarkable  Behavior Characteristics Guarded  Mood Pleasant  Aggressive Behavior  Effect No apparent injury  Thought Process  Coherency WDL  Content WDL  Delusions None reported or observed  Perception WDL  Hallucination None reported or observed  Judgment Poor  Confusion WDL  Danger to Self  Current suicidal ideation? Denies  Self-Injurious Behavior No self-injurious ideation or behavior indicators observed or expressed   Danger to Others  Danger to Others None reported or observed

## 2024-02-28 NOTE — Progress Notes (Signed)
 [facility] MD Progress Note  02/28/2024 8:41 PM Edward Shelton  MRN:  [id number]   Subjective:  Chart reviewed, case discussed in multidisciplinary meeting, patient seen during rounds.   11/25: On interview today, patient is noted to be calm and cooperative, alert and oriented.  He denies current symptoms of depression or anxiety.  He denies SI/HI/plan and denies hallucinations.  He remains linear, logical, and future oriented, stating he is looking forward to starting a new job.  He reports good sleep and stable appetite.  He is able to discuss coping skills, support system, and crisis resources.  He denies access to guns or other lethal means.  Patient has signed 72 hours which will expire tomorrow.   Collateral obtained from patient's mother, Edward Shelton, Spanish interpreter utilized, Eric [phone].  Patient's mother indicates that patient's primary issue is with substance abuse.  She reports patient has expressed suicidal ideation to her in the past.  Patient has never expressed HI to her.  She denies previous suicide attempt by patient.  She reports recent incident where patient jumped out of her car and started running away, she does not feel that this was a suicide attempt.  She confirms patient has his own home that he can return to.  She confirms patient does not have access to guns or lethal means.  She does not feel patient is at risk to harm self or others at this time.  11/24: On interview today, patient is noted to be calm and cooperative, alert and oriented.  He reports tolerating medication regimen well without adverse effects.  He denies current symptoms of anxiety or depression.  There is concern for minimization of symptoms, though patient demonstrates insight into substance abuse being a trigger for psychiatric symptoms. He denies SI/HI/plan and denies hallucinations.  He reports motivation to become sober and is willing to participate in substance abuse programs if available  virtually.  He does not voice any concerns or complaints at this time.  He did not require any behavioral PRNs overnight. Patient signed 72-hour form on 11/23.    Past Psychiatric History: see h&P Family History:  Family History  Problem Relation Age of Onset   Healthy Mother    Healthy Father    Social History:  Social History   Substance and Sexual Activity  Alcohol Use Yes   Alcohol/week: 3.0 standard drinks of alcohol   Types: 3 Cans of beer per week     Social History   Substance and Sexual Activity  Drug Use Yes   Types: Marijuana, IV   Comment: shrooms, MDMA, ketamine    Social History   Socioeconomic History   Marital status: Single    Spouse name: Not on file   Number of children: Not on file   Years of education: Not on file   Highest education level: Not on file  Occupational History   Not on file  Tobacco Use   Smoking status: Every Day    Types: Cigarettes   Smokeless tobacco: Current  Vaping Use   Vaping status: Every Day  Substance and Sexual Activity   Alcohol use: Yes    Alcohol/week: 3.0 standard drinks of alcohol    Types: 3 Cans of beer per week   Drug use: Yes    Types: Marijuana, IV    Comment: shrooms, MDMA, ketamine   Sexual activity: Yes  Other Topics Concern   Not on file  Social History Narrative   Not on file  Social Drivers of Corporate Investment Banker Strain: Not on file  Food Insecurity: Patient Declined (02/25/2024)   Hunger Vital Sign    Worried About Running Out of Food in the Last Year: Patient declined    Ran Out of Food in the Last Year: Patient declined  Transportation Needs: No Transportation Needs (02/25/2024)   PRAPARE - Administrator, Civil Service (Medical): No    Lack of Transportation (Non-Medical): No  Physical Activity: Not on file  Stress: Not on file  Social Connections: Not on file   Past Medical History:  Past Medical History:  Diagnosis Date   Depression    History reviewed.  No pertinent surgical history.  Current Medications: Current Facility-Administered Medications  Medication Dose Route Frequency Provider Last Rate Last Admin   acetaminophen  (TYLENOL ) tablet 650 mg  650 mg Oral Q6H PRN Hampton, Tracie B, NP       alum & mag hydroxide-simeth (MAALOX/MYLANTA) 200-200-20 MG/5ML suspension 30 mL  30 mL Oral Q4H PRN Hampton, Tracie B, NP       ARIPiprazole  (ABILIFY ) tablet 10 mg  10 mg Oral Daily Millington, Matthew E, PA-C   10 mg at 02/27/24 9158   haloperidol  lactate (HALDOL ) injection 5 mg  5 mg Intramuscular TID PRN Hampton, Tracie B, NP       And   diphenhydrAMINE  (BENADRYL ) injection 50 mg  50 mg Intramuscular TID PRN Hampton, Tracie B, NP       And   LORazepam  (ATIVAN ) injection 2 mg  2 mg Intramuscular TID PRN Hampton, Tracie B, NP       haloperidol  lactate (HALDOL ) injection 10 mg  10 mg Intramuscular TID PRN Hampton, Tracie B, NP       And   diphenhydrAMINE  (BENADRYL ) injection 50 mg  50 mg Intramuscular TID PRN Hampton, Tracie B, NP       And   LORazepam  (ATIVAN ) injection 2 mg  2 mg Intramuscular TID PRN Hampton, Tracie B, NP       FLUoxetine  (PROZAC ) capsule 20 mg  20 mg Oral Daily Millington, Matthew E, PA-C   20 mg at 02/27/24 9158   folic acid  (FOLVITE ) tablet 1 mg  1 mg Oral Daily Hampton, Tracie B, NP   1 mg at 02/27/24 9158   hydrOXYzine  (ATARAX ) tablet 50 mg  50 mg Oral Q6H PRN Hampton, Tracie B, NP       magnesium  hydroxide (MILK OF MAGNESIA) suspension 30 mL  30 mL Oral Daily PRN Hampton, Tracie B, NP       nicotine  (NICODERM CQ  - dosed in mg/24 hours) patch 21 mg  21 mg Transdermal Q0600 Hampton, Tracie B, NP   21 mg at 02/26/24 9057   nicotine  polacrilex (NICORETTE ) gum 2 mg  2 mg Oral PRN Millington, Matthew E, PA-C   2 mg at 02/26/24 1459    Lab Results:  No results found for this or any previous visit (from the past 48 hours).   Blood Alcohol level:  Lab Results  Component Value Date   ETH 293 (H) 02/24/2024   ETH <15  02/17/2024    Metabolic Disorder Labs: No results found for: HGBA1C, MPG No results found for: PROLACTIN No results found for: CHOL, TRIG, HDL, CHOLHDL, VLDL, LDLCALC  Physical Findings: AIMS:  , ,  ,  ,    CIWA:    COWS:      Psychiatric Specialty Exam:  Presentation  General Appearance:  Casual  Eye Contact:  Fair  Speech: Clear and Coherent  Speech Volume: Normal    Mood and Affect  Mood: Euthymic  Affect: Congruent   Thought Process  Thought Processes: Linear; Goal Directed  Orientation:Full (Time, Place and Person)  Thought Content:Logical  Hallucinations: No  Ideas of Reference:None  Suicidal Thoughts: Denies  Homicidal Thoughts: Denies  Sensorium  Memory: Immediate Good; Recent Good  Judgment: Fair  Insight: Fair   Chartered Certified Accountant: Fair  Attention Span: Fair  Recall: [employer] of Knowledge: Fair  Language: Fair   Psychomotor Activity  Psychomotor Activity:  Normal  Musculoskeletal: Strength & Muscle Tone: within normal limits Gait & Station: normal Assets  Assets: Manufacturing Systems Engineer; Financial Resources/Insurance; Housing; Social Support    Physical Exam: Physical Exam ROS Blood pressure 115/73, pulse 69, temperature 98.6 F (37 C), temperature source Oral, resp. rate 16, height 5' 3 (1.6 m), weight 58.1 kg, SpO2 100%. Body mass index is 22.67 kg/m.  Diagnosis: Principal Problem:   Substance induced mood disorder (HCC)   PLAN: Safety and Monitoring:  -- Voluntary admission to inpatient psychiatric unit for safety, stabilization and treatment  -- Daily contact with patient to assess and evaluate symptoms and progress in treatment  -- Patient's case to be discussed in multi-disciplinary team meeting  -- Observation Level : q15 minute checks  -- Vital signs:  q12 hours  -- Precautions: suicide, elopement, and assault -- Encouraged patient to participate in  unit milieu and in scheduled group therapies  2. Psychiatric Treatment:  Scheduled Medications: Abilify  10 mg daily Prozac  20 mg daily     -- The risks/benefits/side-effects/alternatives to this medication were discussed in detail with the patient and time was given for questions. The patient consents to medication trial.  3. Medical Issues Being Addressed:  No acute concerns.   4. Discharge Planning:   -- Social work and case management to assist with discharge planning and identification of hospital follow-up needs prior to discharge  -- Estimated LOS: 3-4 days  Camelia LITTIE Lukes, PA-C 02/28/2024, 8:41 PM

## 2024-02-28 NOTE — Group Note (Signed)
 Recreation Therapy Group Note   Group Topic:Coping Skills  Group Date: 02/28/2024 Start Time: 1000 End Time: 1050 Facilitators: Celestia Jeoffrey BRAVO, LRT, CTRS Location: Craft Room  Group Description: Mind Map.  Patient was provided a blank template of a diagram with 32 blank boxes in a tiered system, branching from the center (similar to a bubble chart). LRT directed patients to label the middle of the diagram Coping Skills. LRT and patients then came up with 8 different coping skills as examples. Pt were directed to record their coping skills in the 2nd tier boxes closest to the center.  Patients would then share their coping skills with the group as LRT wrote them out. LRT gave a handout of 99 different coping skills at the end of group.   Goal Area(s) Addressed: Patients will be able to define "coping skills". Patient will identify new coping skills.  Patient will increase communication.   Affect/Mood: N/A   Participation Level: Did not attend    Clinical Observations/Individualized Feedback: Patient did not attend group.   Plan: Continue to engage patient in RT group sessions 2-3x/week.   Jeoffrey BRAVO Celestia, LRT, CTRS 02/28/2024 12:43 PM
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
## 2024-02-28 NOTE — Group Note (Signed)
 Date:  02/28/2024 Time:  8:39 PM  Group Topic/Focus:  Goals Group:   The focus of this group is to help patients establish daily goals to achieve during treatment and discuss how the patient can incorporate goal setting into their daily lives to aide in recovery.    Participation Level:  Did Not Attend  Participation Quality:  none  Affect:  none  Cognitive:  none  Insight: None  Engagement in Group:  none  Modes of Intervention:  none  Additional Comments:     Elizeth Weinrich 02/28/2024, 8:39 PM

## 2024-02-28 NOTE — Group Note (Signed)
 Date:  02/28/2024 Time:  10:07 AM  Group Topic/Focus:  Coping With Mental Health Crisis:   The purpose of this group is to help patients identify strategies for coping with mental health crisis.  Group discusses possible causes of crisis and ways to manage them effectively.    Participation Level:  Did Not Attend  Leigh VEAR Pais 02/28/2024, 10:07 AM

## 2024-02-28 NOTE — Progress Notes (Signed)
   02/27/24 2045  Psych Admission Type (Psych Patients Only)  Admission Status Voluntary/72 hour document signed  Date 72 hour document signed  02/26/24  Time 72 hour document signed  1030  Psychosocial Assessment  Patient Complaints None  Eye Contact Brief  Facial Expression Flat  Affect Blunted  Speech Logical/coherent  Interaction Guarded;Minimal  Motor Activity Slow  Appearance/Hygiene Unremarkable  Behavior Characteristics Guarded  Mood Pleasant  Aggressive Behavior  Effect No apparent injury  Thought Process  Coherency WDL  Content WDL  Delusions None reported or observed  Perception WDL  Hallucination None reported or observed  Judgment Poor  Confusion WDL  Danger to Self  Current suicidal ideation? Denies  Self-Injurious Behavior No self-injurious ideation or behavior indicators observed or expressed   Danger to Others  Danger to Others None reported or observed

## 2024-02-28 NOTE — [facility] Counselor (Signed)
 CSW was informed that pt was interested in substance use treatment if it was provided virtually. CSW reached out to the following resources to see if they accept his insurance without giving any personal identifying information. CSW received the following information:   [name]: Does not accept Medicaid in [place].   [facility]: Does not accept Medicaid or Medicare.  [facility]: Does not accept Medicaid.   [facility]: only provide MAT services for [facility], no therapy.   Edward Shelton. Chaim, MSW, LCSW, LCAS 02/28/2024 3:34 PM

## 2024-02-28 NOTE — Group Note (Signed)
 Date:  02/28/2024 Time:  1:56 AM  Group Topic/Focus:  Emotional Education:   The focus of this group is to discuss what feelings/emotions are, and how they are experienced. Wrap-Up Group:   The focus of this group is to help patients review their daily goal of treatment and discuss progress on daily workbooks.    Participation Level:  Did Not Attend    Edward Shelton 02/28/2024, 1:56 AM

## 2024-02-29 MED ORDER — ARIPIPRAZOLE 10 MG PO TABS: 10.0000 mg | ORAL_TABLET | Freq: Every day | ORAL | 0 refills | Status: AC

## 2024-02-29 MED ORDER — FOLIC ACID 1 MG PO TABS: 1.0000 mg | ORAL_TABLET | Freq: Every day | ORAL | 0 refills | Status: AC

## 2024-02-29 MED ORDER — NICOTINE POLACRILEX 2 MG MT GUM: 2.0000 mg | CHEWING_GUM | OROMUCOSAL | 0 refills | Status: AC | PRN

## 2024-02-29 MED ORDER — FLUOXETINE HCL 20 MG PO CAPS: 20.0000 mg | ORAL_CAPSULE | Freq: Every day | ORAL | 0 refills | Status: AC

## 2024-02-29 NOTE — Progress Notes (Signed)
   02/29/24 1000  Psych Admission Type (Psych Patients Only)  Admission Status Voluntary/72 hour document signed  Date 72 hour document signed  02/26/24  Time 72 hour document signed  1030  Psychosocial Assessment  Patient Complaints Isolation;None  Eye Contact Brief  Facial Expression Flat  Affect Blunted  Speech Logical/coherent  Interaction Guarded  Motor Activity Slow  Appearance/Hygiene Unremarkable  Behavior Characteristics Cooperative;Calm;Guarded  Mood Pleasant  Aggressive Behavior  Effect No apparent injury  Thought Process  Coherency WDL  Content WDL  Delusions None reported or observed  Perception WDL  Hallucination None reported or observed  Judgment Poor  Confusion WDL  Danger to Self  Current suicidal ideation? Denies  Agreement Not to Harm Self Yes  Description of Agreement Verbal  Danger to Others  Danger to Others None reported or observed

## 2024-02-29 NOTE — Plan of Care (Signed)
  Problem: Education: Goal: Knowledge of [facility] Education information/materials will improve 02/29/2024 1052 by Shirley Jon FALCON, RN Outcome: Completed/Met 02/29/2024 1052 by Shirley Jon FALCON, RN Outcome: Adequate for Discharge Goal: Emotional status will improve 02/29/2024 1052 by Shirley Jon FALCON, RN Outcome: Completed/Met 02/29/2024 1052 by Shirley Jon FALCON, RN Outcome: Adequate for Discharge Goal: Mental status will improve 02/29/2024 1052 by Shirley Jon FALCON, RN Outcome: Completed/Met 02/29/2024 1052 by Shirley Jon FALCON, RN Outcome: Adequate for Discharge Goal: Verbalization of understanding the information provided will improve 02/29/2024 1052 by Shirley Jon FALCON, RN Outcome: Completed/Met 02/29/2024 1052 by Shirley Jon FALCON, RN Outcome: Adequate for Discharge   Problem: Activity: Goal: Interest or engagement in activities will improve 02/29/2024 1052 by Shirley Jon FALCON, RN Outcome: Completed/Met 02/29/2024 1052 by Shirley Jon FALCON, RN Outcome: Adequate for Discharge Goal: Sleeping patterns will improve 02/29/2024 1052 by Shirley Jon FALCON, RN Outcome: Completed/Met 02/29/2024 1052 by Shirley Jon FALCON, RN Outcome: Adequate for Discharge   Problem: Coping: Goal: Ability to verbalize frustrations and anger appropriately will improve 02/29/2024 1052 by Shirley Jon FALCON, RN Outcome: Completed/Met 02/29/2024 1052 by Shirley Jon FALCON, RN Outcome: Adequate for Discharge Goal: Ability to demonstrate self-control will improve 02/29/2024 1052 by Shirley Jon FALCON, RN Outcome: Completed/Met 02/29/2024 1052 by Shirley Jon FALCON, RN Outcome: Adequate for Discharge   Problem: Health Behavior/Discharge Planning: Goal: Identification of resources available to assist in meeting health care needs will improve 02/29/2024 1052 by Shirley Jon FALCON, RN Outcome: Completed/Met 02/29/2024 1052 by Shirley Jon FALCON, RN Outcome: Adequate for Discharge Goal:  Compliance with treatment plan for underlying cause of condition will improve 02/29/2024 1052 by Shirley Jon FALCON, RN Outcome: Completed/Met 02/29/2024 1052 by Shirley Jon FALCON, RN Outcome: Adequate for Discharge   Problem: Physical Regulation: Goal: Ability to maintain clinical measurements within normal limits will improve 02/29/2024 1052 by Shirley Jon FALCON, RN Outcome: Completed/Met 02/29/2024 1052 by Shirley Jon FALCON, RN Outcome: Adequate for Discharge   Problem: Safety: Goal: Periods of time without injury will increase 02/29/2024 1052 by Shirley Jon FALCON, RN Outcome: Completed/Met 02/29/2024 1052 by Shirley Jon FALCON, RN Outcome: Adequate for Discharge   Problem: Education: Goal: Ability to state activities that reduce stress will improve 02/29/2024 1052 by Shirley Jon FALCON, RN Outcome: Completed/Met 02/29/2024 1052 by Shirley Jon FALCON, RN Outcome: Adequate for Discharge   Problem: Education: Goal: Utilization of techniques to improve thought processes will improve 02/29/2024 1052 by Shirley Jon FALCON, RN Outcome: Completed/Met 02/29/2024 1052 by Shirley Jon FALCON, RN Outcome: Adequate for Discharge Goal: Knowledge of the prescribed therapeutic regimen will improve 02/29/2024 1052 by Shirley Jon FALCON, RN Outcome: Completed/Met 02/29/2024 1052 by Shirley Jon FALCON, RN Outcome: Adequate for Discharge   Problem: Education: Goal: Ability to make informed decisions regarding treatment will improve 02/29/2024 1052 by Shirley Jon FALCON, RN Outcome: Completed/Met 02/29/2024 1052 by Shirley Jon FALCON, RN Outcome: Adequate for Discharge   Problem: Coping: Goal: Coping ability will improve 02/29/2024 1052 by Shirley Jon FALCON, RN Outcome: Completed/Met 02/29/2024 1052 by Shirley Jon FALCON, RN Outcome: Adequate for Discharge

## 2024-02-29 NOTE — [facility] Suicide Risk Assessment (Signed)
 [facility] Discharge Suicide Risk Assessment   Principal Problem: Substance induced mood disorder (HCC) Discharge Diagnoses: Principal Problem:   Substance induced mood disorder (HCC)   Total Time spent with patient: 30 minutes  Musculoskeletal: Strength & Muscle Tone: within normal limits Gait & Station: normal Patient leans: N/A  Psychiatric Specialty Exam  Presentation  General Appearance:  Appropriate for Environment  Eye Contact: Good  Speech: Clear and Coherent  Speech Volume: Normal  Handedness: Right   Mood and Affect  Mood: Euthymic  Duration of Depression Symptoms: No data recorded Affect: Appropriate   Thought Process  Thought Processes: Coherent; Linear  Descriptions of Associations:Intact  Orientation:Full (Time, Place and Person)  Thought Content:Logical  History of Schizophrenia/Schizoaffective disorder:No  Duration of Psychotic Symptoms:No data recorded Hallucinations:Hallucinations: None  Ideas of Reference:None  Suicidal Thoughts:Suicidal Thoughts: No  Homicidal Thoughts:Homicidal Thoughts: No   Sensorium  Memory: Immediate Good; Recent Good  Judgment: Fair  Insight: Fair   Art Therapist  Concentration: Good  Attention Span: Good  Recall: Good  Fund of Knowledge: Fair  Language: Fair   Psychomotor Activity  Psychomotor Activity: Psychomotor Activity: Normal   Assets  Assets: Communication Skills; Desire for Improvement; Housing; Social Support   Sleep  Sleep: Sleep: Good  Estimated Sleeping Duration (Last 24 Hours): 10.75-13.75 hours  Physical Exam: Physical Exam ROS Blood pressure 115/73, pulse 69, temperature 98.6 F (37 C), temperature source Oral, resp. rate 16, height 5' 3 (1.6 m), weight 58.1 kg, SpO2 100%. Body mass index is 22.67 kg/m.  Mental Status Per Nursing Assessment::   On Admission:  Suicidal ideation indicated by patient  Demographic Factors:  Male and Adolescent  or young adult  Loss Factors: NA  Historical Factors: Impulsivity  Risk Reduction Factors:   Employed and Positive social support  Continued Clinical Symptoms:  Alcohol/Substance Abuse/Dependencies  Cognitive Features That Contribute To Risk:  None    Suicide Risk:  Minimal: No identifiable suicidal ideation.  Patients presenting with no risk factors but with morbid ruminations; may be classified as minimal risk based on the severity of the depressive symptoms   Follow-up Information     [facility] Follow up.   Why: Clinic Hours: Monday - Friday: 8:00 a.m. - 5:00 p.m.   Walk-in assessments are available Monday, Wednesday, and Friday from 8:00 a.m. - 3:30 p.m. You do not need an appointment during these times -- new patients may come in for same-day assessment and service connection. After-Hours / Psychiatrist If you experience a mental health or substance use crisis outside normal business hours, you may go to: [facility] ([facility]) [address], [place], [place] [address] Open 24 hours a day, 7 days a week for behavioral health emergencies.   If you feel unsafe, are having thoughts of self-harm, or are in danger, call 911 or go to the nearest emergency department. You can also reach the 988 Suicide & Crisis Lifeline by calling or texting 988 any time. Contact information: [address] [place] [address] [phone]         [facility] Follow up.   Why: Appointment is scheduled for 03/08/24 at 8:45am and it will be a virtual appointment Contact information: [address]  [address] [place] [address] [phone]                 Plan Of Care/Follow-up recommendations:  Activity:  as tolerated  Camelia LITTIE Lukes, PA-C 02/29/2024, 10:35 AM

## 2024-02-29 NOTE — Plan of Care (Signed)
   Problem: Education: Goal: Knowledge of [facility] Education information/materials will improve Outcome: Progressing   Problem: Education: Goal: Emotional status will improve Outcome: Progressing   Problem: Education: Goal: Mental status will improve Outcome: Progressing   Problem: Education: Goal: Verbalization of understanding the information provided will improve Outcome: Progressing   Problem: Activity: Goal: Interest or engagement in activities will improve Outcome: Progressing   Problem: Activity: Goal: Sleeping patterns will improve Outcome: Progressing   Problem: Coping: Goal: Ability to verbalize frustrations and anger appropriately will improve Outcome: Progressing

## 2024-02-29 NOTE — Progress Notes (Signed)
   02/28/24 2100  Psych Admission Type (Psych Patients Only)  Admission Status Voluntary/72 hour document signed  Date 72 hour document signed  02/26/24  Time 72 hour document signed  1030  Psychosocial Assessment  Patient Complaints None;Isolation  Eye Contact Brief  Facial Expression Flat  Affect Blunted  Speech Logical/coherent  Interaction Guarded  Motor Activity Slow  Appearance/Hygiene Unremarkable  Behavior Characteristics Guarded  Mood Pleasant  Aggressive Behavior  Effect No apparent injury  Thought Process  Coherency WDL  Content WDL  Delusions None reported or observed  Perception WDL  Hallucination None reported or observed  Judgment Poor  Confusion WDL  Danger to Self  Current suicidal ideation? Denies  Agreement Not to Harm Self Yes  Description of Agreement verbal  Danger to Others  Danger to Others None reported or observed

## 2024-02-29 NOTE — Discharge Summary (Signed)
 Physician Discharge Summary Note  Patient:  Edward Shelton is an 23 y.o., male MRN:  [id number] DOB:  Jul 22, 2000 Patient phone:  [phone] (home)  Patient address:   [address] [place] [address],   Total time spent: 40 min Date of Admission:  02/25/2024 Date of Discharge: 02/29/2024  Reason for Admission:  Passive SI, depressed mood in context of substance use  Principal Problem: Substance induced mood disorder (HCC) Discharge Diagnoses: Principal Problem:   Substance induced mood disorder (HCC)   Past Psychiatric History: see h&p  Family Psychiatric  History: see h&p Social History:  Social History   Substance and Sexual Activity  Alcohol Use Yes   Alcohol/week: 3.0 standard drinks of alcohol   Types: 3 Cans of beer per week     Social History   Substance and Sexual Activity  Drug Use Yes   Types: Marijuana, IV   Comment: shrooms, MDMA, ketamine    Social History   Socioeconomic History   Marital status: Single    Spouse name: Not on file   Number of children: Not on file   Years of education: Not on file   Highest education level: Not on file  Occupational History   Not on file  Tobacco Use   Smoking status: Every Day    Types: Cigarettes   Smokeless tobacco: Current  Vaping Use   Vaping status: Every Day  Substance and Sexual Activity   Alcohol use: Yes    Alcohol/week: 3.0 standard drinks of alcohol    Types: 3 Cans of beer per week   Drug use: Yes    Types: Marijuana, IV    Comment: shrooms, MDMA, ketamine   Sexual activity: Yes  Other Topics Concern   Not on file  Social History Narrative   Not on file   Social Drivers of Health   Financial Resource Strain: Not on file  Food Insecurity: Patient Declined (02/25/2024)   Hunger Vital Sign    Worried About Running Out of Food in the Last Year: Patient declined    Ran Out of Food in the Last Year: Patient declined  Transportation Needs: No Transportation Needs  (02/25/2024)   PRAPARE - Administrator, Civil Service (Medical): No    Lack of Transportation (Non-Medical): No  Physical Activity: Not on file  Stress: Not on file  Social Connections: Not on file   Past Medical History:  Past Medical History:  Diagnosis Date   Depression    History reviewed. No pertinent surgical history. Family History:  Family History  Problem Relation Age of Onset   Healthy Mother    Healthy Father     Hospital Course:    Throughout the admission, the patient remained calm, cooperative, and fully alert and oriented on evaluation. He responded well to treatment with Abilify  and Prozac  and reported tolerating his medication regimen without adverse effects. He denied symptoms of depression, anxiety, suicidal ideation, homicidal ideation, or hallucinations on serial interviews. He demonstrated insight into substance use as a trigger for psychiatric symptoms and expressed motivation to maintain sobriety. He remained linear and logical in thought processes, and endorsed good sleep and stable appetite. He verbalized future-orientation, including anticipation of starting a new job, and discussed coping skills, available supports, and crisis resources. He denied access to firearms or other lethal means. No behavioral concerns occurred during hospitalization.   Patient is in precontemplative phase of substance use disorder and declined inpatient rehab at this time.  Patient has requested discharge and is not meeting IVC criteria at this time.  He is willing to engage in outpatient psychiatric services, appointments provided, as well as walk-in clinic information.  Patient is not displaying evidence of psychosis and has maintained safe behaviors on the unit.  Given multiple psychiatric hospital readmissions, it was discussed that if patient continues to display similar behaviors we will consider IVC in the future.  Safe discharge planning discussed with patient's  mother, Edward Shelton yesterday with Spanish interpreter utilized.  Patient's mother does not feel patient is at risk to harm self or others.  She confirms patient will be returning to his home upon discharge and does not have access to guns or other lethal means.  Detailed risk assessment is complete based on clinical exam and individual risk factors and acute suicide risk is low and acute violence risk is low.    On the day of discharge, patient denies SI/HI/plan and denies hallucinations.  Patient remains future oriented and is willing to participate in outpatient mental health services.  Currently, all modifiable risk of harm to self/harm to others have been addressed and patient is no longer appropriate for the acute inpatient setting and is able to continue treatment for mental health needs in the community with the supports as indicated below.  Patient is educated and verbalized understanding of discharge plan of care including medications, follow-up appointments, mental health resources and further crisis services in the community.  He is instructed to call 911 or present to the nearest emergency room should he experience any decompensation in mood, disturbance of bowel or return of suicidal/homicidal ideations.  Patient verbalizes understanding of this education and agrees to this plan of care  Physical Findings: AIMS:  , ,  ,  ,    CIWA:    COWS:      Psychiatric Specialty Exam:  Presentation  General Appearance:  Appropriate for Environment  Eye Contact: Good  Speech: Clear and Coherent  Speech Volume: Normal    Mood and Affect  Mood: Euthymic  Affect: Appropriate   Thought Process  Thought Processes: Coherent; Linear  Descriptions of Associations:Intact  Orientation:Full (Time, Place and Person)  Thought Content:Logical  Hallucinations:Hallucinations: None  Ideas of Reference:None  Suicidal Thoughts:Suicidal Thoughts: No  Homicidal  Thoughts:Homicidal Thoughts: No   Sensorium  Memory: Immediate Good; Recent Good  Judgment: Fair  Insight: Fair   Art Therapist  Concentration: Good  Attention Span: Good  Recall: Good  Fund of Knowledge: Fair  Language: Fair   Psychomotor Activity  Psychomotor Activity: Psychomotor Activity: Normal  Musculoskeletal: Strength & Muscle Tone: within normal limits Gait & Station: normal Assets  Assets: Manufacturing Systems Engineer; Desire for Improvement; Housing; Social Support   Sleep  Sleep: Sleep: Good    Physical Exam: Physical Exam Vitals and nursing note reviewed.  Constitutional:      Appearance: Normal appearance.  HENT:     Head: Normocephalic and atraumatic.     Nose: Nose normal.  Eyes:     Conjunctiva/sclera: Conjunctivae normal.  Pulmonary:     Effort: Pulmonary effort is normal.  Neurological:     Mental Status: He is alert and oriented to person, place, and time.  Psychiatric:        Attention and Perception: Attention and perception normal. He does not perceive auditory or visual hallucinations.        Mood and Affect: Mood and affect normal.        Speech: Speech normal.  Behavior: Behavior is cooperative.        Thought Content: Thought content normal. Thought content is not paranoid or delusional. Thought content does not include homicidal or suicidal ideation. Thought content does not include homicidal or suicidal plan.        Cognition and Memory: Cognition normal.        Judgment: Judgment normal.    Review of Systems  Psychiatric/Behavioral:  Positive for substance abuse. Negative for depression, hallucinations and suicidal ideas. The patient is not nervous/anxious and does not have insomnia.    Blood pressure 115/73, pulse 69, temperature 98.6 F (37 C), temperature source Oral, resp. rate 16, height 5' 3 (1.6 m), weight 58.1 kg, SpO2 100%. Body mass index is 22.67 kg/m.   Social History   Tobacco Use   Smoking Status Every Day   Types: Cigarettes  Smokeless Tobacco Current   Tobacco Cessation:  A prescription for an FDA-approved tobacco cessation medication provided at discharge   Blood Alcohol level:  Lab Results  Component Value Date   ETH 293 (H) 02/24/2024   ETH <15 02/17/2024    Metabolic Disorder Labs:  No results found for: HGBA1C, MPG No results found for: PROLACTIN No results found for: CHOL, TRIG, HDL, CHOLHDL, VLDL, LDLCALC  See Psychiatric Specialty Exam and Suicide Risk Assessment completed by Attending Physician prior to discharge.  Discharge destination:  Home  Is patient on multiple antipsychotic therapies at discharge:  No   Has Patient had three or more failed trials of antipsychotic monotherapy by history:  No  Recommended Plan for Multiple Antipsychotic Therapies: NA  Discharge Instructions     Diet - low sodium heart healthy   Complete by: As directed    Increase activity slowly   Complete by: As directed       Allergies as of 02/29/2024   No Known Allergies      Medication List     STOP taking these medications    guanFACINE  2 MG Tb24 ER tablet Commonly known as: INTUNIV    hydrOXYzine  25 MG capsule Commonly known as: VISTARIL    mirtazapine  15 MG tablet Commonly known as: REMERON    traZODone  50 MG tablet Commonly known as: DESYREL        TAKE these medications      Indication  ARIPiprazole  10 MG tablet Commonly known as: ABILIFY  Take 1 tablet (10 mg total) by mouth daily. What changed:  medication strength See the new instructions.  Indication: Mood disorder   FLUoxetine  20 MG capsule Commonly known as: PROZAC  Take 1 capsule (20 mg total) by mouth daily. What changed:  medication strength See the new instructions.  Indication: Depression   folic acid  1 MG tablet Commonly known as: FOLVITE  Take 1 tablet (1 mg total) by mouth daily.  Indication: Folate deficiency   nicotine  polacrilex 2 MG  gum Commonly known as: NICORETTE  Take 1 each (2 mg total) by mouth as needed for smoking cessation.  Indication: Nicotine  Addiction        Follow-up Information     [facility] Follow up.   Why: Clinic Hours: Monday - Friday: 8:00 a.m. - 5:00 p.m.   Walk-in assessments are available Monday, Wednesday, and Friday from 8:00 a.m. - 3:30 p.m. You do not need an appointment during these times -- new patients may come in for same-day assessment and service connection. After-Hours / Psychiatrist If you experience a mental health or substance use crisis outside normal business hours, you may go to:  [facility] ([facility]) [address] [place], [place] [address] Open 24 hours a day, 7 days a week for behavioral health emergencies.   If you feel unsafe, are having thoughts of self-harm, or are in danger, call 911 or go to the nearest emergency department. You can also reach the 988 Suicide & Crisis Lifeline by calling or texting 988 any time. Contact information: [address] [place] [address] [phone]         [facility] Follow up.   Why: Appointment is scheduled for 03/08/24 at 8:45am and it will be a virtual appointment Contact information: [address]  [address] [place] [address] [phone]                 Follow-up recommendations:  Activity:  As tolerated    Signed: Camelia LITTIE Lukes, PA-C 02/29/2024, 10:40 AM

## 2024-02-29 NOTE — Progress Notes (Signed)
  [facility] Adult Case Management Discharge Plan :  Will you be returning to the same living situation after discharge:  Yes,  pt plan to return home upon discharge. At discharge, do you have transportation home?: Yes,  pt received taxi voucher.  Do you have the ability to pay for your medications: Yes,  [place] MEDICAID PREPAID HEALTH PLAN / [place] MEDICAID [facility]  Release of information consent forms completed and in the chart;  Patient's signature needed at discharge.  Patient to Follow up at:  Follow-up Information     [facility] Follow up.   Why: Clinic Hours: Monday - Friday: 8:00 a.m. - 5:00 p.m.   Walk-in assessments are available Monday, Wednesday, and Friday from 8:00 a.m. - 3:30 p.m. You do not need an appointment during these times -- new patients may come in for same-day assessment and service connection. After-Hours / Psychiatrist If you experience a mental health or substance use crisis outside normal business hours, you may go to: [facility] ([facility]) [address], [place], [place] [address] Open 24 hours a day, 7 days a week for behavioral health emergencies.   If you feel unsafe, are having thoughts of self-harm, or are in danger, call 911 or go to the nearest emergency department. You can also reach the 988 Suicide & Crisis Lifeline by calling or texting 988 any time. Contact information: [address] [place] [address] [phone]         [facility] Follow up.   Why: Appointment is scheduled for 03/08/24 at 8:45am and it will be a virtual appointment Contact information: [address]  [address] [place] [address] [phone]                 Next level of care provider has access to [facility] Link:no  Safety Planning and Suicide Prevention discussed: Yes,  SPE completed with mother, [place]end[place] Jubilee.      Has patient been referred to the Quitline?: Patient refused referral for  treatment  Patient has been referred for addiction treatment: Patient refused referral for treatment.  Nadara JONELLE Fam, LCSW 02/29/2024, 9:50 AM

## 2024-02-29 NOTE — Plan of Care (Signed)
  Problem: Education: Goal: Knowledge of [facility] Education information/materials will improve Outcome: Adequate for Discharge Goal: Emotional status will improve Outcome: Adequate for Discharge Goal: Mental status will improve Outcome: Adequate for Discharge Goal: Verbalization of understanding the information provided will improve Outcome: Adequate for Discharge   Problem: Activity: Goal: Interest or engagement in activities will improve Outcome: Adequate for Discharge Goal: Sleeping patterns will improve Outcome: Adequate for Discharge   Problem: Coping: Goal: Ability to verbalize frustrations and anger appropriately will improve Outcome: Adequate for Discharge Goal: Ability to demonstrate self-control will improve Outcome: Adequate for Discharge   Problem: Health Behavior/Discharge Planning: Goal: Identification of resources available to assist in meeting health care needs will improve Outcome: Adequate for Discharge Goal: Compliance with treatment plan for underlying cause of condition will improve Outcome: Adequate for Discharge   Problem: Physical Regulation: Goal: Ability to maintain clinical measurements within normal limits will improve Outcome: Adequate for Discharge   Problem: Safety: Goal: Periods of time without injury will increase Outcome: Adequate for Discharge   Problem: Education: Goal: Ability to state activities that reduce stress will improve Outcome: Adequate for Discharge   Problem: Education: Goal: Utilization of techniques to improve thought processes will improve Outcome: Adequate for Discharge Goal: Knowledge of the prescribed therapeutic regimen will improve Outcome: Adequate for Discharge   Problem: Education: Goal: Ability to make informed decisions regarding treatment will improve Outcome: Adequate for Discharge   Problem: Coping: Goal: Coping ability will improve Outcome: Adequate for Discharge
